# Patient Record
Sex: Female | Born: 1940
Health system: Southern US, Community
[De-identification: ages and names within clinical notes are randomized; demographics above are authoritative.]

## PROBLEM LIST (undated history)

## (undated) ENCOUNTER — Emergency Department (HOSPITAL_COMMUNITY)

## (undated) DIAGNOSIS — Z973 Presence of spectacles and contact lenses: Secondary | ICD-10-CM

## (undated) DIAGNOSIS — R112 Nausea with vomiting, unspecified: Secondary | ICD-10-CM

## (undated) DIAGNOSIS — T8859XA Other complications of anesthesia, initial encounter: Secondary | ICD-10-CM

## (undated) DIAGNOSIS — Z972 Presence of dental prosthetic device (complete) (partial): Secondary | ICD-10-CM

## (undated) DIAGNOSIS — M1990 Unspecified osteoarthritis, unspecified site: Secondary | ICD-10-CM

## (undated) DIAGNOSIS — T4145XA Adverse effect of unspecified anesthetic, initial encounter: Secondary | ICD-10-CM

## (undated) DIAGNOSIS — Z9889 Other specified postprocedural states: Secondary | ICD-10-CM

## (undated) DIAGNOSIS — I10 Essential (primary) hypertension: Secondary | ICD-10-CM

## (undated) HISTORY — PX: ABDOMINAL HYSTERECTOMY: SHX81

## (undated) HISTORY — PX: MULTIPLE TOOTH EXTRACTIONS: SHX2053

## (undated) HISTORY — PX: CHOLECYSTECTOMY: SHX55

## (undated) HISTORY — PX: DILATION AND CURETTAGE OF UTERUS: SHX78

## (undated) HISTORY — PX: APPENDECTOMY: SHX54

## (undated) HISTORY — PX: CATARACT EXTRACTION W/ INTRAOCULAR LENS  IMPLANT, BILATERAL: SHX1307

---

## 1998-10-29 ENCOUNTER — Ambulatory Visit: Admission: RE | Admit: 1998-10-29 | Discharge: 1998-10-29 | Payer: Self-pay | Admitting: *Deleted

## 1998-10-29 ENCOUNTER — Encounter: Payer: Self-pay | Admitting: *Deleted

## 1998-11-09 ENCOUNTER — Encounter: Admission: RE | Admit: 1998-11-09 | Discharge: 1998-11-19 | Payer: Self-pay | Admitting: *Deleted

## 1999-08-23 ENCOUNTER — Encounter: Payer: Self-pay | Admitting: Obstetrics and Gynecology

## 1999-08-23 ENCOUNTER — Encounter: Admission: RE | Admit: 1999-08-23 | Discharge: 1999-08-23 | Payer: Self-pay | Admitting: Obstetrics and Gynecology

## 1999-08-26 ENCOUNTER — Other Ambulatory Visit: Admission: RE | Admit: 1999-08-26 | Discharge: 1999-08-26 | Payer: Self-pay | Admitting: Obstetrics and Gynecology

## 2000-08-23 ENCOUNTER — Encounter: Admission: RE | Admit: 2000-08-23 | Discharge: 2000-08-23 | Payer: Self-pay | Admitting: Obstetrics and Gynecology

## 2000-08-23 ENCOUNTER — Encounter: Payer: Self-pay | Admitting: Obstetrics and Gynecology

## 2000-08-24 ENCOUNTER — Other Ambulatory Visit: Admission: RE | Admit: 2000-08-24 | Discharge: 2000-08-24 | Payer: Self-pay | Admitting: Obstetrics and Gynecology

## 2001-10-10 ENCOUNTER — Other Ambulatory Visit: Admission: RE | Admit: 2001-10-10 | Discharge: 2001-10-10 | Payer: Self-pay | Admitting: *Deleted

## 2002-07-19 ENCOUNTER — Ambulatory Visit (HOSPITAL_COMMUNITY): Admission: RE | Admit: 2002-07-19 | Discharge: 2002-07-19 | Payer: Self-pay | Admitting: Family Medicine

## 2008-06-30 ENCOUNTER — Emergency Department (HOSPITAL_COMMUNITY): Admission: EM | Admit: 2008-06-30 | Discharge: 2008-06-30 | Payer: Self-pay | Admitting: Emergency Medicine

## 2009-07-22 DIAGNOSIS — M129 Arthropathy, unspecified: Secondary | ICD-10-CM | POA: Insufficient documentation

## 2009-07-22 DIAGNOSIS — K829 Disease of gallbladder, unspecified: Secondary | ICD-10-CM | POA: Insufficient documentation

## 2009-07-22 DIAGNOSIS — I10 Essential (primary) hypertension: Secondary | ICD-10-CM | POA: Insufficient documentation

## 2010-01-08 ENCOUNTER — Encounter: Admission: RE | Admit: 2010-01-08 | Discharge: 2010-01-08 | Payer: Self-pay | Admitting: Internal Medicine

## 2013-03-21 ENCOUNTER — Emergency Department (HOSPITAL_COMMUNITY)
Admission: EM | Admit: 2013-03-21 | Discharge: 2013-03-21 | Disposition: A | Discharge status: Home / Self Care | Payer: Medicare Other | Attending: Emergency Medicine | Admitting: Emergency Medicine

## 2013-03-21 ENCOUNTER — Emergency Department (HOSPITAL_COMMUNITY): Payer: Medicare Other

## 2013-03-21 ENCOUNTER — Encounter (HOSPITAL_COMMUNITY): Payer: Self-pay

## 2013-03-21 DIAGNOSIS — Y9289 Other specified places as the place of occurrence of the external cause: Secondary | ICD-10-CM | POA: Insufficient documentation

## 2013-03-21 DIAGNOSIS — IMO0002 Reserved for concepts with insufficient information to code with codable children: Secondary | ICD-10-CM | POA: Insufficient documentation

## 2013-03-21 DIAGNOSIS — Z87891 Personal history of nicotine dependence: Secondary | ICD-10-CM | POA: Insufficient documentation

## 2013-03-21 DIAGNOSIS — I10 Essential (primary) hypertension: Secondary | ICD-10-CM | POA: Insufficient documentation

## 2013-03-21 DIAGNOSIS — S76312A Strain of muscle, fascia and tendon of the posterior muscle group at thigh level, left thigh, initial encounter: Secondary | ICD-10-CM

## 2013-03-21 DIAGNOSIS — X500XXA Overexertion from strenuous movement or load, initial encounter: Secondary | ICD-10-CM | POA: Insufficient documentation

## 2013-03-21 DIAGNOSIS — Z79899 Other long term (current) drug therapy: Secondary | ICD-10-CM | POA: Insufficient documentation

## 2013-03-21 DIAGNOSIS — Y9389 Activity, other specified: Secondary | ICD-10-CM | POA: Insufficient documentation

## 2013-03-21 HISTORY — DX: Essential (primary) hypertension: I10

## 2013-03-21 MED ORDER — HYDROCODONE-ACETAMINOPHEN 5-325 MG PO TABS: 1.0000 | ORAL_TABLET | Freq: Four times a day (QID) | ORAL | Status: DC | PRN

## 2013-03-21 NOTE — ED Notes (Signed)
Patient transported to X-ray 

## 2013-03-21 NOTE — ED Notes (Addendum)
Pt c/o L hip pain.  Denies injury.  Sts she was moving something on her porch and felt a "pop in the lower part of hip."  Pain score 8/10.  Vitals are stable.  NAD noted.  Pt ambulated to room w/o difficulty.

## 2013-03-21 NOTE — ED Notes (Signed)
It is noted that the area which the Pt is c/o pain is hamstring/back of thigh area.

## 2013-03-21 NOTE — ED Notes (Signed)
Pt escorted to discharge window. Verbalized understanding discharge instructions. In no acute distress.   

## 2013-03-21 NOTE — Progress Notes (Signed)
Pt confirms pcp is Barbara Soto EPIC updated

## 2013-03-21 NOTE — ED Provider Notes (Signed)
CSN: [id number]     Arrival date & time 03/21/13  1109 History     First MD Initiated Contact with Patient 03/21/13 1119     Chief Complaint  Patient presents with  . Hip Pain   (Consider location/radiation/quality/duration/timing/severity/associated sxs/prior Treatment) HPI This is a 72 year old female with a history of hypertension who presents with left leg pain. The patient cleans houses for a living. She states yesterday she was in her backyard when she bent over and lifted a heavy bucket.  She reports feeling a "pop" in her left leg. Patient has been ambulatory. She took Aleve last night with some improvement of her pain. Patient denies any other injury at this time. She denies any fevers, chest pain, shortness of breath, abdominal pain, urinary symptoms, focal weakness or numbness.  She denies other injury. Past Medical History  Diagnosis Date  . Hypertension    Past Surgical History  Procedure Laterality Date  . Abdominal hysterectomy    . Cholecystectomy     History reviewed. No pertinent family history. History  Substance Use Topics  . Smoking status: Former Games developer  . Smokeless tobacco: Not on file  . Alcohol Use: No   OB History   Grav Para Term Preterm Abortions TAB SAB Ect Mult Living                 Review of Systems  Constitutional: Negative for fever.  Respiratory: Negative for shortness of breath.   Cardiovascular: Negative for chest pain.  Gastrointestinal: Negative for abdominal pain.  Genitourinary: Negative for dysuria.  Musculoskeletal: Negative for back pain.       Pain to the posterior upper leg  Skin: Negative for wound.  Neurological: Negative for headaches.  All other systems reviewed and are negative.    Allergies  Review of patient's allergies indicates no known allergies.  Home Medications   Current Outpatient Rx  Name  Route  Sig  Dispense  Refill  . ibuprofen (ADVIL,MOTRIN) 200 MG tablet   Oral   Take 400 mg by mouth every 8  (eight) hours as needed for pain.         Marland Kitchen lisinopril (PRINIVIL,ZESTRIL) 10 MG tablet   Oral   Take 10 mg by mouth as directed. Written daily, but patient says she takes it "a couple times a week".         Marland Kitchen HYDROcodone-acetaminophen (NORCO/VICODIN) 5-325 MG per tablet   Oral   Take 1 tablet by mouth every 6 (six) hours as needed for pain.   10 tablet   0    BP 159/64  Pulse 77  Temp(Src) 98.5 F (36.9 C)  Resp 16  SpO2 98% Physical Exam  Nursing note and vitals reviewed. Constitutional: She is oriented to person, place, and time. She appears well-developed and well-nourished. No distress.  HENT:  Head: Normocephalic and atraumatic.  Neck: Neck supple.  Cardiovascular: Normal rate, regular rhythm and normal heart sounds.   Pulmonary/Chest: Effort normal and breath sounds normal. No respiratory distress.  Abdominal: Soft. Bowel sounds are normal. There is no tenderness.  Musculoskeletal:  Focused examination of the left lower ext reveals no obvious deformity.  There is no leg shortening. Patient has tenderness to palpation over the hamstring without mass noted. Patient is able to fire her quad. Has full range of motion at the hip and the knee.  Neurovascularly intact distally.  Neurological: She is alert and oriented to person, place, and time.  Skin: Skin is warm and dry.  Psychiatric: She has a normal mood and affect.    ED Course   Procedures (including critical care time)  Labs Reviewed - No data to display Dg Hip Complete Left  03/21/2013   *RADIOLOGY REPORT*  Clinical Data: Pain  LEFT HIP - COMPLETE 2+ VIEW  Comparison: None.  Findings: Frontal pelvis as well as frontal and lateral hip images were obtained.  There is moderate symmetric narrowing both hip joints.  No fracture or dislocation.  No erosive change.  IMPRESSION:   Moderate osteoarthritic change in both hip joints. No fracture or dislocation.   Original Report Authenticated By: Bretta Bang, M.D.    Dg Femur Left  03/21/2013   *RADIOLOGY REPORT*  Clinical Data: Felt pop yesterday.  Left hip pain extending down the left thigh.  LEFT FEMUR - 2 VIEW  Comparison: None.  Findings: No fracture or dislocation.  Hip joint and the left knee degenerative changes.  Evaluation slightly limited by patient's habitus.  IMPRESSION: No fracture.  Please see above.   Original Report Authenticated By: Lacy Duverney, M.D.   1. Hamstring muscle strain, left, initial encounter     MDM  This is a 72 year old female who presents with left leg pain. She is nontoxic-appearing on exam and vital signs are notable for a blood pressure 159/64. Patient has full range of motion of the left lower extremity at the hip and the knee. She has tenderness to palpation over the hamstring without mass. I suspect this is a muscle strain given the mechanism of injury. Plain films were obtained to rule out fracture. Clear negative. The patient will be given a short course of Norco for pain management at home. She takes Aleve when necessary and was encouraged to continue this for anti-inflammatory effect. She was given precaution given her age that NSAID use can cause GI upset , GI bleed, or renal impairment. She stated understanding. The patient does not have a primary care physician and does not wish to followup with one. She was given the contact information for the ER she is to have any further issues. The patient was also given exercises and stretches for rehabilitation purposes.  After history, exam, and medical workup I feel the patient has been appropriately medically screened and is safe for discharge home. Pertinent diagnoses were discussed with the patient. Patient was given return precautions.   Shon Baton, MD 03/21/13 [phone]

## 2013-03-21 NOTE — ED Notes (Signed)
MD at bedside. 

## 2013-06-06 ENCOUNTER — Other Ambulatory Visit: Payer: Self-pay | Admitting: Nurse Practitioner

## 2013-06-06 ENCOUNTER — Ambulatory Visit
Admission: RE | Admit: 2013-06-06 | Discharge: 2013-06-06 | Disposition: A | Discharge status: Home / Self Care | Payer: Medicare Other | Source: Ambulatory Visit | Attending: Nurse Practitioner | Admitting: Nurse Practitioner

## 2013-06-06 DIAGNOSIS — R05 Cough: Secondary | ICD-10-CM

## 2013-07-12 ENCOUNTER — Other Ambulatory Visit: Payer: Self-pay | Admitting: Orthopedic Surgery

## 2013-07-12 DIAGNOSIS — M545 Low back pain: Secondary | ICD-10-CM

## 2013-07-12 DIAGNOSIS — M25551 Pain in right hip: Secondary | ICD-10-CM

## 2013-07-20 ENCOUNTER — Ambulatory Visit
Admission: RE | Admit: 2013-07-20 | Discharge: 2013-07-20 | Disposition: A | Discharge status: Home / Self Care | Payer: Medicare Other | Source: Ambulatory Visit | Attending: Orthopedic Surgery | Admitting: Orthopedic Surgery

## 2013-07-20 DIAGNOSIS — M25551 Pain in right hip: Secondary | ICD-10-CM

## 2013-07-20 DIAGNOSIS — M545 Low back pain: Secondary | ICD-10-CM

## 2013-10-28 ENCOUNTER — Ambulatory Visit
Admission: RE | Admit: 2013-10-28 | Discharge: 2013-10-28 | Disposition: A | Discharge status: Home / Self Care | Payer: Medicare Other | Source: Ambulatory Visit | Attending: Nurse Practitioner | Admitting: Nurse Practitioner

## 2013-10-28 ENCOUNTER — Other Ambulatory Visit: Payer: Self-pay | Admitting: Nurse Practitioner

## 2013-10-28 DIAGNOSIS — M2560 Stiffness of unspecified joint, not elsewhere classified: Secondary | ICD-10-CM

## 2014-03-11 ENCOUNTER — Ambulatory Visit: Payer: Self-pay | Admitting: Gynecology

## 2015-08-18 DIAGNOSIS — D485 Neoplasm of uncertain behavior of skin: Secondary | ICD-10-CM | POA: Diagnosis not present

## 2015-08-18 DIAGNOSIS — L820 Inflamed seborrheic keratosis: Secondary | ICD-10-CM | POA: Diagnosis not present

## 2015-08-18 DIAGNOSIS — B079 Viral wart, unspecified: Secondary | ICD-10-CM | POA: Diagnosis not present

## 2015-09-23 DIAGNOSIS — H1013 Acute atopic conjunctivitis, bilateral: Secondary | ICD-10-CM | POA: Diagnosis not present

## 2015-09-23 DIAGNOSIS — H35373 Puckering of macula, bilateral: Secondary | ICD-10-CM | POA: Diagnosis not present

## 2015-09-23 DIAGNOSIS — H524 Presbyopia: Secondary | ICD-10-CM | POA: Diagnosis not present

## 2015-09-23 DIAGNOSIS — Z961 Presence of intraocular lens: Secondary | ICD-10-CM | POA: Diagnosis not present

## 2015-09-23 DIAGNOSIS — H35362 Drusen (degenerative) of macula, left eye: Secondary | ICD-10-CM | POA: Diagnosis not present

## 2015-10-19 DIAGNOSIS — Z1231 Encounter for screening mammogram for malignant neoplasm of breast: Secondary | ICD-10-CM | POA: Diagnosis not present

## 2015-12-01 DIAGNOSIS — Z0000 Encounter for general adult medical examination without abnormal findings: Secondary | ICD-10-CM | POA: Diagnosis not present

## 2015-12-02 DIAGNOSIS — R7301 Impaired fasting glucose: Secondary | ICD-10-CM | POA: Diagnosis not present

## 2015-12-02 DIAGNOSIS — Z136 Encounter for screening for cardiovascular disorders: Secondary | ICD-10-CM | POA: Diagnosis not present

## 2015-12-02 DIAGNOSIS — R8290 Unspecified abnormal findings in urine: Secondary | ICD-10-CM | POA: Diagnosis not present

## 2016-05-25 DIAGNOSIS — E113291 Type 2 diabetes mellitus with mild nonproliferative diabetic retinopathy without macular edema, right eye: Secondary | ICD-10-CM | POA: Diagnosis not present

## 2016-05-25 DIAGNOSIS — E113212 Type 2 diabetes mellitus with mild nonproliferative diabetic retinopathy with macular edema, left eye: Secondary | ICD-10-CM | POA: Diagnosis not present

## 2016-05-25 DIAGNOSIS — H401131 Primary open-angle glaucoma, bilateral, mild stage: Secondary | ICD-10-CM | POA: Diagnosis not present

## 2016-06-02 DIAGNOSIS — M545 Low back pain: Secondary | ICD-10-CM | POA: Diagnosis not present

## 2016-06-02 DIAGNOSIS — Z1211 Encounter for screening for malignant neoplasm of colon: Secondary | ICD-10-CM | POA: Diagnosis not present

## 2016-06-02 DIAGNOSIS — G4700 Insomnia, unspecified: Secondary | ICD-10-CM | POA: Diagnosis not present

## 2016-06-02 DIAGNOSIS — M159 Polyosteoarthritis, unspecified: Secondary | ICD-10-CM | POA: Diagnosis not present

## 2016-06-02 DIAGNOSIS — I10 Essential (primary) hypertension: Secondary | ICD-10-CM | POA: Diagnosis not present

## 2016-06-02 DIAGNOSIS — Z1212 Encounter for screening for malignant neoplasm of rectum: Secondary | ICD-10-CM | POA: Diagnosis not present

## 2016-07-04 ENCOUNTER — Other Ambulatory Visit: Payer: Self-pay | Admitting: Pharmacist Clinician (PhC)/ Clinical Pharmacy Specialist

## 2016-08-22 DIAGNOSIS — L309 Dermatitis, unspecified: Secondary | ICD-10-CM | POA: Diagnosis not present

## 2016-08-22 DIAGNOSIS — L720 Epidermal cyst: Secondary | ICD-10-CM | POA: Diagnosis not present

## 2016-10-19 DIAGNOSIS — Z1231 Encounter for screening mammogram for malignant neoplasm of breast: Secondary | ICD-10-CM | POA: Diagnosis not present

## 2016-10-25 DIAGNOSIS — G894 Chronic pain syndrome: Secondary | ICD-10-CM | POA: Diagnosis not present

## 2016-10-25 DIAGNOSIS — G4700 Insomnia, unspecified: Secondary | ICD-10-CM | POA: Diagnosis not present

## 2016-10-25 DIAGNOSIS — I10 Essential (primary) hypertension: Secondary | ICD-10-CM | POA: Diagnosis not present

## 2016-10-25 DIAGNOSIS — M159 Polyosteoarthritis, unspecified: Secondary | ICD-10-CM | POA: Diagnosis not present

## 2016-10-25 DIAGNOSIS — R7301 Impaired fasting glucose: Secondary | ICD-10-CM | POA: Diagnosis not present

## 2016-11-03 DIAGNOSIS — H26493 Other secondary cataract, bilateral: Secondary | ICD-10-CM | POA: Diagnosis not present

## 2016-11-03 DIAGNOSIS — H35373 Puckering of macula, bilateral: Secondary | ICD-10-CM | POA: Diagnosis not present

## 2016-11-03 DIAGNOSIS — H35362 Drusen (degenerative) of macula, left eye: Secondary | ICD-10-CM | POA: Diagnosis not present

## 2016-11-03 DIAGNOSIS — Z961 Presence of intraocular lens: Secondary | ICD-10-CM | POA: Diagnosis not present

## 2016-12-14 ENCOUNTER — Encounter: Payer: Self-pay | Admitting: Gynecology

## 2017-02-13 DIAGNOSIS — M25511 Pain in right shoulder: Secondary | ICD-10-CM | POA: Diagnosis not present

## 2017-02-20 DIAGNOSIS — R0781 Pleurodynia: Secondary | ICD-10-CM | POA: Diagnosis not present

## 2017-03-06 ENCOUNTER — Other Ambulatory Visit: Payer: Self-pay | Admitting: Family Medicine

## 2017-03-06 DIAGNOSIS — R0781 Pleurodynia: Secondary | ICD-10-CM

## 2017-03-20 DIAGNOSIS — M25551 Pain in right hip: Secondary | ICD-10-CM | POA: Diagnosis not present

## 2017-03-27 DIAGNOSIS — D229 Melanocytic nevi, unspecified: Secondary | ICD-10-CM | POA: Diagnosis not present

## 2017-03-27 DIAGNOSIS — L821 Other seborrheic keratosis: Secondary | ICD-10-CM | POA: Diagnosis not present

## 2017-03-27 DIAGNOSIS — L720 Epidermal cyst: Secondary | ICD-10-CM | POA: Diagnosis not present

## 2017-04-11 DIAGNOSIS — M25551 Pain in right hip: Secondary | ICD-10-CM | POA: Diagnosis not present

## 2017-04-27 DIAGNOSIS — L720 Epidermal cyst: Secondary | ICD-10-CM | POA: Diagnosis not present

## 2017-06-06 DIAGNOSIS — M25512 Pain in left shoulder: Secondary | ICD-10-CM | POA: Diagnosis not present

## 2017-07-19 DIAGNOSIS — M1611 Unilateral primary osteoarthritis, right hip: Secondary | ICD-10-CM | POA: Diagnosis not present

## 2017-08-09 ENCOUNTER — Other Ambulatory Visit: Payer: Self-pay | Admitting: Orthopaedic Surgery

## 2017-08-14 ENCOUNTER — Other Ambulatory Visit: Payer: Self-pay | Admitting: Orthopaedic Surgery

## 2017-08-23 NOTE — H&P (Signed)
TOTAL HIP ADMISSION H&P  Patient is admitted for right total hip arthroplasty.  Subjective:  Chief Complaint: right hip pain  HPI: Barbara Soto, 77 y.o. female, has a history of pain and functional disability in the right hip(s) due to arthritis and patient has failed non-surgical conservative treatments for greater than 12 weeks to include NSAID's and/or analgesics, corticosteriod injections, flexibility and strengthening excercises, use of assistive devices, weight reduction as appropriate and activity modification.  Onset of symptoms was gradual starting 5 years ago with gradually worsening course since that time.The patient noted no past surgery on the right hip(s).  Patient currently rates pain in the right hip at 10 out of 10 with activity. Patient has night pain, worsening of pain with activity and weight bearing, trendelenberg gait, pain that interfers with activities of daily living and crepitus. Patient has evidence of subchondral cysts, subchondral sclerosis, periarticular osteophytes and joint space narrowing by imaging studies. This condition presents safety issues increasing the risk of falls. There is no current active infection.  Patient Active Problem List   Diagnosis Date Noted  . HYPERTENSION 07/22/2009  . GALLBLADDER DISEASE 07/22/2009  . ARTHRITIS 07/22/2009   Past Medical History:  Diagnosis Date  . Hypertension     Past Surgical History:  Procedure Laterality Date  . ABDOMINAL HYSTERECTOMY    . CHOLECYSTECTOMY      No current facility-administered medications for this encounter.    Current Outpatient Medications  Medication Sig Dispense Refill Last Dose  . aspirin EC 81 MG tablet Take 81 mg by mouth daily.     Marland Kitchen lisinopril (PRINIVIL,ZESTRIL) 10 MG tablet Take 10 mg by mouth as directed. Written daily, but patient says she takes it "a couple times a week".   Past Week at Unknown  . naproxen sodium (ALEVE) 220 MG tablet Take 440 mg by mouth 3 (three) times daily  as needed (pain).     Marland Kitchen HYDROcodone-acetaminophen (NORCO/VICODIN) 5-325 MG per tablet Take 1 tablet by mouth every 6 (six) hours as needed for pain. (Patient not taking: Reported on 08/21/2017) 10 tablet 0 Not Taking at Unknown time  . ibuprofen (ADVIL,MOTRIN) 200 MG tablet Take 400 mg by mouth every 8 (eight) hours as needed for pain.   03/20/2013 at Unknown   Allergies  Allergen Reactions  . Adhesive [Tape] Other (See Comments)    Takes skin off   . Penicillins Rash    Has patient had a PCN reaction causing immediate rash, facial/tongue/throat swelling, SOB or lightheadedness with hypotension: Yes Has patient had a PCN reaction causing severe rash involving mucus membranes or skin necrosis: No Has patient had a PCN reaction that required hospitalization: No Has patient had a PCN reaction occurring within the last 10 years: No If all of the above answers are "NO", then may proceed with Cephalosporin use.     Social History   Tobacco Use  . Smoking status: Former Smoker  Substance Use Topics  . Alcohol use: No    No family history on file.   Review of Systems  Musculoskeletal: Positive for joint pain.       Right hip  All other systems reviewed and are negative.   Objective:  Physical Exam  Constitutional: She is oriented to person, place, and time. She appears well-developed and well-nourished.  HENT:  Head: Normocephalic and atraumatic.  Eyes: Pupils are equal, round, and reactive to light.  Neck: Normal range of motion.  Cardiovascular: Normal rate and regular rhythm.  Respiratory: Effort  normal.  GI: Soft.  Musculoskeletal:  Right hip motion is minimal and extremely painful.  Her leg looks a little short compared to the opposite side.  She walks with a markedly altered gait.  Her right knee moves fully with no effusion and no joint line pain.  Sensation and motor function are intact in her feet with palpable pulses on both sides.   Neurological: She is alert and oriented  to person, place, and time.  Skin: Skin is warm and dry.  Psychiatric: She has a normal mood and affect. Her behavior is normal. Judgment and thought content normal.    Vital signs in last 24 hours: BP: ()/()  Arterial Line BP: ()/()   Labs:   There is no height or weight on file to calculate BMI.   Imaging Review Plain radiographs demonstrate severe degenerative joint disease of the right hip(s). The bone quality appears to be good for age and reported activity level.  Assessment/Plan:  End stage primary arthritis, right hip(s)  The patient history, physical examination, clinical judgement of the provider and imaging studies are consistent with end stage degenerative joint disease of the right hip(s) and total hip arthroplasty is deemed medically necessary. The treatment options including medical management, injection therapy, arthroscopy and arthroplasty were discussed at length. The risks and benefits of total hip arthroplasty were presented and reviewed. The risks due to aseptic loosening, infection, stiffness, dislocation/subluxation,  thromboembolic complications and other imponderables were discussed.  The patient acknowledged the explanation, agreed to proceed with the plan and consent was signed. Patient is being admitted for inpatient treatment for surgery, pain control, PT, OT, prophylactic antibiotics, VTE prophylaxis, progressive ambulation and ADL's and discharge planning.The patient is planning to be discharged home with home health services

## 2017-08-28 ENCOUNTER — Ambulatory Visit (HOSPITAL_COMMUNITY)
Admission: RE | Admit: 2017-08-28 | Discharge: 2017-08-28 | Disposition: A | Discharge status: Home / Self Care | Payer: PPO | Source: Ambulatory Visit | Attending: Orthopaedic Surgery | Admitting: Orthopaedic Surgery

## 2017-08-28 ENCOUNTER — Encounter (HOSPITAL_COMMUNITY): Payer: Self-pay

## 2017-08-28 ENCOUNTER — Encounter (HOSPITAL_COMMUNITY)
Admission: RE | Admit: 2017-08-28 | Discharge: 2017-08-28 | Disposition: A | Discharge status: Home / Self Care | Payer: PPO | Source: Ambulatory Visit | Attending: Orthopaedic Surgery | Admitting: Orthopaedic Surgery

## 2017-08-28 ENCOUNTER — Other Ambulatory Visit: Payer: Self-pay

## 2017-08-28 DIAGNOSIS — Z7982 Long term (current) use of aspirin: Secondary | ICD-10-CM | POA: Diagnosis not present

## 2017-08-28 DIAGNOSIS — K829 Disease of gallbladder, unspecified: Secondary | ICD-10-CM | POA: Diagnosis not present

## 2017-08-28 DIAGNOSIS — Z01810 Encounter for preprocedural cardiovascular examination: Secondary | ICD-10-CM | POA: Insufficient documentation

## 2017-08-28 DIAGNOSIS — Z96641 Presence of right artificial hip joint: Secondary | ICD-10-CM | POA: Diagnosis not present

## 2017-08-28 DIAGNOSIS — M1611 Unilateral primary osteoarthritis, right hip: Secondary | ICD-10-CM

## 2017-08-28 DIAGNOSIS — Z01818 Encounter for other preprocedural examination: Secondary | ICD-10-CM

## 2017-08-28 DIAGNOSIS — Z880 Allergy status to penicillin: Secondary | ICD-10-CM | POA: Diagnosis not present

## 2017-08-28 DIAGNOSIS — Z01812 Encounter for preprocedural laboratory examination: Secondary | ICD-10-CM | POA: Insufficient documentation

## 2017-08-28 DIAGNOSIS — I444 Left anterior fascicular block: Secondary | ICD-10-CM

## 2017-08-28 DIAGNOSIS — I10 Essential (primary) hypertension: Secondary | ICD-10-CM

## 2017-08-28 DIAGNOSIS — Z96649 Presence of unspecified artificial hip joint: Secondary | ICD-10-CM | POA: Diagnosis not present

## 2017-08-28 DIAGNOSIS — D62 Acute posthemorrhagic anemia: Secondary | ICD-10-CM | POA: Diagnosis not present

## 2017-08-28 DIAGNOSIS — Z87891 Personal history of nicotine dependence: Secondary | ICD-10-CM | POA: Diagnosis not present

## 2017-08-28 DIAGNOSIS — Z471 Aftercare following joint replacement surgery: Secondary | ICD-10-CM | POA: Diagnosis not present

## 2017-08-28 DIAGNOSIS — Z91048 Other nonmedicinal substance allergy status: Secondary | ICD-10-CM | POA: Diagnosis not present

## 2017-08-28 DIAGNOSIS — Z79899 Other long term (current) drug therapy: Secondary | ICD-10-CM | POA: Diagnosis not present

## 2017-08-28 HISTORY — DX: Presence of dental prosthetic device (complete) (partial): Z97.2

## 2017-08-28 HISTORY — DX: Unspecified osteoarthritis, unspecified site: M19.90

## 2017-08-28 HISTORY — DX: Presence of spectacles and contact lenses: Z97.3

## 2017-08-28 LAB — CBC WITH DIFFERENTIAL/PLATELET
BASOS ABS: 0.1 10*3/uL (ref 0.0–0.1)
BASOS PCT: 1 %
Eosinophils Absolute: 0.4 10*3/uL (ref 0.0–0.7)
Eosinophils Relative: 5 %
HEMATOCRIT: 41.1 % (ref 36.0–46.0)
HEMOGLOBIN: 13.6 g/dL (ref 12.0–15.0)
LYMPHS PCT: 28 %
Lymphs Abs: 2.3 10*3/uL (ref 0.7–4.0)
MCH: 30 pg (ref 26.0–34.0)
MCHC: 33.1 g/dL (ref 30.0–36.0)
MCV: 90.7 fL (ref 78.0–100.0)
MONOS PCT: 5 %
Monocytes Absolute: 0.4 10*3/uL (ref 0.1–1.0)
NEUTROS ABS: 5.2 10*3/uL (ref 1.7–7.7)
NEUTROS PCT: 61 %
Platelets: 276 10*3/uL (ref 150–400)
RBC: 4.53 MIL/uL (ref 3.87–5.11)
RDW: 13.6 % (ref 11.5–15.5)
WBC: 8.4 10*3/uL (ref 4.0–10.5)

## 2017-08-28 LAB — PROTIME-INR
INR: 1.07
Prothrombin Time: 13.8 seconds (ref 11.4–15.2)

## 2017-08-28 LAB — URINALYSIS, ROUTINE W REFLEX MICROSCOPIC
Bacteria, UA: NONE SEEN
Bilirubin Urine: NEGATIVE
Glucose, UA: NEGATIVE mg/dL
Hgb urine dipstick: NEGATIVE
Ketones, ur: NEGATIVE mg/dL
Nitrite: NEGATIVE
PH: 7 (ref 5.0–8.0)
Protein, ur: NEGATIVE mg/dL
SPECIFIC GRAVITY, URINE: 1.013 (ref 1.005–1.030)

## 2017-08-28 LAB — BASIC METABOLIC PANEL
ANION GAP: 13 (ref 5–15)
BUN: 12 mg/dL (ref 6–20)
CO2: 22 mmol/L (ref 22–32)
Calcium: 9.3 mg/dL (ref 8.9–10.3)
Chloride: 106 mmol/L (ref 101–111)
Creatinine, Ser: 1.04 mg/dL — ABNORMAL HIGH (ref 0.44–1.00)
GFR calc non Af Amer: 51 mL/min — ABNORMAL LOW (ref >60)
GFR, EST AFRICAN AMERICAN: 59 mL/min — AB (ref >60)
GLUCOSE: 108 mg/dL — AB (ref 65–99)
POTASSIUM: 4.5 mmol/L (ref 3.5–5.1)
Sodium: 141 mmol/L (ref 135–145)

## 2017-08-28 LAB — SURGICAL PCR SCREEN
MRSA, PCR: NEGATIVE
Staphylococcus aureus: POSITIVE — AB

## 2017-08-28 LAB — TYPE AND SCREEN
ABO/RH(D): O POS
Antibody Screen: NEGATIVE

## 2017-08-28 LAB — APTT: aPTT: 32 seconds (ref 24–36)

## 2017-08-28 LAB — ABO/RH: ABO/RH(D): O POS

## 2017-08-28 MED ORDER — TRANEXAMIC ACID 1000 MG/10ML IV SOLN
1000.0000 mg | INTRAVENOUS | Status: AC
  Administered 2017-08-29: 1000 mg via INTRAVENOUS
  Filled 2017-08-28: qty 1100

## 2017-08-28 MED ORDER — LACTATED RINGERS IV SOLN
INTRAVENOUS | Status: DC
  Administered 2017-08-29 (×3): via INTRAVENOUS

## 2017-08-28 MED ORDER — TRANEXAMIC ACID 1000 MG/10ML IV SOLN
2000.0000 mg | INTRAVENOUS | Status: AC
  Administered 2017-08-29: 2000 mg via TOPICAL
  Filled 2017-08-28: qty 20

## 2017-08-28 NOTE — Progress Notes (Signed)
Pt denies SOB, chest pain, and being under the care of a cardiologist. Pt denies having an echo and cardiac cath but stated that a stress test was performed in the "1980's. Pt stated that her last EKG was performed 25 years ago and pt denies having a chest x ray within the last year. Pt denies recent labs. Anesthesia reviewed pt EKG; no new orders given. Left voice message with Sandi Raveling, Surgical Coordinator, to make MD aware that pt UA was abnormal     ( moderate Leukocytes).

## 2017-08-28 NOTE — Pre-Procedure Instructions (Signed)
    FAE BLOSSOM  08/28/2017      [id number] [place], [place] - [address] [place] [address] Phone: [phone] Fax: [phone]    Your procedure is scheduled on Tuesday, August 29, 2017  Report to [facility] Admitting at 10:50 A.M.  Call this number if you have problems the morning of surgery:  [phone]   Remember:  Do not eat food or drink liquids after midnight.  Take these medicines the morning of surgery with A SIP OF WATER: None Stop taking Aspirin, vitamins, fish oil and herbal medications. Do not take any NSAIDs ie: Ibuprofen, Advil, Naproxen (Aleve), Motrin, B and Goody Powder; stop now.  Do not wear jewelry, make-up or nail polish.  Do not wear lotions, powders, or perfumes, or deodorant.  Do not shave 48 hours prior to surgery.    Do not bring valuables to the hospital.  [facility] is not responsible for any belongings or valuables.  Contacts, dentures or bridgework may not be worn into surgery.  Leave your suitcase in the car.  After surgery it may be brought to your room. For patients admitted to the hospital, discharge time will be determined by your treatment team. Special instructions: Shower the night before surgery and the morning of surgery with CHG. Please read over the following fact sheets that you were given. Pain Booklet, Coughing and Deep Breathing, Blood Transfusion Information, Total Joint Packet and Surgical Site Infection Prevention

## 2017-08-28 NOTE — Progress Notes (Signed)
Anesthesia Chart Review:  Pt is a 77 year old female scheduled for R total hip arthroplasty anterior approach on 08/29/2017 with Melrose Nakayama, MD  - No PCP  PMH includes:  HTN. Never smoker. BMI 37.5  Medications include: ASA 81mg , lisinopril (takes prn if having "a stressful day")  BP (!) 170/83   Pulse 93   Temp 36.4 C   Resp 20   Ht 5\' 2"  (1.575 m)   Wt 205 lb (93 kg)   SpO2 98%   BMI 37.49 kg/m   Preoperative labs reviewed.    CXR 08/28/17: No active cardiopulmonary disease.  EKG 08/28/17: Sinus rhythm with PACs. LAFB.  - Pt reports last EKG was 25 years ago.   Pt denied acute CV symptoms at pre-admission testing.   Reviewed case with Dr. Gifford Shave.   If no changes, I anticipate pt can proceed with surgery as scheduled.   Willeen Cass, FNP-BC [facility]/Anesthesiology Phone: [phone] 08/28/2017 3:51 PM

## 2017-08-29 ENCOUNTER — Encounter (HOSPITAL_COMMUNITY): Payer: Self-pay | Admitting: *Deleted

## 2017-08-29 ENCOUNTER — Encounter (HOSPITAL_COMMUNITY)
Admission: RE | Disposition: A | Discharge status: Home Health | Payer: Self-pay | Source: Ambulatory Visit | Attending: Orthopaedic Surgery

## 2017-08-29 ENCOUNTER — Inpatient Hospital Stay (HOSPITAL_COMMUNITY): Payer: PPO | Admitting: Vascular Surgery

## 2017-08-29 ENCOUNTER — Inpatient Hospital Stay (HOSPITAL_COMMUNITY)
Admission: RE | Admit: 2017-08-29 | Discharge: 2017-09-01 | DRG: 470 | Disposition: A | Discharge status: Home Health | Payer: PPO | Source: Ambulatory Visit | Attending: Orthopaedic Surgery | Admitting: Orthopaedic Surgery

## 2017-08-29 ENCOUNTER — Inpatient Hospital Stay (HOSPITAL_COMMUNITY): Payer: PPO

## 2017-08-29 DIAGNOSIS — Z880 Allergy status to penicillin: Secondary | ICD-10-CM

## 2017-08-29 DIAGNOSIS — Z91048 Other nonmedicinal substance allergy status: Secondary | ICD-10-CM | POA: Diagnosis not present

## 2017-08-29 DIAGNOSIS — M1611 Unilateral primary osteoarthritis, right hip: Principal | ICD-10-CM | POA: Diagnosis present

## 2017-08-29 DIAGNOSIS — Z7982 Long term (current) use of aspirin: Secondary | ICD-10-CM | POA: Diagnosis not present

## 2017-08-29 DIAGNOSIS — Z96641 Presence of right artificial hip joint: Secondary | ICD-10-CM

## 2017-08-29 DIAGNOSIS — D62 Acute posthemorrhagic anemia: Secondary | ICD-10-CM

## 2017-08-29 DIAGNOSIS — Z9889 Other specified postprocedural states: Secondary | ICD-10-CM

## 2017-08-29 DIAGNOSIS — Z419 Encounter for procedure for purposes other than remedying health state, unspecified: Secondary | ICD-10-CM

## 2017-08-29 DIAGNOSIS — Z87891 Personal history of nicotine dependence: Secondary | ICD-10-CM

## 2017-08-29 DIAGNOSIS — I10 Essential (primary) hypertension: Secondary | ICD-10-CM | POA: Diagnosis present

## 2017-08-29 DIAGNOSIS — Z79899 Other long term (current) drug therapy: Secondary | ICD-10-CM

## 2017-08-29 HISTORY — PX: TOTAL HIP ARTHROPLASTY: SHX124

## 2017-08-29 HISTORY — DX: Other specified postprocedural states: R11.2

## 2017-08-29 HISTORY — DX: Other complications of anesthesia, initial encounter: T88.59XA

## 2017-08-29 HISTORY — DX: Other specified postprocedural states: Z98.890

## 2017-08-29 HISTORY — DX: Adverse effect of unspecified anesthetic, initial encounter: T41.45XA

## 2017-08-29 SURGERY — ARTHROPLASTY, HIP, TOTAL, ANTERIOR APPROACH
Anesthesia: Spinal | Laterality: Right

## 2017-08-29 MED ORDER — ALBUMIN HUMAN 5 % IV SOLN
INTRAVENOUS | Status: AC
  Filled 2017-08-29: qty 250

## 2017-08-29 MED ORDER — BUPIVACAINE HCL (PF) 0.25 % IJ SOLN
INTRAMUSCULAR | Status: AC
  Filled 2017-08-29: qty 30

## 2017-08-29 MED ORDER — MEPERIDINE HCL 25 MG/ML IJ SOLN: 6.2500 mg | INTRAMUSCULAR | Status: DC | PRN

## 2017-08-29 MED ORDER — OXYCODONE HCL 5 MG PO TABS
5.0000 mg | ORAL_TABLET | ORAL | Status: DC | PRN
  Administered 2017-08-29 – 2017-08-30 (×2): 5 mg via ORAL
  Filled 2017-08-29 (×2): qty 1

## 2017-08-29 MED ORDER — OXYCODONE HCL 5 MG PO TABS
10.0000 mg | ORAL_TABLET | ORAL | Status: DC | PRN
  Administered 2017-08-29 – 2017-09-01 (×4): 10 mg via ORAL
  Filled 2017-08-29 (×4): qty 2

## 2017-08-29 MED ORDER — METOCLOPRAMIDE HCL 5 MG PO TABS: 5.0000 mg | ORAL_TABLET | Freq: Three times a day (TID) | ORAL | Status: DC | PRN

## 2017-08-29 MED ORDER — 0.9 % SODIUM CHLORIDE (POUR BTL) OPTIME
TOPICAL | Status: DC | PRN
  Administered 2017-08-29: 1000 mL

## 2017-08-29 MED ORDER — EPINEPHRINE PF 1 MG/ML IJ SOLN
INTRAMUSCULAR | Status: AC
  Filled 2017-08-29: qty 1

## 2017-08-29 MED ORDER — METHOCARBAMOL 1000 MG/10ML IJ SOLN
500.0000 mg | Freq: Four times a day (QID) | INTRAVENOUS | Status: DC | PRN
  Filled 2017-08-29: qty 5

## 2017-08-29 MED ORDER — PHENYLEPHRINE 40 MCG/ML (10ML) SYRINGE FOR IV PUSH (FOR BLOOD PRESSURE SUPPORT)
PREFILLED_SYRINGE | INTRAVENOUS | Status: DC | PRN
  Administered 2017-08-29: 80 ug via INTRAVENOUS
  Administered 2017-08-29: 40 ug via INTRAVENOUS
  Administered 2017-08-29: 80 ug via INTRAVENOUS
  Administered 2017-08-29 (×5): 40 ug via INTRAVENOUS

## 2017-08-29 MED ORDER — BISACODYL 5 MG PO TBEC: 5.0000 mg | DELAYED_RELEASE_TABLET | Freq: Every day | ORAL | Status: DC | PRN

## 2017-08-29 MED ORDER — METHOCARBAMOL 500 MG PO TABS
500.0000 mg | ORAL_TABLET | Freq: Four times a day (QID) | ORAL | Status: DC | PRN
  Administered 2017-08-29 – 2017-09-01 (×3): 500 mg via ORAL
  Filled 2017-08-29 (×2): qty 1

## 2017-08-29 MED ORDER — FENTANYL CITRATE (PF) 250 MCG/5ML IJ SOLN
INTRAMUSCULAR | Status: AC
  Filled 2017-08-29: qty 5

## 2017-08-29 MED ORDER — OXYCODONE HCL 5 MG PO TABS
ORAL_TABLET | ORAL | Status: AC
  Filled 2017-08-29: qty 1

## 2017-08-29 MED ORDER — HYDROMORPHONE HCL 1 MG/ML IJ SOLN
0.5000 mg | INTRAMUSCULAR | Status: DC | PRN
  Administered 2017-08-29 – 2017-08-30 (×4): 1 mg via INTRAVENOUS
  Filled 2017-08-29 (×4): qty 1

## 2017-08-29 MED ORDER — EPINEPHRINE PF 1 MG/ML IJ SOLN
INTRAMUSCULAR | Status: DC | PRN
  Administered 2017-08-29: 1 mg

## 2017-08-29 MED ORDER — DEXAMETHASONE SODIUM PHOSPHATE 10 MG/ML IJ SOLN
INTRAMUSCULAR | Status: DC | PRN
  Administered 2017-08-29: 10 mg via INTRAVENOUS

## 2017-08-29 MED ORDER — LACTATED RINGERS IV SOLN
INTRAVENOUS | Status: DC
  Administered 2017-08-29 – 2017-08-30 (×3): via INTRAVENOUS

## 2017-08-29 MED ORDER — ASPIRIN EC 325 MG PO TBEC
325.0000 mg | DELAYED_RELEASE_TABLET | Freq: Two times a day (BID) | ORAL | Status: DC
  Administered 2017-08-30 – 2017-09-01 (×5): 325 mg via ORAL
  Filled 2017-08-29 (×5): qty 1

## 2017-08-29 MED ORDER — BUPIVACAINE LIPOSOME 1.3 % IJ SUSP
20.0000 mL | INTRAMUSCULAR | Status: AC
  Administered 2017-08-29: 20 mL
  Filled 2017-08-29: qty 20

## 2017-08-29 MED ORDER — FENTANYL CITRATE (PF) 100 MCG/2ML IJ SOLN
INTRAMUSCULAR | Status: DC | PRN
  Administered 2017-08-29 (×2): 50 ug via INTRAVENOUS

## 2017-08-29 MED ORDER — PHENYLEPHRINE HCL 10 MG/ML IJ SOLN
INTRAVENOUS | Status: DC | PRN
  Administered 2017-08-29: 25 ug/min via INTRAVENOUS

## 2017-08-29 MED ORDER — CEFAZOLIN SODIUM-DEXTROSE 2-4 GM/100ML-% IV SOLN
2.0000 g | Freq: Four times a day (QID) | INTRAVENOUS | Status: AC
  Administered 2017-08-29 – 2017-08-30 (×2): 2 g via INTRAVENOUS
  Filled 2017-08-29 (×2): qty 100

## 2017-08-29 MED ORDER — BUPIVACAINE IN DEXTROSE 0.75-8.25 % IT SOLN
INTRATHECAL | Status: DC | PRN
  Administered 2017-08-29: 2 mL via INTRATHECAL

## 2017-08-29 MED ORDER — ALUM & MAG HYDROXIDE-SIMETH 200-200-20 MG/5ML PO SUSP: 30.0000 mL | ORAL | Status: DC | PRN

## 2017-08-29 MED ORDER — PHENYLEPHRINE 40 MCG/ML (10ML) SYRINGE FOR IV PUSH (FOR BLOOD PRESSURE SUPPORT)
PREFILLED_SYRINGE | INTRAVENOUS | Status: AC
  Filled 2017-08-29: qty 10

## 2017-08-29 MED ORDER — ALBUMIN HUMAN 5 % IV SOLN
12.5000 g | Freq: Once | INTRAVENOUS | Status: AC
  Administered 2017-08-29: 12.5 g via INTRAVENOUS

## 2017-08-29 MED ORDER — ACETAMINOPHEN 325 MG PO TABS
650.0000 mg | ORAL_TABLET | ORAL | Status: DC | PRN
  Filled 2017-08-29: qty 2

## 2017-08-29 MED ORDER — METHOCARBAMOL 500 MG PO TABS
ORAL_TABLET | ORAL | Status: AC
  Filled 2017-08-29: qty 1

## 2017-08-29 MED ORDER — DEXAMETHASONE SODIUM PHOSPHATE 10 MG/ML IJ SOLN
INTRAMUSCULAR | Status: AC
  Filled 2017-08-29: qty 1

## 2017-08-29 MED ORDER — FENTANYL CITRATE (PF) 100 MCG/2ML IJ SOLN
25.0000 ug | INTRAMUSCULAR | Status: DC | PRN
  Administered 2017-08-29 (×2): 25 ug via INTRAVENOUS

## 2017-08-29 MED ORDER — METOCLOPRAMIDE HCL 5 MG/ML IJ SOLN
5.0000 mg | Freq: Three times a day (TID) | INTRAMUSCULAR | Status: DC | PRN
  Administered 2017-08-29: 10 mg via INTRAVENOUS
  Filled 2017-08-29: qty 2

## 2017-08-29 MED ORDER — CEFAZOLIN SODIUM-DEXTROSE 2-4 GM/100ML-% IV SOLN
INTRAVENOUS | Status: AC
  Filled 2017-08-29: qty 100

## 2017-08-29 MED ORDER — FENTANYL CITRATE (PF) 100 MCG/2ML IJ SOLN
INTRAMUSCULAR | Status: AC
  Filled 2017-08-29: qty 2

## 2017-08-29 MED ORDER — ONDANSETRON HCL 4 MG/2ML IJ SOLN
INTRAMUSCULAR | Status: DC | PRN
  Administered 2017-08-29: 4 mg via INTRAVENOUS

## 2017-08-29 MED ORDER — DIPHENHYDRAMINE HCL 12.5 MG/5ML PO ELIX: 12.5000 mg | ORAL_SOLUTION | ORAL | Status: DC | PRN

## 2017-08-29 MED ORDER — TRANEXAMIC ACID 1000 MG/10ML IV SOLN
1000.0000 mg | Freq: Once | INTRAVENOUS | Status: AC
  Administered 2017-08-29: 1000 mg via INTRAVENOUS
  Filled 2017-08-29: qty 10

## 2017-08-29 MED ORDER — CEFAZOLIN SODIUM-DEXTROSE 2-3 GM-%(50ML) IV SOLR
INTRAVENOUS | Status: DC | PRN
  Administered 2017-08-29: 2 g via INTRAVENOUS

## 2017-08-29 MED ORDER — BUPIVACAINE-EPINEPHRINE (PF) 0.25% -1:200000 IJ SOLN: INTRAMUSCULAR | Status: DC | PRN

## 2017-08-29 MED ORDER — ONDANSETRON HCL 4 MG/2ML IJ SOLN
INTRAMUSCULAR | Status: AC
  Filled 2017-08-29: qty 2

## 2017-08-29 MED ORDER — PHENOL 1.4 % MT LIQD: 1.0000 | OROMUCOSAL | Status: DC | PRN

## 2017-08-29 MED ORDER — CHLORHEXIDINE GLUCONATE 4 % EX LIQD: 60.0000 mL | Freq: Once | CUTANEOUS | Status: DC

## 2017-08-29 MED ORDER — DOCUSATE SODIUM 100 MG PO CAPS
100.0000 mg | ORAL_CAPSULE | Freq: Two times a day (BID) | ORAL | Status: DC
  Administered 2017-08-30 – 2017-09-01 (×5): 100 mg via ORAL
  Filled 2017-08-29 (×5): qty 1

## 2017-08-29 MED ORDER — EPHEDRINE SULFATE-NACL 50-0.9 MG/10ML-% IV SOSY
PREFILLED_SYRINGE | INTRAVENOUS | Status: DC | PRN
  Administered 2017-08-29: 10 mg via INTRAVENOUS
  Administered 2017-08-29: 5 mg via INTRAVENOUS

## 2017-08-29 MED ORDER — ACETAMINOPHEN 650 MG RE SUPP: 650.0000 mg | RECTAL | Status: DC | PRN

## 2017-08-29 MED ORDER — BUPIVACAINE HCL (PF) 0.25 % IJ SOLN
INTRAMUSCULAR | Status: DC | PRN
  Administered 2017-08-29: 30 mL

## 2017-08-29 MED ORDER — MENTHOL 3 MG MT LOZG
1.0000 | LOZENGE | OROMUCOSAL | Status: DC | PRN
  Filled 2017-08-29: qty 9

## 2017-08-29 MED ORDER — ONDANSETRON HCL 4 MG PO TABS: 4.0000 mg | ORAL_TABLET | Freq: Four times a day (QID) | ORAL | Status: DC | PRN

## 2017-08-29 MED ORDER — PHENYLEPHRINE HCL 10 MG/ML IJ SOLN
INTRAMUSCULAR | Status: AC
  Filled 2017-08-29: qty 2

## 2017-08-29 MED ORDER — PROPOFOL 500 MG/50ML IV EMUL
INTRAVENOUS | Status: DC | PRN
  Administered 2017-08-29: 50 ug/kg/min via INTRAVENOUS

## 2017-08-29 MED ORDER — EPHEDRINE 5 MG/ML INJ
INTRAVENOUS | Status: AC
  Filled 2017-08-29: qty 10

## 2017-08-29 MED ORDER — ONDANSETRON HCL 4 MG/2ML IJ SOLN
4.0000 mg | Freq: Four times a day (QID) | INTRAMUSCULAR | Status: DC | PRN
  Administered 2017-08-30: 4 mg via INTRAVENOUS
  Filled 2017-08-29 (×2): qty 2

## 2017-08-29 SURGICAL SUPPLY — 44 items
BLADE SAW SGTL 18X1.27X75 (BLADE) ×2 IMPLANT
BLADE SAW SGTL 18X1.27X75MM (BLADE) ×1
CABLE CERLAGE W/CRIMP 1.8 (Cable) ×2 IMPLANT
CABLE CERLAGE W/CRIMP 1.8MM (Cable) ×1 IMPLANT
CAPT HIP TOTAL 2 ×3 IMPLANT
CELLS DAT CNTRL 66122 CELL SVR (MISCELLANEOUS) ×1 IMPLANT
COVER PERINEAL POST (MISCELLANEOUS) ×3 IMPLANT
COVER SURGICAL LIGHT HANDLE (MISCELLANEOUS) ×3 IMPLANT
DRAPE C-ARM 42X72 X-RAY (DRAPES) ×3 IMPLANT
DRAPE STERI IOBAN 125X83 (DRAPES) ×3 IMPLANT
DRAPE U-SHAPE 47X51 STRL (DRAPES) ×6 IMPLANT
DRSG AQUACEL AG ADV 3.5X10 (GAUZE/BANDAGES/DRESSINGS) ×3 IMPLANT
DRSG OPSITE POSTOP 4X6 (GAUZE/BANDAGES/DRESSINGS) IMPLANT
DURAPREP 26ML APPLICATOR (WOUND CARE) ×3 IMPLANT
ELECT BLADE 4.0 EZ CLEAN MEGAD (MISCELLANEOUS) ×3
ELECT REM PT RETURN 9FT ADLT (ELECTROSURGICAL) ×3
ELECTRODE BLDE 4.0 EZ CLN MEGD (MISCELLANEOUS) ×1 IMPLANT
ELECTRODE REM PT RTRN 9FT ADLT (ELECTROSURGICAL) ×1 IMPLANT
FACESHIELD WRAPAROUND (MASK) ×9 IMPLANT
GLOVE BIO SURGEON STRL SZ8 (GLOVE) IMPLANT
GLOVE BIOGEL PI IND STRL 8 (GLOVE) ×2 IMPLANT
GLOVE BIOGEL PI INDICATOR 8 (GLOVE) ×4
GLOVE SURG SS PI 7.0 STRL IVOR (GLOVE) ×12 IMPLANT
GOWN STRL REUS W/ TWL LRG LVL3 (GOWN DISPOSABLE) ×1 IMPLANT
GOWN STRL REUS W/ TWL XL LVL3 (GOWN DISPOSABLE) ×3 IMPLANT
GOWN STRL REUS W/TWL LRG LVL3 (GOWN DISPOSABLE) ×2
GOWN STRL REUS W/TWL XL LVL3 (GOWN DISPOSABLE) ×6
KIT BASIN OR (CUSTOM PROCEDURE TRAY) ×3 IMPLANT
KIT ROOM TURNOVER OR (KITS) ×3 IMPLANT
MANIFOLD NEPTUNE II (INSTRUMENTS) ×3 IMPLANT
NEEDLE HYPO 22GX1.5 SAFETY (NEEDLE) IMPLANT
NS IRRIG 1000ML POUR BTL (IV SOLUTION) ×3 IMPLANT
PACK TOTAL JOINT (CUSTOM PROCEDURE TRAY) ×3 IMPLANT
PAD ARMBOARD 7.5X6 YLW CONV (MISCELLANEOUS) ×3 IMPLANT
RTRCTR WOUND ALEXIS 18CM MED (MISCELLANEOUS) ×3
SUT ETHIBOND NAB CT1 #1 30IN (SUTURE) ×6 IMPLANT
SUT VIC AB 1 CT1 27 (SUTURE) ×2
SUT VIC AB 1 CT1 27XBRD ANBCTR (SUTURE) ×1 IMPLANT
SUT VIC AB 2-0 CT1 27 (SUTURE) ×2
SUT VIC AB 2-0 CT1 TAPERPNT 27 (SUTURE) ×1 IMPLANT
SUT VIC AB 3-0 PS2 18 (SUTURE) ×2
SUT VIC AB 3-0 PS2 18XBRD (SUTURE) ×1 IMPLANT
SUT VLOC 180 0 24IN GS25 (SUTURE) ×3 IMPLANT
SYR 50ML LL SCALE MARK (SYRINGE) ×3 IMPLANT

## 2017-08-29 NOTE — Progress Notes (Signed)
SBP 90's-102, NSR 90's, pt awake,alert,no c/o.  Dr Jefm Petty here & updated-new order for IV Albumin. Will continue to monitor.

## 2017-08-29 NOTE — Anesthesia Postprocedure Evaluation (Signed)
Anesthesia Post Note  Patient: Barbara Soto  Procedure(s) Performed: TOTAL HIP ARTHROPLASTY ANTERIOR APPROACH (Right )     Patient location during evaluation: PACU Anesthesia Type: Spinal Level of consciousness: oriented and awake and alert Pain management: pain level controlled Vital Signs Assessment: post-procedure vital signs reviewed and stable Respiratory status: spontaneous breathing, respiratory function stable and patient connected to nasal cannula oxygen Cardiovascular status: blood pressure returned to baseline and stable Postop Assessment: no headache, no backache and no apparent nausea or vomiting Anesthetic complications: no    Last Vitals:  Vitals:   08/29/17 1731 08/29/17 1745  BP: (!) 99/58   Pulse:  86  Resp:  14  Temp:    SpO2:  99%    Last Pain:  Vitals:   08/29/17 1718  TempSrc:   PainSc: 0-No pain                 Nevelyn Mellott,W. EDMOND

## 2017-08-29 NOTE — Anesthesia Procedure Notes (Signed)
Spinal  Patient location during procedure: OR Start time: 08/29/2017 12:46 PM End time: 08/29/2017 12:50 PM Staffing Anesthesiologist: Janeece Riggers, MD Preanesthetic Checklist Completed: patient identified, site marked, surgical consent, pre-op evaluation, timeout performed, IV checked, risks and benefits discussed and monitors and equipment checked Spinal Block Patient position: sitting Prep: DuraPrep Patient monitoring: heart rate, cardiac monitor, continuous pulse ox and blood pressure Approach: midline Location: L3-4 Injection technique: single-shot Needle Needle type: Sprotte  Needle gauge: 24 G Needle length: 9 cm Assessment Sensory level: T4

## 2017-08-29 NOTE — Transfer of Care (Signed)
Immediate Anesthesia Transfer of Care Note  Patient: Barbara Soto  Procedure(s) Performed: TOTAL HIP ARTHROPLASTY ANTERIOR APPROACH (Right )  Patient Location: PACU  Anesthesia Type:MAC and Spinal  Level of Consciousness: awake, alert  and oriented  Airway & Oxygen Therapy: Patient Spontanous Breathing and Patient connected to face mask oxygen  Post-op Assessment: Report given to RN and Post -op Vital signs reviewed and stable  Post vital signs: Reviewed and stable  Last Vitals:  Vitals:   08/29/17 1136 08/29/17 1157  BP: (!) 196/72 (!) 187/86  Pulse:    Resp:    Temp:    SpO2:      Last Pain:  Vitals:   08/29/17 1128  TempSrc:   PainSc: 5       Patients Stated Pain Goal: 3 (33/00/76 2263)  Complications: No apparent anesthesia complications

## 2017-08-29 NOTE — Op Note (Addendum)
PRE-OP DIAGNOSIS:  RIGHT HIP DEGENERATIVE JOINT DISEASE POST-OP DIAGNOSIS:  same PROCEDURE: RIGHT TOTAL HIP ARTHROPLASTY ANTERIOR APPROACH ANESTHESIA:  Spinal and MAC SURGEON:  Melrose Nakayama MD ASSISTANT:  Loni Dolly PA-C   INDICATIONS FOR PROCEDURE:  The patient is a 77 y.o. female with a long history of a painful hip.  This has persisted despite multiple conservative measures.  The patient has persisted with pain and dysfunction making rest and activity difficult.  A total hip replacement is offered as surgical treatment.  Informed operative consent was obtained after discussion of possible complications including reaction to anesthesia, infection, neurovascular injury, dislocation, DVT, PE, and death.  The importance of the postoperative rehab program to optimize result was stressed with the patient.  SUMMARY OF FINDINGS AND PROCEDURE:  Under general anesthesia through a anterior approach an the Hana table a right THR was performed.  The patient had severe degenerative change and poor bone quality.  We used DePuy components to replace the hip and these were size KLA 16 Corail femur capped with a +1 26m ceramic hip ball.  On the acetabular side we used a size 48 Gription shell with a  plus 0 neutral polyethylene liner.  We did use a hole eliminator.  ALoni DollyPA-C assisted throughout and was invaluable to the completion of the case in that he helped position and retract while I performed the procedure.  He also closed simultaneously to help minimize OR time.  I used fluoroscopy throughout the case to check position of implants and leg lengths and read all of these views myself.  DESCRIPTION OF PROCEDURE:  The patient was taken to the OR suite where general anesthetic was applied.  The patient was then positioned on the Hana table supine.  All bony prominences were appropriately padded.  Prep and drape was then performed in normal sterile fashion.  The patient was given kefzol preoperative  antibiotic and an appropriate time out was performed.  We then took an anterior approach to the right hip.  Dissection was taken through adipose to the tensor fascia lata fascia.  This structure was incised longitudinally and we dissected in the intermuscular interval just medial to this muscle.  Cobra retractors were placed superior and inferior to the femoral neck superficial to the capsule.  A capsular incision was then made and the retractors were placed along the femoral neck.  Xray was brought in to get a good level for the femoral neck cut which was made with an oscillating saw and osteotome.  The femoral head was removed with a corkscrew.  The acetabulum was exposed and some labral tissues were excised. Reaming was taken to the inside wall of the pelvis and sequentially up to 1 mm smaller than the actual component.  A trial of components was done and then the aforementioned acetabular shell was placed in appropriate tilt and anteversion confirmed by fluoroscopy. The liner was placed along with the hole eliminator and attention was turned to the femur.  The leg was brought down and over into adduction and the elevator bar was used to raise the femur up gently in the wound.  The piriformis was released with care taken to preserve the obturator internus attachment and all of the posterior capsule. The femur was reamed and then broached to the appropriate size.  A trial reduction was done and the aforementioned head and neck assembly gave uKoreathe best stability in extension with external rotation.  Leg lengths were felt to be about equal by  fluoroscopic exam.  We noticed a crack on the medial aspect of the femur which did not extent below the lesser troch and did not involve the calcar.  We elected to place one cable to hopefully prevent propagation of this crack. The trial components were removed and the wound irrigated.  We then placed the femoral component in appropriate anteversion.  The head was applied to a  dry stem neck and the hip again reduced.  It was again stable in the aforementioned position.  The would was irrigated again followed by re-approximation of anterior capsule with ethibond suture. Tensor fascia was repaired with V-loc suture  followed by deep closure with #O and #2 undyed vicryl.  Skin was closed with subQ stitch and steristrips followed by a sterile dressing.  EBL and IOF can be obtained from anesthesia records.  DISPOSITION:  The patient was extubated in the OR and taken to PACU in stable condition to be admitted to the Orthopedic Surgery for appropriate post-op care to include perioperative antibiotics and DVT prophylaxis.

## 2017-08-29 NOTE — Interval H&P Note (Signed)
History and Physical Interval Note:  08/29/2017 11:45 AM  Barbara Soto  has presented today for surgery, with the diagnosis of RIGHT HIP DEGENERATIVE JOINT DISEASE  The various methods of treatment have been discussed with the patient and family. After consideration of risks, benefits and other options for treatment, the patient has consented to  Procedure(s): TOTAL HIP ARTHROPLASTY ANTERIOR APPROACH (Right) as a surgical intervention .  The patient's history has been reviewed, patient examined, no change in status, stable for surgery.  I have reviewed the patient's chart and labs.  Questions were answered to the patient's satisfaction.     Hobson Lax G

## 2017-08-29 NOTE — Anesthesia Preprocedure Evaluation (Addendum)
Anesthesia Evaluation  Patient identified by MRN, date of birth, ID band Patient awake    Reviewed: Allergy & Precautions, NPO status , Patient's Chart, lab work & pertinent test results  Airway Mallampati: II  TM Distance: >3 FB Neck ROM: Full    Dental no notable dental hx. (+) Edentulous Upper, Upper Dentures   Pulmonary neg pulmonary ROS,    Pulmonary exam normal breath sounds clear to auscultation       Cardiovascular hypertension, negative cardio ROS Normal cardiovascular exam Rhythm:Regular Rate:Normal  CXR 08/28/17: No active cardiopulmonary disease.  EKG 08/28/17: Sinus rhythm with PACs. LAFB.  - Pt reports last EKG was 25 years ago.   Pt denied acute CV symptoms at pre-admission testing.       Neuro/Psych negative neurological ROS  negative psych ROS   GI/Hepatic negative GI ROS, Neg liver ROS,   Endo/Other  negative endocrine ROS  Renal/GU negative Renal ROS  negative genitourinary   Musculoskeletal negative musculoskeletal ROS (+) Arthritis , Osteoarthritis,    Abdominal   Peds negative pediatric ROS (+)  Hematology negative hematology ROS (+)   Anesthesia Other Findings   Reproductive/Obstetrics negative OB ROS                            Anesthesia Physical Anesthesia Plan  ASA: II  Anesthesia Plan: Spinal   Post-op Pain Management:    Induction:   PONV Risk Score and Plan: 2 and Ondansetron and Treatment may vary due to age or medical condition  Airway Management Planned: Nasal Cannula and Natural Airway  Additional Equipment:   Intra-op Plan:   Post-operative Plan:   Informed Consent: I have reviewed the patients History and Physical, chart, labs and discussed the procedure including the risks, benefits and alternatives for the proposed anesthesia with the patient or authorized representative who has indicated his/her understanding and acceptance.      Plan Discussed with: CRNA, Anesthesiologist and Surgeon  Anesthesia Plan Comments: (  )        Anesthesia Quick Evaluation

## 2017-08-29 NOTE — Progress Notes (Signed)
Care of pt assumed by MA Aleck Locklin RN 

## 2017-08-30 ENCOUNTER — Encounter (HOSPITAL_COMMUNITY): Payer: Self-pay | Admitting: Orthopaedic Surgery

## 2017-08-30 ENCOUNTER — Other Ambulatory Visit: Payer: Self-pay

## 2017-08-30 LAB — CBC
HCT: 26.4 % — ABNORMAL LOW (ref 36.0–46.0)
HEMOGLOBIN: 9.1 g/dL — AB (ref 12.0–15.0)
MCH: 31.5 pg (ref 26.0–34.0)
MCHC: 34.5 g/dL (ref 30.0–36.0)
MCV: 91.3 fL (ref 78.0–100.0)
PLATELETS: 236 10*3/uL (ref 150–400)
RBC: 2.89 MIL/uL — AB (ref 3.87–5.11)
RDW: 13.7 % (ref 11.5–15.5)
WBC: 13.5 10*3/uL — ABNORMAL HIGH (ref 4.0–10.5)

## 2017-08-30 LAB — BASIC METABOLIC PANEL
Anion gap: 9 (ref 5–15)
BUN: 17 mg/dL (ref 6–20)
CHLORIDE: 106 mmol/L (ref 101–111)
CO2: 24 mmol/L (ref 22–32)
CREATININE: 0.96 mg/dL (ref 0.44–1.00)
Calcium: 8.4 mg/dL — ABNORMAL LOW (ref 8.9–10.3)
GFR, EST NON AFRICAN AMERICAN: 56 mL/min — AB (ref >60)
Glucose, Bld: 171 mg/dL — ABNORMAL HIGH (ref 65–99)
Potassium: 4 mmol/L (ref 3.5–5.1)
SODIUM: 139 mmol/L (ref 135–145)

## 2017-08-30 MED ORDER — PROMETHAZINE HCL 25 MG PO TABS
12.5000 mg | ORAL_TABLET | Freq: Four times a day (QID) | ORAL | Status: DC | PRN
  Administered 2017-08-30 (×2): 25 mg via ORAL
  Filled 2017-08-30 (×2): qty 1

## 2017-08-30 MED ORDER — HYDROCODONE-ACETAMINOPHEN 5-325 MG PO TABS
1.0000 | ORAL_TABLET | ORAL | Status: DC | PRN
  Administered 2017-08-31 (×3): 2 via ORAL
  Administered 2017-08-31: 1 via ORAL
  Administered 2017-09-01 (×2): 2 via ORAL
  Filled 2017-08-30 (×2): qty 1
  Filled 2017-08-30: qty 2
  Filled 2017-08-30: qty 1
  Filled 2017-08-30: qty 2
  Filled 2017-08-30 (×4): qty 1

## 2017-08-30 MED ORDER — PROMETHAZINE HCL 25 MG/ML IJ SOLN: 6.2500 mg | Freq: Four times a day (QID) | INTRAMUSCULAR | Status: DC | PRN

## 2017-08-30 NOTE — Plan of Care (Signed)
  Nutrition: Adequate nutrition will be maintained 08/30/2017 1044 - Progressing by Williams Che, RN   Elimination: Will not experience complications related to bowel motility 08/30/2017 1044 - Progressing by Williams Che, RN   Pain Managment: General experience of comfort will improve 08/30/2017 1044 - Progressing by Williams Che, RN   Safety: Ability to remain free from injury will improve 08/30/2017 1044 - Progressing by Williams Che, RN

## 2017-08-30 NOTE — Progress Notes (Signed)
Physical Therapy Treatment Patient Details Name: Barbara Soto MRN: [id number] DOB: 1940-10-12 Today's Date: 08/30/2017    History of Present Illness 77 y.o. female s/p R THA (anterior). PMH includes:  HTN, abdominal hysterectomy.     PT Comments    Pt continues to be limited by nausea and emesis at this time. Pt eager to work with therapy, demostratning progression in bed mobility. Patient quickly became dizzy and sick to stomach upon OOB mobility and returned to EOB. BP: 130/60, SpO2=99%. RN notified. Discussed with patient and daughter plan for lengthy PT session tomorrow covering stairs and gait mechanics. Hopeful for progression as today was limited.     Follow Up Recommendations  Home health PT;Supervision/Assistance - 24 hour     Equipment Recommendations  None recommended by PT    Recommendations for Other Services OT consult     Precautions / Restrictions Precautions Precautions: Fall Restrictions Weight Bearing Restrictions: Yes RLE Weight Bearing: Weight bearing as tolerated    Mobility  Bed Mobility Overal bed mobility: Needs Assistance Bed Mobility: Supine to Sit;Sit to Supine     Supine to sit: Supervision     General bed mobility comments: progressed to supervison   Transfers Overall transfer level: Needs assistance Equipment used: Rolling walker (2 wheeled) Transfers: Sit to/from Stand Sit to Stand: Supervision            Ambulation/Gait             General Gait Details: unable due to nausea/vommiting   Stairs            Wheelchair Mobility    Modified Rankin (Stroke Patients Only)       Balance Overall balance assessment: Needs assistance Sitting-balance support: No upper extremity supported;Feet unsupported Sitting balance-Leahy Scale: Fair                                      Cognition Arousal/Alertness: Awake/alert Behavior During Therapy: WFL for tasks assessed/performed Overall Cognitive  Status: Within Functional Limits for tasks assessed                                        Exercises      General Comments        Pertinent Vitals/Pain Pain Assessment: 0-10 Pain Score: 5  Pain Location: R knee  Pain Descriptors / Indicators: Aching;Grimacing;Operative site guarding Pain Intervention(s): Limited activity within patient's tolerance;Monitored during session;Premedicated before session    Home Living Family/patient expects to be discharged to:: Private residence Living Arrangements: Children                  Prior Function            PT Goals (current goals can now be found in the care plan section) Acute Rehab PT Goals Patient Stated Goal: return to daugthers home with 24 hour care PT Goal Formulation: With patient Time For Goal Achievement: 09/06/17 Potential to Achieve Goals: Good    Frequency    7X/week      PT Plan Current plan remains appropriate    Co-evaluation              AM-PAC PT "6 Clicks" Daily Activity  Outcome Measure  Difficulty turning over in bed (including adjusting bedclothes, sheets and blankets)?: Unable Difficulty moving from lying on  back to sitting on the side of the bed? : Unable Difficulty sitting down on and standing up from a chair with arms (e.g., wheelchair, bedside commode, etc,.)?: Unable Help needed moving to and from a bed to chair (including a wheelchair)?: A Little Help needed walking in hospital room?: A Little Help needed climbing 3-5 steps with a railing? : A Lot 6 Click Score: 11    End of Session Equipment Utilized During Treatment: Gait belt Activity Tolerance: Patient limited by fatigue(nausea) Patient left: in chair;with call bell/phone within reach;with family/visitor present Nurse Communication: Mobility status PT Visit Diagnosis: Unsteadiness on feet (R26.81);Other abnormalities of gait and mobility (R26.89);Muscle weakness (generalized) (M62.81);Pain Pain -  Right/Left: Right Pain - part of body: Hip     Time: 1730-1750 PT Time Calculation (min) (ACUTE ONLY): 20 min  Charges:  $Therapeutic Activity: 8-22 mins                    G Codes:       Reinaldo Berber, PT, DPT Acute Rehab Services Pager: [phone]     Reinaldo Berber 08/30/2017, 5:52 PM

## 2017-08-30 NOTE — Progress Notes (Signed)
Orthopedic Tech Progress Note Patient Details:  Barbara Soto November 07, 1940 [id number] Patient exceeds age requirements for overhead frame. Patient ID: Barbara Soto, female   DOB: 1941/07/17, 77 y.o.   MRN: [id number]   Barbara Soto 08/30/2017, 4:37 PM

## 2017-08-30 NOTE — Progress Notes (Signed)
Subjective: 1 Day Post-Op Procedure(s) (LRB): TOTAL HIP ARTHROPLASTY ANTERIOR APPROACH (Right)  Patient is nauseated and vomiting this morning. She states her pain is ok and she wants to go home if possible.   Activity level:  wbat Diet tolerance:  ok Voiding:  ok Patient reports pain as mild.    Objective: Vital signs in last 24 hours: Temp:  [97.6 F (36.4 C)-98 F (36.7 C)] 97.8 F (36.6 C) (01/30 0348) Pulse Rate:  [79-99] 79 (01/30 0348) Resp:  [14-20] 16 (01/30 0348) BP: (90-196)/(53-86) 93/60 (01/30 0348) SpO2:  [95 %-100 %] 96 % (01/30 0348) Weight:  [93 kg (205 lb)] 93 kg (205 lb) (01/29 1128)  Labs: Recent Labs    08/28/17 1029  HGB 13.6   Recent Labs    08/28/17 1029  WBC 8.4  RBC 4.53  HCT 41.1  PLT 276   Recent Labs    08/28/17 1029  NA 141  K 4.5  CL 106  CO2 22  BUN 12  CREATININE 1.04*  GLUCOSE 108*  CALCIUM 9.3   Recent Labs    08/28/17 1029  INR 1.07    Physical Exam:  Neurologically intact ABD soft Neurovascular intact Sensation intact distally Intact pulses distally Dorsiflexion/Plantar flexion intact Incision: dressing C/D/I and no drainage No cellulitis present Compartment soft  Assessment/Plan:  1 Day Post-Op Procedure(s) (LRB): TOTAL HIP ARTHROPLASTY ANTERIOR APPROACH (Right) Advance diet Up with therapy Plan for discharge tomorrow Discharge home with home health if doing well and cleared by PT. I added phenergan to help with nausea. Follow up in office in 2 weeks post op. Continue on ASA 235mg  BID x 4 weeks post op. I will check on her labwork later today once it is back.  Barbara Soto, Larwance Sachs 08/30/2017, 7:53 AM

## 2017-08-30 NOTE — Evaluation (Signed)
Physical Therapy Evaluation Patient Details Name: Barbara Soto MRN: [id number] DOB: 1941/05/11 Today's Date: 08/30/2017   History of Present Illness  77 y.o. female s/p R THA (anterior). PMH includes:  HTN, abdominal hysterectomy.   Clinical Impression  Patient is s/p above surgery resulting in functional limitations due to the deficits listed below (see PT Problem List). PTA, patient was living alone ambulating without assistive device and independent with ADLs. Upon evaluation, pt presents with post op pain and weakness, and nausea/dizziness that are limiting her mobility at this time. Patient currently min A level with OOB mobility and ambulating short distances in hallway with LOB x2 requiring mod A to stabilize. Progression limited by nausea, RN notified. Will visit patient again later today and progress as tolerated. Feel she is a fall risk at this point in time, although states preference to return to daughters home with her 24/7 care. Daughter present in session and helpful I feel this is attainable goal with progression over next PT sessions.  Patient will benefit from skilled PT to increase their independence and safety with mobility to allow discharge to the venue listed below.       Follow Up Recommendations Home health PT;Supervision/Assistance - 24 hour    Equipment Recommendations  None recommended by PT    Recommendations for Other Services OT consult     Precautions / Restrictions Precautions Precautions: Fall Restrictions Weight Bearing Restrictions: Yes RLE Weight Bearing: Weight bearing as tolerated      Mobility  Bed Mobility Overal bed mobility: Needs Assistance Bed Mobility: Supine to Sit     Supine to sit: Min guard     General bed mobility comments: min gaurd for safety.   Transfers Overall transfer level: Needs assistance   Transfers: Sit to/from Stand Sit to Stand: Min guard         General transfer comment: min guard, cues for hand  placement with RW  Ambulation/Gait Ambulation/Gait assistance: Min assist Ambulation Distance (Feet): 40 Feet Assistive device: Rolling walker (2 wheeled) Gait Pattern/deviations: Step-to pattern;Staggering right;Narrow base of support;Antalgic Gait velocity: decreased   General Gait Details: Patient ambulating with poor mechanics with LOB x  Stairs            Wheelchair Mobility    Modified Rankin (Stroke Patients Only)       Balance Overall balance assessment: Needs assistance Sitting-balance support: No upper extremity supported;Feet unsupported Sitting balance-Leahy Scale: Fair     Standing balance support: During functional activity;Bilateral upper extremity supported Standing balance-Leahy Scale: Poor Standing balance comment: LOB during ambulation with BUE support.                             Pertinent Vitals/Pain Pain Assessment: 0-10 Pain Score: 5  Pain Location: R knee  Pain Descriptors / Indicators: Aching;Grimacing;Operative site guarding    Home Living Family/patient expects to be discharged to:: Private residence Living Arrangements: Children Available Help at Discharge: Family;Available 24 hours/day(daughter) Type of Home: House Home Access: Stairs to enter   [employer] of Steps: 2 Home Layout: One level Home Equipment: Walker - 4 wheels;Bedside commode      Prior Function Level of Independence: Independent         Comments: mobility and ADLs     Hand Dominance        Extremity/Trunk Assessment        Lower Extremity Assessment Lower Extremity Assessment: (Strength: LLE 4/5 gross, RLE 3+/5 gross  post op pain )    Cervical / Trunk Assessment Cervical / Trunk Assessment: Normal  Communication   Communication: No difficulties  Cognition Arousal/Alertness: Awake/alert Behavior During Therapy: WFL for tasks assessed/performed Overall Cognitive Status: Within Functional Limits for tasks assessed                                         General Comments General comments (skin integrity, edema, etc.): Discussed importance of eating with pt as she has been sick to stomach all night and day. Pt reports dizziness with short distance ambulation, BP 129/60, SpO2= 99 HR:92. returned to bedside chair and reports feeling better after dry heaving.     Exercises Total Joint Exercises Ankle Circles/Pumps: 20 reps Quad Sets: 10 reps Hip ABduction/ADduction: 10 reps Knee Flexion: 10 reps Marching in Standing: 10 reps   Assessment/Plan    PT Assessment Patient needs continued PT services  PT Problem List Decreased strength;Decreased range of motion;Decreased activity tolerance;Decreased balance;Decreased mobility;Decreased coordination;Decreased knowledge of use of DME;Decreased safety awareness;Pain       PT Treatment Interventions DME instruction;Gait training;Functional mobility training;Stair training;Therapeutic activities;Therapeutic exercise;Balance training    PT Goals (Current goals can be found in the Care Plan section)  Acute Rehab PT Goals Patient Stated Goal: return to daugthers home with 24 hour care PT Goal Formulation: With patient Time For Goal Achievement: 09/06/17 Potential to Achieve Goals: Good    Frequency 7X/week   Barriers to discharge        Co-evaluation               AM-PAC PT "6 Clicks" Daily Activity  Outcome Measure Difficulty turning over in bed (including adjusting bedclothes, sheets and blankets)?: Unable Difficulty moving from lying on back to sitting on the side of the bed? : Unable Difficulty sitting down on and standing up from a chair with arms (e.g., wheelchair, bedside commode, etc,.)?: Unable Help needed moving to and from a bed to chair (including a wheelchair)?: A Little Help needed walking in hospital room?: A Little Help needed climbing 3-5 steps with a railing? : A Lot 6 Click Score: 11    End of Session Equipment  Utilized During Treatment: Gait belt Activity Tolerance: Patient limited by fatigue(Nausea ) Patient left: in chair;with call bell/phone within reach;with family/visitor present Nurse Communication: Mobility status(Nausea) PT Visit Diagnosis: Unsteadiness on feet (R26.81);Other abnormalities of gait and mobility (R26.89);Muscle weakness (generalized) (M62.81);Pain Pain - Right/Left: Right Pain - part of body: Hip    Time: 0102-7253 PT Time Calculation (min) (ACUTE ONLY): 40 min   Charges:   PT Evaluation $PT Eval Low Complexity: 1 Low PT Treatments $Gait Training: 8-22 mins $Therapeutic Exercise: 8-22 mins   PT G Codes:       Reinaldo Berber, PT, DPT Acute Rehab Services Pager: [phone]    Reinaldo Berber 08/30/2017, 12:19 PM

## 2017-08-31 LAB — CBC
HEMATOCRIT: 21.8 % — AB (ref 36.0–46.0)
HEMOGLOBIN: 7.2 g/dL — AB (ref 12.0–15.0)
MCH: 30 pg (ref 26.0–34.0)
MCHC: 33 g/dL (ref 30.0–36.0)
MCV: 90.8 fL (ref 78.0–100.0)
PLATELETS: 172 10*3/uL (ref 150–400)
RBC: 2.4 MIL/uL — AB (ref 3.87–5.11)
RDW: 13.7 % (ref 11.5–15.5)
WBC: 10.5 10*3/uL (ref 4.0–10.5)

## 2017-08-31 LAB — POCT I-STAT 4, (NA,K, GLUC, HGB,HCT)
GLUCOSE: 111 mg/dL — AB (ref 65–99)
HEMATOCRIT: 30 % — AB (ref 36.0–46.0)
HEMOGLOBIN: 10.2 g/dL — AB (ref 12.0–15.0)
Potassium: 4.4 mmol/L (ref 3.5–5.1)
Sodium: 142 mmol/L (ref 135–145)

## 2017-08-31 NOTE — Progress Notes (Signed)
Subjective: 2 Days Post-Op Procedure(s) (LRB): TOTAL HIP ARTHROPLASTY ANTERIOR APPROACH (Right)   patient nausea is better on hydrocodone. She is in a little more pain this morning but is not throwing up.  Activity level:  wnat Diet tolerance:  ok Voiding:  ok Patient reports pain as mild and moderate.    Objective: Vital signs in last 24 hours: Temp:  [98.6 F (37 C)-99 F (37.2 C)] 98.6 F (37 C) (01/31 0540) Pulse Rate:  [91-93] 91 (01/31 0540) Resp:  [16] 16 (01/31 0540) BP: (119-148)/(60-70) 119/70 (01/31 0540) SpO2:  [96 %-99 %] 99 % (01/31 0540)  Labs: Recent Labs    08/28/17 1029 08/30/17 0648  HGB 13.6 9.1*   Recent Labs    08/28/17 1029 08/30/17 0648  WBC 8.4 13.5*  RBC 4.53 2.89*  HCT 41.1 26.4*  PLT 276 236   Recent Labs    08/28/17 1029 08/30/17 0648  NA 141 139  K 4.5 4.0  CL 106 106  CO2 22 24  BUN 12 17  CREATININE 1.04* 0.96  GLUCOSE 108* 171*  CALCIUM 9.3 8.4*   Recent Labs    08/28/17 1029  INR 1.07    Physical Exam:  Neurologically intact ABD soft Neurovascular intact Sensation intact distally Intact pulses distally Dorsiflexion/Plantar flexion intact Incision: dressing C/D/I and no drainage No cellulitis present Compartment soft  Assessment/Plan:  2 Days Post-Op Procedure(s) (LRB): TOTAL HIP ARTHROPLASTY ANTERIOR APPROACH (Right) Advance diet Up with therapy Discharge home with home health probably tomorrow unless she clears PT today. Follow up in office 2 weeks post op. Continue on ASA 325mg  BID x 4 weeks post op.  Osa Campoli, Larwance Sachs 08/31/2017, 7:04 AM

## 2017-08-31 NOTE — Progress Notes (Signed)
Physical Therapy Treatment Patient Details Name: Barbara Soto MRN: [id number] DOB: [birth year] Today's Date: 08/31/2017    History of Present Illness 77 y.o. female s/p R THA (anterior). PMH includes:  HTN, abdominal hysterectomy.     PT Comments    Pt progressing well with therapy this session. Able to ambulate further distances without fatigue and introduce stair training. Pt had 1 LOB on stairs requiring mod A to correct, otherwise min guard. Feel patient and family will benefit from another session on stairs to maximize safety before return to home.      Follow Up Recommendations  Home health PT;Supervision/Assistance - 24 hour     Equipment Recommendations  None recommended by PT    Recommendations for Other Services OT consult     Precautions / Restrictions Precautions Precautions: Fall Restrictions Weight Bearing Restrictions: Yes RLE Weight Bearing: Weight bearing as tolerated    Mobility  Bed Mobility Overal bed mobility: Needs Assistance Bed Mobility: Supine to Sit     Supine to sit: Supervision     General bed mobility comments: Patient using sheet for RLE advacment in and out of bed due to pain.  Transfers Overall transfer level: Needs assistance Equipment used: Rolling walker (2 wheeled) Transfers: Sit to/from Stand Sit to Stand: Min guard;Min assist         General transfer comment: min guard for safety  Ambulation/Gait Ambulation/Gait assistance: Min guard Ambulation Distance (Feet): 300 Feet Assistive device: Rolling walker (2 wheeled) Gait Pattern/deviations: Step-to pattern;Antalgic Gait velocity: decreased   General Gait Details: ambulating further distances with trunk flexed posture, antlagic and short step length BL. patient unsteady, requires min guard to close supervision at best for safety.    Stairs Stairs: Yes   Stair Management: Backwards;With walker Number of Stairs: 12 General stair comments: cues for sequencing and  technique with RW and family education for stabilziation and guarding. Pt able to perform stairs with min guard, 1 LOB requring Mod A to correct on stairs  Wheelchair Mobility    Modified Rankin (Stroke Patients Only)       Balance Overall balance assessment: Needs assistance Sitting-balance support: No upper extremity supported;Feet unsupported Sitting balance-Leahy Scale: Fair     Standing balance support: During functional activity;Bilateral upper extremity supported Standing balance-Leahy Scale: Fair                              Cognition Arousal/Alertness: Awake/alert Behavior During Therapy: WFL for tasks assessed/performed Overall Cognitive Status: Within Functional Limits for tasks assessed                                        Exercises      General Comments General comments (skin integrity, edema, etc.): SpO2=98% with activity on RA      Pertinent Vitals/Pain Pain Assessment: Faces Faces Pain Scale: Hurts little more Pain Location: R knee and hip Pain Descriptors / Indicators: Aching;Grimacing;Operative site guarding Pain Intervention(s): Limited activity within patient's tolerance;Monitored during session    Home Living                      Prior Function            PT Goals (current goals can now be found in the care plan section) Acute Rehab PT Goals Patient Stated Goal: return to daugthers home  with 24 hour care PT Goal Formulation: With patient Time For Goal Achievement: 09/06/17 Potential to Achieve Goals: Good Progress towards PT goals: Progressing toward goals    Frequency    7X/week      PT Plan Current plan remains appropriate    Co-evaluation              AM-PAC PT "6 Clicks" Daily Activity  Outcome Measure  Difficulty turning over in bed (including adjusting bedclothes, sheets and blankets)?: Unable Difficulty moving from lying on back to sitting on the side of the bed? :  Unable Difficulty sitting down on and standing up from a chair with arms (e.g., wheelchair, bedside commode, etc,.)?: Unable Help needed moving to and from a bed to chair (including a wheelchair)?: A Little Help needed walking in hospital room?: A Little Help needed climbing 3-5 steps with a railing? : A Little 6 Click Score: 12    End of Session Equipment Utilized During Treatment: Gait belt Activity Tolerance: Patient tolerated treatment well Patient left: in chair;with call bell/phone within reach;with family/visitor present   PT Visit Diagnosis: Unsteadiness on feet (R26.81);Other abnormalities of gait and mobility (R26.89);Muscle weakness (generalized) (M62.81);Pain Pain - Right/Left: Right Pain - part of body: Hip     Time: 0981-1914 PT Time Calculation (min) (ACUTE ONLY): 33 min  Charges:  $Gait Training: 23-37 mins                    G Codes:       Reinaldo Berber, PT, DPT Acute Rehab Services Pager: [phone]     Reinaldo Berber 08/31/2017, 2:26 PM

## 2017-08-31 NOTE — Care Management Note (Signed)
Case Management Note  Patient Details  Name: Barbara Soto MRN: [id number] Date of Birth: 30-Aug-1940  Subjective/Objective:  77 yr old female s/p right total knee arthroplasty.                  Action/Plan: Case manager spoke with patient's daughter concerning discharge plan. (Patient was on telephone). Choice for [place] was offered, referral was given to Josie Dixon, [facility]. Patient will have family support at discharge.  Her physical address is: [place] [place], [place].     Expected Discharge Date:  09/01/17  Expected Discharge Plan:  [place]  In-House Referral:  NA  Discharge planning Services  CM Consult  Post Acute Care Choice:  Home Health Choice offered to:  Patient  DME Arranged:  (Has rolling walker, 3in1) DME Agency:  NA  HH Arranged:  PT HH Agency:  [facility] [employer]  Status of Service:  Completed, signed off  If discussed at [employer], dates discussed:    Additional Comments:  Ninfa Meeker, RN 08/31/2017, 10:23 AM

## 2017-08-31 NOTE — Evaluation (Signed)
Occupational Therapy Evaluation Patient Details Name: Barbara Soto MRN: [id number] DOB: [birth year] Today's Date: 08/31/2017    History of Present Illness 77 y.o. female s/p R THA (anterior). PMH includes:  HTN, abdominal hysterectomy.    Clinical Impression   Pt admitted with the above diagnoses and presents with below problem list. Pt will benefit from continued acute OT to address the below listed deficits and maximize independence with basic ADLs prior to d/c home with daughter assisting. PTA pt was independent with ADLs. Pt is currently min to mod A with LB ADLs, min guard to min A with functional transfers, min guard for functional mobility. Daughter present and included in session. Pt tolerated session well.      Follow Up Recommendations  Home health OT;Supervision/Assistance - 24 hour    Equipment Recommendations       Recommendations for Other Services       Precautions / Restrictions Precautions Precautions: Fall Restrictions Weight Bearing Restrictions: Yes RLE Weight Bearing: Weight bearing as tolerated      Mobility Bed Mobility Overal bed mobility: Needs Assistance Bed Mobility: Supine to Sit     Supine to sit: Supervision     General bed mobility comments: Patient using sheet for RLE advacment in and out of bed due to pain.  Transfers Overall transfer level: Needs assistance Equipment used: Rolling walker (2 wheeled) Transfers: Sit to/from Stand Sit to Stand: Min guard;Min assist         General transfer comment: close min guard to min A to powerup to standing and control descent onto toilet.     Balance Overall balance assessment: Needs assistance Sitting-balance support: No upper extremity supported;Feet unsupported Sitting balance-Leahy Scale: Fair     Standing balance support: During functional activity;Bilateral upper extremity supported Standing balance-Leahy Scale: Poor                             ADL either performed  or assessed with clinical judgement   ADL Overall ADL's : Needs assistance/impaired Eating/Feeding: Sitting;Set up   Grooming: Min guard;Standing Grooming Details (indicate cue type and reason): close min guard for safety in standing Upper Body Bathing: Set up;Sitting   Lower Body Bathing: Moderate assistance;Sit to/from stand   Upper Body Dressing : Set up;Sitting   Lower Body Dressing: Moderate assistance;Sit to/from stand   Toilet Transfer: Minimal assistance;Ambulation;Min guard   Toileting- Clothing Manipulation and Hygiene: Moderate assistance;Sit to/from stand   Tub/ Banker: Moderate assistance;Ambulation   Functional mobility during ADLs: Min guard;Rolling walker General ADL Comments: Pt completed toilet transfer and pericare as detailed above. Encouraged to sit up in recliner for awhile.      Vision         Perception     Praxis      Pertinent Vitals/Pain Pain Assessment: Faces Faces Pain Scale: Hurts little more Pain Location: R knee and hip Pain Descriptors / Indicators: Aching;Grimacing;Operative site guarding Pain Intervention(s): Limited activity within patient's tolerance;Premedicated before session;Repositioned;Monitored during session     Hand Dominance     Extremity/Trunk Assessment Upper Extremity Assessment Upper Extremity Assessment: Generalized weakness;Overall [facility] for tasks assessed   Lower Extremity Assessment Lower Extremity Assessment: Defer to PT evaluation   Cervical / Trunk Assessment Cervical / Trunk Assessment: Normal   Communication Communication Communication: No difficulties   Cognition Arousal/Alertness: Awake/alert Behavior During Therapy: WFL for tasks assessed/performed Overall Cognitive Status: Within Functional Limits for tasks assessed  General Comments          Shoulder Instructions      Home Living Family/patient expects to be discharged to::  Private residence Living Arrangements: Children Available Help at Discharge: Family;Available 24 hours/day Type of Home: House Home Access: Stairs to enter [employer] of Steps: 2   Home Layout: One level     Bathroom Shower/Tub: Teacher, early years/pre: Standard Bathroom Accessibility: Yes   Home Equipment: Environmental consultant - 4 wheels;Bedside commode;Tub bench          Prior Functioning/Environment Level of Independence: Independent        Comments: mobility and ADLs        OT Problem List: Impaired balance (sitting and/or standing);Decreased knowledge of use of DME or AE;Decreased knowledge of precautions;Pain      OT Treatment/Interventions: Self-care/ADL training;DME and/or AE instruction;Therapeutic activities;Patient/family education;Balance training    OT Goals(Current goals can be found in the care plan section) Acute Rehab OT Goals Patient Stated Goal: return to daugthers home with 24 hour care OT Goal Formulation: With patient Time For Goal Achievement: 09/07/17 Potential to Achieve Goals: Good ADL Goals Pt Will Perform Lower Body Bathing: with supervision;with adaptive equipment;sit to/from stand Pt Will Perform Lower Body Dressing: with supervision;with adaptive equipment;sit to/from stand Pt Will Transfer to Toilet: with supervision;ambulating Pt Will Perform Toileting - Clothing Manipulation and hygiene: with supervision;with adaptive equipment;sit to/from stand Pt Will Perform Tub/Shower Transfer: Tub transfer;with min guard assist;ambulating;tub bench;3 in 1;rolling walker  OT Frequency: Min 2X/week   Barriers to D/C:            Co-evaluation              AM-PAC PT "6 Clicks" Daily Activity     Outcome Measure Help from another person eating meals?: None Help from another person taking care of personal grooming?: A Little Help from another person toileting, which includes using toliet, bedpan, or urinal?: A Little Help from  another person bathing (including washing, rinsing, drying)?: A Lot Help from another person to put on and taking off regular upper body clothing?: A Little Help from another person to put on and taking off regular lower body clothing?: A Lot 6 Click Score: 17   End of Session Equipment Utilized During Treatment: Gait belt;Rolling walker  Activity Tolerance: Patient tolerated treatment well Patient left: in chair;with call bell/phone within reach;with family/visitor present  OT Visit Diagnosis: Unsteadiness on feet (R26.81);History of falling (Z91.81);Pain                Time: 0973-5329 OT Time Calculation (min): 30 min Charges:  OT General Charges $OT Visit: 1 Visit OT Evaluation $OT Eval Low Complexity: 1 Low OT Treatments $Self Care/Home Management : 8-22 mins G-Codes:       Hortencia Pilar 08/31/2017, 11:41 AM

## 2017-08-31 NOTE — Progress Notes (Addendum)
Physical Therapy Treatment Patient Details Name: Barbara Soto MRN: [id number] DOB: 26-Sep-1940 Today's Date: 08/31/2017    History of Present Illness 77 y.o. female s/p R THA (anterior). PMH includes:  HTN, abdominal hysterectomy.     PT Comments    Patient progressing slowly with therapy at this time. No Nausea this session, and able to increase ambulation distance to 100 feet, with poor mechanics and unsteady balance requiring min guarding. Patients daughter not present this visit, but will be later and will educate on proper guarding and physical assistance in preperation for return to home. Unlikely patient will be ready for stair training this PM session and will need to address tomorrow, but will try if able.    Follow Up Recommendations  Home health PT;Supervision/Assistance - 24 hour     Equipment Recommendations  None recommended by PT    Recommendations for Other Services OT consult     Precautions / Restrictions Precautions Precautions: Fall Restrictions Weight Bearing Restrictions: (P) Yes RLE Weight Bearing: (P) Weight bearing as tolerated    Mobility  Bed Mobility Overal bed mobility: Needs Assistance Bed Mobility: Supine to Sit;Sit to Supine     Supine to sit: Supervision     General bed mobility comments: Patient using sheet for RLE advacment in and out of bed due to pain.  Transfers Overall transfer level: Needs assistance Equipment used: Rolling walker (2 wheeled) Transfers: Sit to/from Stand Sit to Stand: Min guard         General transfer comment: min guard, cues for hand placement with RW  Ambulation/Gait Ambulation/Gait assistance: Min guard Ambulation Distance (Feet): 100 Feet Assistive device: Rolling walker (2 wheeled) Gait Pattern/deviations: Step-to pattern;Antalgic Gait velocity: decreased   General Gait Details: Pt ambulating with antlagic gait, very short steps with unsteady balance at this time. Cues for safety with RW and  step length. pt unable to demonstrate   Stairs            Wheelchair Mobility    Modified Rankin (Stroke Patients Only)       Balance Overall balance assessment: Needs assistance Sitting-balance support: No upper extremity supported;Feet unsupported Sitting balance-Leahy Scale: Fair     Standing balance support: During functional activity;Bilateral upper extremity supported Standing balance-Leahy Scale: Poor                              Cognition Arousal/Alertness: Awake/alert Behavior During Therapy: WFL for tasks assessed/performed Overall Cognitive Status: Within Functional Limits for tasks assessed                                        Exercises Total Joint Exercises Ankle Circles/Pumps: 20 reps Quad Sets: 10 reps Heel Slides: 10 reps Hip ABduction/ADduction: 10 reps Knee Flexion: 10 reps Marching in Standing: 10 reps    General Comments        Pertinent Vitals/Pain      Home Living                      Prior Function            PT Goals (current goals can now be found in the care plan section) Acute Rehab PT Goals PT Goal Formulation: With patient Time For Goal Achievement: 09/06/17 Potential to Achieve Goals: Good    Frequency    7X/week  PT Plan Current plan remains appropriate    Co-evaluation              AM-PAC PT "6 Clicks" Daily Activity  Outcome Measure  Difficulty turning over in bed (including adjusting bedclothes, sheets and blankets)?: Unable Difficulty moving from lying on back to sitting on the side of the bed? : Unable Difficulty sitting down on and standing up from a chair with arms (e.g., wheelchair, bedside commode, etc,.)?: Unable Help needed moving to and from a bed to chair (including a wheelchair)?: A Little Help needed walking in hospital room?: A Little Help needed climbing 3-5 steps with a railing? : A Lot 6 Click Score: 11    End of Session Equipment  Utilized During Treatment: Gait belt Activity Tolerance: Patient limited by fatigue Patient left: in chair;with call bell/phone within reach;with family/visitor present Nurse Communication: Mobility status PT Visit Diagnosis: Unsteadiness on feet (R26.81);Other abnormalities of gait and mobility (R26.89);Muscle weakness (generalized) (M62.81);Pain Pain - Right/Left: Right Pain - part of body: Hip     Time: 0825-0850 PT Time Calculation (min) (ACUTE ONLY): 25 min  Charges:  $Gait Training: 8-22 mins $Therapeutic Exercise: 8-22 mins                    G Codes:       Reinaldo Berber, PT, DPT Acute Rehab Services Pager: [phone]     Reinaldo Berber 08/31/2017, 9:00 AM

## 2017-09-01 DIAGNOSIS — D62 Acute posthemorrhagic anemia: Secondary | ICD-10-CM

## 2017-09-01 LAB — CBC
HCT: 19.3 % — ABNORMAL LOW (ref 36.0–46.0)
HEMOGLOBIN: 6.4 g/dL — AB (ref 12.0–15.0)
MCH: 30.2 pg (ref 26.0–34.0)
MCHC: 33.2 g/dL (ref 30.0–36.0)
MCV: 91 fL (ref 78.0–100.0)
Platelets: 130 10*3/uL — ABNORMAL LOW (ref 150–400)
RBC: 2.12 MIL/uL — AB (ref 3.87–5.11)
RDW: 13.8 % (ref 11.5–15.5)
WBC: 7.5 10*3/uL (ref 4.0–10.5)

## 2017-09-01 MED ORDER — DOCUSATE SODIUM 100 MG PO CAPS: 100.0000 mg | ORAL_CAPSULE | Freq: Two times a day (BID) | ORAL | 0 refills | Status: DC

## 2017-09-01 MED ORDER — TIZANIDINE HCL 4 MG PO TABS: 4.0000 mg | ORAL_TABLET | Freq: Four times a day (QID) | ORAL | 1 refills | Status: DC | PRN

## 2017-09-01 MED ORDER — ASPIRIN 325 MG PO TBEC: 325.0000 mg | DELAYED_RELEASE_TABLET | Freq: Two times a day (BID) | ORAL | 0 refills | Status: DC

## 2017-09-01 MED ORDER — HYDROCODONE-ACETAMINOPHEN 5-325 MG PO TABS: 1.0000 | ORAL_TABLET | ORAL | 0 refills | Status: DC | PRN

## 2017-09-01 MED ORDER — BISACODYL 5 MG PO TBEC: 5.0000 mg | DELAYED_RELEASE_TABLET | Freq: Every day | ORAL | 0 refills | Status: DC | PRN

## 2017-09-01 NOTE — Progress Notes (Signed)
Subjective: 3 Days Post-Op Procedure(s) (LRB): TOTAL HIP ARTHROPLASTY ANTERIOR APPROACH (Right)   Patient feels much better and wishes to go home. She did very well with PT yesterday and no longer feels sick or lightheaded and dizzy  Activity level:  wbat Diet tolerance:  ok Voiding:  ok Patient reports pain as mild.    Objective: Vital signs in last 24 hours: Temp:  [97.6 F (36.4 C)-99.8 F (37.7 C)] 97.6 F (36.4 C) (02/01 0553) Pulse Rate:  [77-97] 77 (02/01 0553) Resp:  [16] 16 (02/01 0553) BP: (97-105)/(40-47) 97/40 (02/01 0553) SpO2:  [95 %-97 %] 95 % (02/01 0553)  Labs: Recent Labs    08/29/17 1440 08/30/17 0648 08/31/17 0800  HGB 10.2* 9.1* 7.2*   Recent Labs    08/30/17 0648 08/31/17 0800  WBC 13.5* 10.5  RBC 2.89* 2.40*  HCT 26.4* 21.8*  PLT 236 172   Recent Labs    08/29/17 1440 08/30/17 0648  NA 142 139  K 4.4 4.0  CL  --  106  CO2  --  24  BUN  --  17  CREATININE  --  0.96  GLUCOSE 111* 171*  CALCIUM  --  8.4*   No results for input(s): LABPT, INR in the last 72 hours.  Physical Exam:  Neurologically intact ABD soft Neurovascular intact Sensation intact distally Intact pulses distally Dorsiflexion/Plantar flexion intact Incision: dressing C/D/I and no drainage No cellulitis present Compartment soft  Assessment/Plan:  3 Days Post-Op Procedure(s) (LRB): TOTAL HIP ARTHROPLASTY ANTERIOR APPROACH (Right) Advance diet Up with therapy Discharge home with home health after PT this morning. Her Hgb is 7.2 but she is asymptomatic and feels great. She states taht her blood normally runs low and that she would prefer not to have any blood. She will take and Iron supplement at home. Continue on ASA 325mg  BID x 4 weeks post op. Follow up in office 2 weeks post op.   Allayah Raineri, Larwance Sachs 09/01/2017, 8:14 AM

## 2017-09-01 NOTE — Discharge Summary (Signed)
Patient ID: Barbara Soto MRN: [id number] DOB/AGE: 10-20-1940 77 y.o.  Admit date: 08/29/2017 Discharge date: 09/01/2017  Admission Diagnoses:  Principal Problem:   Primary osteoarthritis of right hip Active Problems:   Acute blood loss as cause of postoperative anemia   Discharge Diagnoses:  Same  Past Medical History:  Diagnosis Date  . Complication of anesthesia   . DJD (degenerative joint disease)    right hip  . Hypertension   . PONV (postoperative nausea and vomiting)   . Wears dentures   . Wears glasses     Surgeries: Procedure(s): TOTAL HIP ARTHROPLASTY ANTERIOR APPROACH on 08/29/2017   Consultants:   Discharged Condition: Improved  Hospital Course: Barbara Soto is an 77 y.o. female who was admitted 08/29/2017 for operative treatment ofPrimary osteoarthritis of right hip. Patient has severe unremitting pain that affects sleep, daily activities, and work/hobbies. After pre-op clearance the patient was taken to the operating room on 08/29/2017 and underwent  Procedure(s): TOTAL HIP ARTHROPLASTY ANTERIOR APPROACH.    Patient was given perioperative antibiotics:  Anti-infectives (From admission, onward)   Start     Dose/Rate Route Frequency Ordered Stop   08/29/17 2100  ceFAZolin (ANCEF) IVPB 2g/100 mL premix     2 g 200 mL/hr over 30 Minutes Intravenous Every 6 hours 08/29/17 2027 08/30/17 0722   08/29/17 1113  ceFAZolin (ANCEF) 2-4 GM/100ML-% IVPB    Comments:  Block, Sarah   : cabinet override      08/29/17 1113 08/29/17 2329       Patient was given sequential compression devices, early ambulation, and chemoprophylaxis to prevent DVT.  Patient benefited maximally from hospital stay and there were no complications.    Recent vital signs:  Patient Vitals for the past 24 hrs:  BP Temp Temp src Pulse Resp SpO2  09/01/17 0553 (!) 97/40 97.6 F (36.4 C) Oral 77 16 95 %  08/31/17 2137 (!) 97/46 98.9 F (37.2 C) Oral 89 16 97 %  08/31/17 1437 (!) 105/47 99.8  F (37.7 C) Oral 97 - 97 %     Recent laboratory studies:  Recent Labs    08/29/17 1440  08/30/17 0648 08/31/17 0800 09/01/17 0610  WBC  --    < > 13.5* 10.5 7.5  HGB 10.2*  --  9.1* 7.2* 6.4*  HCT 30.0*  --  26.4* 21.8* 19.3*  PLT  --    < > 236 172 130*  NA 142  --  139  --   --   K 4.4  --  4.0  --   --   CL  --   --  106  --   --   CO2  --   --  24  --   --   BUN  --   --  17  --   --   CREATININE  --   --  0.96  --   --   GLUCOSE 111*  --  171*  --   --   CALCIUM  --   --  8.4*  --   --    < > = values in this interval not displayed.     Discharge Medications:   Allergies as of 09/01/2017      Reactions   Adhesive [tape] Other (See Comments)   Takes skin off    Penicillins Rash, Other (See Comments)   PATIENT HAS HAD A PCN REACTION WITH IMMEDIATE RASH, FACIAL/TONGUE/THROAT SWELLING, SOB, OR LIGHTHEADEDNESS WITH HYPOTENSION:  #  #  #  YES  #  #  #   Has patient had a PCN reaction causing severe rash involving mucus membranes or skin necrosis: No Has patient had a PCN reaction that required hospitalization: No Has patient had a PCN reaction occurring within the last 10 years: No      Medication List    STOP taking these medications   ibuprofen 200 MG tablet Commonly known as:  ADVIL,MOTRIN   naproxen sodium 220 MG tablet Commonly known as:  ALEVE     TAKE these medications   aspirin 325 MG EC tablet Take 1 tablet (325 mg total) by mouth 2 (two) times daily after a meal. What changed:    medication strength  how much to take  when to take this   bisacodyl 5 MG EC tablet Commonly known as:  DULCOLAX Take 1 tablet (5 mg total) by mouth daily as needed for moderate constipation.   docusate sodium 100 MG capsule Commonly known as:  COLACE Take 1 capsule (100 mg total) by mouth 2 (two) times daily.   HYDROcodone-acetaminophen 5-325 MG tablet Commonly known as:  NORCO/VICODIN Take 1-2 tablets by mouth every 4 (four) hours as needed. What changed:     how much to take  when to take this  reasons to take this   lisinopril 10 MG tablet Commonly known as:  PRINIVIL,ZESTRIL Take 10 mg by mouth as directed. Written daily, but patient says she takes it "a couple times a week".   tiZANidine 4 MG tablet Commonly known as:  ZANAFLEX Take 1 tablet (4 mg total) by mouth every 6 (six) hours as needed for muscle spasms.            Durable Medical Equipment  (From admission, onward)        Start     Ordered   08/29/17 2028  DME Walker rolling  Once    Question:  Patient needs a walker to treat with the following condition  Answer:  Primary osteoarthritis of right hip   08/29/17 2027   08/29/17 2028  DME 3 n 1  Once     08/29/17 2027   08/29/17 2028  DME Bedside commode  Once    Question:  Patient needs a bedside commode to treat with the following condition  Answer:  Primary osteoarthritis of right hip   08/29/17 2027      Diagnostic Studies: Dg Chest 2 View  Result Date: 08/28/2017 CLINICAL DATA:  Preop for total hip arthroplasty. EXAM: CHEST  2 VIEW COMPARISON:  Radiographs of June 06, 2013. FINDINGS: The heart size and mediastinal contours are within normal limits. Both lungs are clear. The visualized skeletal structures are unremarkable. IMPRESSION: No active cardiopulmonary disease. Electronically Signed   By: Marijo Conception, M.D.   On: 08/28/2017 15:33   Dg Pelvis Portable  Result Date: 08/29/2017 CLINICAL DATA:  Postop day 0 anterior approach right total hip arthroplasty. EXAM: PORTABLE PELVIS 1-2 VIEWS 5:44 p.m.: COMPARISON:  Intraoperative x-rays earlier today at 3:14 p.m. AP pelvis x-ray 06/30/2008. FINDINGS: Anatomic alignment in the AP projection post right total hip arthroplasty. No acute complicating features. Moderate joint space narrowing involving the contralateral left hip joint. IMPRESSION: Anatomic alignment in the AP projection post right total hip arthroplasty without acute complicating features.  Electronically Signed   By: Evangeline Dakin M.D.   On: 08/29/2017 20:06   Dg C-arm 1-60 Min  Result Date: 08/29/2017 CLINICAL DATA:  Anterior hip arthroplasty. EXAM: DG C-ARM 61-120 MIN;  OPERATIVE RIGHT HIP WITH PELVIS COMPARISON:  No recent prior. FINDINGS: Total right hip replacement with anatomic alignment. Hardware intact. Four images obtained. 0 minutes 56 seconds fluoroscopy time. IMPRESSION: Replacement with anatomic alignment. Electronically Signed   By: Marcello Moores  Register   On: 08/29/2017 17:00   Dg C-arm 1-60 Min  Result Date: 08/29/2017 CLINICAL DATA:  Anterior hip arthroplasty. EXAM: DG C-ARM 61-120 MIN; OPERATIVE RIGHT HIP WITH PELVIS COMPARISON:  No recent prior. FINDINGS: Total right hip replacement with anatomic alignment. Hardware intact. Four images obtained. 0 minutes 56 seconds fluoroscopy time. IMPRESSION: Replacement with anatomic alignment. Electronically Signed   By: Marcello Moores  Register   On: 08/29/2017 17:00   Dg Hip Operative Unilat With Pelvis Right  Result Date: 08/29/2017 CLINICAL DATA:  Anterior hip arthroplasty. EXAM: DG C-ARM 61-120 MIN; OPERATIVE RIGHT HIP WITH PELVIS COMPARISON:  No recent prior. FINDINGS: Total right hip replacement with anatomic alignment. Hardware intact. Four images obtained. 0 minutes 56 seconds fluoroscopy time. IMPRESSION: Replacement with anatomic alignment. Electronically Signed   By: Marcello Moores  Register   On: 08/29/2017 17:00    Disposition: 01-Home or Self Care  Discharge Instructions    Call MD / Call 911   Complete by:  As directed    If you experience chest pain or shortness of breath, CALL 911 and be transported to the hospital emergency room.  If you develope a fever above 101 F, pus (white drainage) or increased drainage or redness at the wound, or calf pain, call your surgeon's office.   Constipation Prevention   Complete by:  As directed    Drink plenty of fluids.  Prune juice may be helpful.  You may use a stool softener, such as  Colace (over the counter) 100 mg twice a day.  Use MiraLax (over the counter) for constipation as needed.   Diet - low sodium heart healthy   Complete by:  As directed    Discharge instructions   Complete by:  As directed    INSTRUCTIONS AFTER JOINT REPLACEMENT   Remove items at home which could result in a fall. This includes throw rugs or furniture in walking pathways ICE to the affected joint every three hours while awake for 30 minutes at a time, for at least the first 3-5 days, and then as needed for pain and swelling.  Continue to use ice for pain and swelling. You may notice swelling that will progress down to the foot and ankle.  This is normal after surgery.  Elevate your leg when you are not up walking on it.   Continue to use the breathing machine you got in the hospital (incentive spirometer) which will help keep your temperature down.  It is common for your temperature to cycle up and down following surgery, especially at night when you are not up moving around and exerting yourself.  The breathing machine keeps your lungs expanded and your temperature down.   DIET:  As you were doing prior to hospitalization, we recommend a well-balanced diet.  DRESSING / WOUND CARE / SHOWERING  You may shower 3 days after surgery, but keep the wounds dry during showering.  You may use an occlusive plastic wrap (Press'n Seal for example), NO SOAKING/SUBMERGING IN THE BATHTUB.  If the bandage gets wet, change with a clean dry gauze.  If the incision gets wet, pat the wound dry with a clean towel.  ACTIVITY  Increase activity slowly as tolerated, but follow the weight bearing instructions below.   No  driving for 6 weeks or until further direction given by your physician.  You cannot drive while taking narcotics.  No lifting or carrying greater than 10 lbs. until further directed by your surgeon. Avoid periods of inactivity such as sitting longer than an hour when not asleep. This helps prevent  blood clots.  You may return to work once you are authorized by your doctor.     WEIGHT BEARING   Weight bearing as tolerated with assist device (walker, cane, etc) as directed, use it as long as suggested by your surgeon or therapist, typically at least 4-6 weeks.   EXERCISES  Results after joint replacement surgery are often greatly improved when you follow the exercise, range of motion and muscle strengthening exercises prescribed by your doctor. Safety measures are also important to protect the joint from further injury. Any time any of these exercises cause you to have increased pain or swelling, decrease what you are doing until you are comfortable again and then slowly increase them. If you have problems or questions, call your caregiver or physical therapist for advice.   Rehabilitation is important following a joint replacement. After just a few days of immobilization, the muscles of the leg can become weakened and shrink (atrophy).  These exercises are designed to build up the tone and strength of the thigh and leg muscles and to improve motion. Often times heat used for twenty to thirty minutes before working out will loosen up your tissues and help with improving the range of motion but do not use heat for the first two weeks following surgery (sometimes heat can increase post-operative swelling).   These exercises can be done on a training (exercise) mat, on the floor, on a table or on a bed. Use whatever works the best and is most comfortable for you.    Use music or television while you are exercising so that the exercises are a pleasant break in your day. This will make your life better with the exercises acting as a break in your routine that you can look forward to.   Perform all exercises about fifteen times, three times per day or as directed.  You should exercise both the operative leg and the other leg as well.   Exercises include:   Quad Sets - Tighten up the muscle on the  front of the thigh (Quad) and hold for 5-10 seconds.   Straight Leg Raises - With your knee straight (if you were given a brace, keep it on), lift the leg to 60 degrees, hold for 3 seconds, and slowly lower the leg.  Perform this exercise against resistance later as your leg gets stronger.  Leg Slides: Lying on your back, slowly slide your foot toward your buttocks, bending your knee up off the floor (only go as far as is comfortable). Then slowly slide your foot back down until your leg is flat on the floor again.  Angel Wings: Lying on your back spread your legs to the side as far apart as you can without causing discomfort.  Hamstring Strength:  Lying on your back, push your heel against the floor with your leg straight by tightening up the muscles of your buttocks.  Repeat, but this time bend your knee to a comfortable angle, and push your heel against the floor.  You may put a pillow under the heel to make it more comfortable if necessary.   A rehabilitation program following joint replacement surgery can speed recovery and prevent re-injury in  the future due to weakened muscles. Contact your doctor or a physical therapist for more information on knee rehabilitation.    CONSTIPATION  Constipation is defined medically as fewer than three stools per week and severe constipation as less than one stool per week.  Even if you have a regular bowel pattern at home, your normal regimen is likely to be disrupted due to multiple reasons following surgery.  Combination of anesthesia, postoperative narcotics, change in appetite and fluid intake all can affect your bowels.   YOU MUST use at least one of the following options; they are listed in order of increasing strength to get the job done.  They are all available over the counter, and you may need to use some, POSSIBLY even all of these options:    Drink plenty of fluids (prune juice may be helpful) and high fiber foods Colace 100 mg by mouth twice a day   Senokot for constipation as directed and as needed Dulcolax (bisacodyl), take with full glass of water  Miralax (polyethylene glycol) once or twice a day as needed.  If you have tried all these things and are unable to have a bowel movement in the first 3-4 days after surgery call either your surgeon or your primary doctor.    If you experience loose stools or diarrhea, hold the medications until you stool forms back up.  If your symptoms do not get better within 1 week or if they get worse, check with your doctor.  If you experience "the worst abdominal pain ever" or develop nausea or vomiting, please contact the office immediately for further recommendations for treatment.   ITCHING:  If you experience itching with your medications, try taking only a single pain pill, or even half a pain pill at a time.  You can also use Benadryl over the counter for itching or also to help with sleep.   TED HOSE STOCKINGS:  Use stockings on both legs until for at least 2 weeks or as directed by physician office. They may be removed at night for sleeping.  MEDICATIONS:  See your medication summary on the "After Visit Summary" that nursing will review with you.  You may have some home medications which will be placed on hold until you complete the course of blood thinner medication.  It is important for you to complete the blood thinner medication as prescribed.  PRECAUTIONS:  If you experience chest pain or shortness of breath - call 911 immediately for transfer to the hospital emergency department.   If you develop a fever greater that 101 F, purulent drainage from wound, increased redness or drainage from wound, foul odor from the wound/dressing, or calf pain - CONTACT YOUR SURGEON.                                                   FOLLOW-UP APPOINTMENTS:  If you do not already have a post-op appointment, please call the office for an appointment to be seen by your surgeon.  Guidelines for how soon to be seen  are listed in your "After Visit Summary", but are typically between 1-4 weeks after surgery.  OTHER INSTRUCTIONS:   Knee Replacement:  Do not place pillow under knee, focus on keeping the knee straight while resting. CPM instructions: 0-90 degrees, 2 hours in the morning, 2 hours in the afternoon,  and 2 hours in the evening. Place foam block, curve side up under heel at all times except when in CPM or when walking.  DO NOT modify, tear, cut, or change the foam block in any way.  MAKE SURE YOU:  Understand these instructions.  Get help right away if you are not doing well or get worse.    Thank you for letting us be a part of your medical care team.  It is a privilege we respect greatly.  We hope these instructions will help you stay on track for a fast and full recovery!   Increase activity slowly as tolerated   Complete by:  As directed       Follow-up Information    Melrose Nakayama, MD. Schedule an appointment as soon as possible for a visit in 2 week(s).   Specialty:  Orthopedic Surgery Contact information: [place] [address] [phone]        [facility] Follow up.   Specialty:  [place] Why:  A representative from [facility] will contact you to arrange start date and time for your therapy. Contact information: [address] [place] [address] [phone]            Signed: Rich Fuchs 09/01/2017, 8:19 AM

## 2017-09-01 NOTE — Progress Notes (Signed)
Physical Therapy Treatment Patient Details Name: Barbara Soto MRN: [id number] DOB: 12/22/1940 Today's Date: 09/01/2017    History of Present Illness 77 y.o. female s/p R THA (anterior). PMH includes:  HTN, abdominal hysterectomy.     PT Comments    Pt has progressed well with therapy over last 2 visits. Now close supervision level for mobility and min guard for stairs. Daughter reports she is comfortable with stand by assistance for patient and guarding on stairs. Pt and family educated on safety considerations for home and have no further questions or concerns at this time. Pt has met all functional goals and will benefit from skilled home health PT when medically cleared for d/c.     Follow Up Recommendations  Home health PT;Supervision/Assistance - 24 hour     Equipment Recommendations  None recommended by PT    Recommendations for Other Services       Precautions / Restrictions Precautions Precautions: Fall Restrictions Weight Bearing Restrictions: Yes RLE Weight Bearing: Weight bearing as tolerated    Mobility  Bed Mobility Overal bed mobility: Needs Assistance Bed Mobility: Supine to Sit     Supine to sit: Supervision     General bed mobility comments: able to supine to sit without physical assistance or sheet for RLE today.  Transfers Overall transfer level: Needs assistance Equipment used: Rolling walker (2 wheeled) Transfers: Sit to/from Stand Sit to Stand: Supervision         General transfer comment: supervision now for safety  Ambulation/Gait Ambulation/Gait assistance: Min guard;Supervision Ambulation Distance (Feet): 100 Feet Assistive device: Rolling walker (2 wheeled) Gait Pattern/deviations: Step-to pattern;Antalgic Gait velocity: decreased   General Gait Details: ambulating further distances with trunk flexed posture, antlagic and short step length BL. patient unsteady, requires min guard to close supervision at best for safety.     Stairs Stairs: Yes   Stair Management: Backwards;With walker Number of Stairs: 12 General stair comments: session focused on family education for guarding and sequencing. pt able to perform stairs with RW with proper sequencing with RW.    Wheelchair Mobility    Modified Rankin (Stroke Patients Only)       Balance Overall balance assessment: Needs assistance Sitting-balance support: No upper extremity supported;Feet unsupported Sitting balance-Leahy Scale: Fair     Standing balance support: During functional activity;Bilateral upper extremity supported Standing balance-Leahy Scale: Fair                              Cognition Arousal/Alertness: Awake/alert Behavior During Therapy: WFL for tasks assessed/performed Overall Cognitive Status: Within Functional Limits for tasks assessed                                        Exercises      General Comments General comments (skin integrity, edema, etc.): Reviwed therex with family. discussed goals for rehab moving forward.       Pertinent Vitals/Pain Pain Assessment: Faces Faces Pain Scale: Hurts little more Pain Location: R knee and hip Pain Descriptors / Indicators: Aching;Grimacing;Operative site guarding Pain Intervention(s): Limited activity within patient's tolerance;Monitored during session;Premedicated before session    Home Living                      Prior Function            PT Goals (current goals  can now be found in the care plan section) Acute Rehab PT Goals Patient Stated Goal: return to daugthers home with 24 hour care PT Goal Formulation: With patient Time For Goal Achievement: 09/06/17 Potential to Achieve Goals: Good Progress towards PT goals: Goals met/education completed, patient discharged from PT    Frequency           PT Plan Current plan remains appropriate    Co-evaluation              AM-PAC PT "6 Clicks" Daily Activity   Outcome Measure  Difficulty turning over in bed (including adjusting bedclothes, sheets and blankets)?: A Lot Difficulty moving from lying on back to sitting on the side of the bed? : A Lot Difficulty sitting down on and standing up from a chair with arms (e.g., wheelchair, bedside commode, etc,.)?: A Lot Help needed moving to and from a bed to chair (including a wheelchair)?: A Little Help needed walking in hospital room?: A Little Help needed climbing 3-5 steps with a railing? : A Little 6 Click Score: 15    End of Session Equipment Utilized During Treatment: Gait belt Activity Tolerance: Patient tolerated treatment well Patient left: in chair;with call bell/phone within reach;with family/visitor present Nurse Communication: Mobility status PT Visit Diagnosis: Unsteadiness on feet (R26.81);Other abnormalities of gait and mobility (R26.89);Muscle weakness (generalized) (M62.81);Pain Pain - Right/Left: Right Pain - part of body: Hip     Time: 0840-0900 PT Time Calculation (min) (ACUTE ONLY): 20 min  Charges:  $Gait Training: 8-22 mins                    G Codes:       Reinaldo Berber, PT, DPT Acute Rehab Services Pager: [phone]     Reinaldo Berber 09/01/2017, 9:08 AM

## 2017-09-01 NOTE — Care Management Important Message (Signed)
Important Message  Patient Details  Name: Barbara Soto MRN: [id number] Date of Birth: Feb 27, 1941   Medicare Important Message Given:  Yes    Carles Collet, RN 09/01/2017, 11:44 AM

## 2017-09-01 NOTE — Plan of Care (Signed)
Pt is a/o x4. VSS. Ra. Bilateral breath sounds clear. Bowel sounds present. No Bm. Voiding. Falls precautions maintained. Aspirin for VTE. Able to turn self. Plan to d/c home today. Worked with PT today. Pain tx x1 PRN. Will continue to monitor.

## 2017-09-01 NOTE — Progress Notes (Signed)
Occupational Therapy Treatment Patient Details Name: Barbara Soto MRN: [id number] DOB: Jul 21, 1941 Today's Date: 09/01/2017    History of present illness 77 y.o. female s/p R THA (anterior). PMH includes:  HTN, abdominal hysterectomy.    OT comments  Pt progressing towards acute OT goals. Focus of session was toilet transfer and pericare. Reviewed LB ADL strategies. Educated pt and daughter on tub transfer, provided handout. Pt reporting nausea 2x during session. Nursing notified. D/c plan remains appropriate.   Follow Up Recommendations  Home health OT;Supervision/Assistance - 24 hour    Equipment Recommendations       Recommendations for Other Services      Precautions / Restrictions Precautions Precautions: Fall Restrictions Weight Bearing Restrictions: Yes RLE Weight Bearing: Weight bearing as tolerated       Mobility Bed Mobility Overal bed mobility: Needs Assistance Bed Mobility: Supine to Sit     Supine to sit: Supervision     General bed mobility comments: up in chair  Transfers Overall transfer level: Needs assistance Equipment used: Rolling walker (2 wheeled) Transfers: Sit to/from Stand Sit to Stand: Supervision         General transfer comment: supervision now for safety    Balance Overall balance assessment: Needs assistance Sitting-balance support: No upper extremity supported;Feet unsupported Sitting balance-Leahy Scale: Fair     Standing balance support: During functional activity;Bilateral upper extremity supported Standing balance-Leahy Scale: Fair Standing balance comment: 1 LOB walking across threshold in to bathroom                           ADL either performed or assessed with clinical judgement   ADL Overall ADL's : Needs assistance/impaired                         Toilet Transfer: Minimal assistance;Ambulation;Min guard Toilet Transfer Details (indicate cue type and reason): mostly min guard. 1 LOB  walking over threshold into bathoom with min A to correct balance. cues for technique to control descent onto toilet.  Toileting- Clothing Manipulation and Hygiene: Minimal assistance;Sit to/from stand Toileting - Clothing Manipulation Details (indicate cue type and reason): steadying assist while pt completed pericare in standing   Tub/Shower Transfer Details (indicate cue type and reason): provided handout and verbally reviewed with daughter at end of session, pt nauseous. Functional mobility during ADLs: Min guard;Rolling walker General ADL Comments: toilet transfer, reviewed LB ADL strategies, educated on tub transfer     Vision       Perception     Praxis      Cognition Arousal/Alertness: Awake/alert Behavior During Therapy: WFL for tasks assessed/performed Overall Cognitive Status: Within Functional Limits for tasks assessed                                          Exercises     Shoulder Instructions       General Comments Reviwed therex with family. discussed goals for rehab moving forward.     Pertinent Vitals/ Pain       Pain Assessment: Faces Faces Pain Scale: Hurts little more Pain Location: R knee and hip Pain Descriptors / Indicators: Aching;Sore Pain Intervention(s): Premedicated before session;Monitored during session;Limited activity within patient's tolerance;Repositioned  Home Living  Prior Functioning/Environment              Frequency  Min 2X/week        Progress Toward Goals  OT Goals(current goals can now be found in the care plan section)  Progress towards OT goals: Progressing toward goals  Acute Rehab OT Goals Patient Stated Goal: return to daugthers home with 24 hour care OT Goal Formulation: With patient Time For Goal Achievement: 09/07/17 Potential to Achieve Goals: Good ADL Goals Pt Will Perform Lower Body Bathing: with supervision;with adaptive  equipment;sit to/from stand Pt Will Perform Lower Body Dressing: with supervision;with adaptive equipment;sit to/from stand Pt Will Transfer to Toilet: with supervision;ambulating Pt Will Perform Toileting - Clothing Manipulation and hygiene: with supervision;with adaptive equipment;sit to/from stand Pt Will Perform Tub/Shower Transfer: Tub transfer;with min guard assist;ambulating;tub bench;3 in 1;rolling walker  Plan Discharge plan remains appropriate    Co-evaluation                 AM-PAC PT "6 Clicks" Daily Activity     Outcome Measure   Help from another person eating meals?: None Help from another person taking care of personal grooming?: A Little Help from another person toileting, which includes using toliet, bedpan, or urinal?: A Little Help from another person bathing (including washing, rinsing, drying)?: A Lot Help from another person to put on and taking off regular upper body clothing?: A Little Help from another person to put on and taking off regular lower body clothing?: A Lot 6 Click Score: 17    End of Session Equipment Utilized During Treatment: Rolling walker  OT Visit Diagnosis: Unsteadiness on feet (R26.81);History of falling (Z91.81);Pain   Activity Tolerance Other (comment)(nauseous, recently taken oral pain meds)   Patient Left in chair;with call bell/phone within reach;with family/visitor present   Nurse Communication Other (comment)(nausea)        Time: 1610-9604 OT Time Calculation (min): 20 min  Charges: OT General Charges $OT Visit: 1 Visit OT Treatments $Self Care/Home Management : 8-22 mins    Hortencia Pilar 09/01/2017, 11:28 AM

## 2017-09-02 DIAGNOSIS — D649 Anemia, unspecified: Secondary | ICD-10-CM | POA: Diagnosis not present

## 2017-09-02 DIAGNOSIS — Z471 Aftercare following joint replacement surgery: Secondary | ICD-10-CM | POA: Diagnosis not present

## 2017-09-02 DIAGNOSIS — Z96641 Presence of right artificial hip joint: Secondary | ICD-10-CM | POA: Diagnosis not present

## 2017-09-02 DIAGNOSIS — Z9181 History of falling: Secondary | ICD-10-CM | POA: Diagnosis not present

## 2017-09-02 DIAGNOSIS — I10 Essential (primary) hypertension: Secondary | ICD-10-CM | POA: Diagnosis not present

## 2017-09-02 DIAGNOSIS — Z7982 Long term (current) use of aspirin: Secondary | ICD-10-CM | POA: Diagnosis not present

## 2017-09-04 ENCOUNTER — Other Ambulatory Visit: Payer: Self-pay

## 2017-09-04 ENCOUNTER — Inpatient Hospital Stay (HOSPITAL_COMMUNITY)
Admission: EM | Admit: 2017-09-04 | Discharge: 2017-09-06 | DRG: 812 | Disposition: A | Discharge status: Home Health | Payer: PPO | Attending: Family Medicine | Admitting: Family Medicine

## 2017-09-04 ENCOUNTER — Encounter (HOSPITAL_COMMUNITY): Payer: Self-pay | Admitting: Emergency Medicine

## 2017-09-04 DIAGNOSIS — I951 Orthostatic hypotension: Secondary | ICD-10-CM | POA: Diagnosis present

## 2017-09-04 DIAGNOSIS — Z96641 Presence of right artificial hip joint: Secondary | ICD-10-CM | POA: Diagnosis present

## 2017-09-04 DIAGNOSIS — I10 Essential (primary) hypertension: Secondary | ICD-10-CM | POA: Diagnosis present

## 2017-09-04 DIAGNOSIS — Z7982 Long term (current) use of aspirin: Secondary | ICD-10-CM | POA: Diagnosis not present

## 2017-09-04 DIAGNOSIS — Z91048 Other nonmedicinal substance allergy status: Secondary | ICD-10-CM | POA: Diagnosis not present

## 2017-09-04 DIAGNOSIS — D649 Anemia, unspecified: Secondary | ICD-10-CM | POA: Diagnosis present

## 2017-09-04 DIAGNOSIS — R404 Transient alteration of awareness: Secondary | ICD-10-CM | POA: Diagnosis not present

## 2017-09-04 DIAGNOSIS — R112 Nausea with vomiting, unspecified: Secondary | ICD-10-CM | POA: Diagnosis present

## 2017-09-04 DIAGNOSIS — Z880 Allergy status to penicillin: Secondary | ICD-10-CM | POA: Diagnosis not present

## 2017-09-04 DIAGNOSIS — E876 Hypokalemia: Secondary | ICD-10-CM | POA: Diagnosis present

## 2017-09-04 DIAGNOSIS — R531 Weakness: Secondary | ICD-10-CM | POA: Diagnosis not present

## 2017-09-04 DIAGNOSIS — Z79899 Other long term (current) drug therapy: Secondary | ICD-10-CM

## 2017-09-04 DIAGNOSIS — E860 Dehydration: Secondary | ICD-10-CM | POA: Diagnosis present

## 2017-09-04 DIAGNOSIS — D62 Acute posthemorrhagic anemia: Secondary | ICD-10-CM | POA: Diagnosis not present

## 2017-09-04 DIAGNOSIS — R1110 Vomiting, unspecified: Secondary | ICD-10-CM

## 2017-09-04 LAB — COMPREHENSIVE METABOLIC PANEL
ALBUMIN: 2.8 g/dL — AB (ref 3.5–5.0)
ALT: 22 U/L (ref 14–54)
AST: 37 U/L (ref 15–41)
Alkaline Phosphatase: 64 U/L (ref 38–126)
Anion gap: 12 (ref 5–15)
BUN: 15 mg/dL (ref 6–20)
CHLORIDE: 101 mmol/L (ref 101–111)
CO2: 26 mmol/L (ref 22–32)
CREATININE: 0.71 mg/dL (ref 0.44–1.00)
Calcium: 8 mg/dL — ABNORMAL LOW (ref 8.9–10.3)
GFR calc Af Amer: >60 mL/min (ref >60)
GLUCOSE: 117 mg/dL — AB (ref 65–99)
Potassium: 3.1 mmol/L — ABNORMAL LOW (ref 3.5–5.1)
SODIUM: 139 mmol/L (ref 135–145)
Total Bilirubin: 1 mg/dL (ref 0.3–1.2)
Total Protein: 5.9 g/dL — ABNORMAL LOW (ref 6.5–8.1)

## 2017-09-04 LAB — CBC WITH DIFFERENTIAL/PLATELET
Basophils Absolute: 0 10*3/uL (ref 0.0–0.1)
Basophils Relative: 0 %
EOS PCT: 1 %
Eosinophils Absolute: 0.1 10*3/uL (ref 0.0–0.7)
HCT: 22.9 % — ABNORMAL LOW (ref 36.0–46.0)
HEMOGLOBIN: 7.9 g/dL — AB (ref 12.0–15.0)
LYMPHS ABS: 1.2 10*3/uL (ref 0.7–4.0)
LYMPHS PCT: 14 %
MCH: 30.7 pg (ref 26.0–34.0)
MCHC: 34.5 g/dL (ref 30.0–36.0)
MCV: 89.1 fL (ref 78.0–100.0)
MONOS PCT: 7 %
Monocytes Absolute: 0.5 10*3/uL (ref 0.1–1.0)
NEUTROS PCT: 78 %
Neutro Abs: 6.4 10*3/uL (ref 1.7–7.7)
Platelets: 282 10*3/uL (ref 150–400)
RBC: 2.57 MIL/uL — AB (ref 3.87–5.11)
RDW: 13.8 % (ref 11.5–15.5)
WBC: 8.2 10*3/uL (ref 4.0–10.5)

## 2017-09-04 LAB — URINALYSIS, ROUTINE W REFLEX MICROSCOPIC
Bilirubin Urine: NEGATIVE
GLUCOSE, UA: NEGATIVE mg/dL
HGB URINE DIPSTICK: NEGATIVE
Ketones, ur: 5 mg/dL — AB
LEUKOCYTES UA: NEGATIVE
Nitrite: NEGATIVE
PH: 6 (ref 5.0–8.0)
Protein, ur: NEGATIVE mg/dL
SPECIFIC GRAVITY, URINE: 1.008 (ref 1.005–1.030)

## 2017-09-04 LAB — ABO/RH: ABO/RH(D): O POS

## 2017-09-04 LAB — LIPASE, BLOOD: Lipase: 24 U/L (ref 11–51)

## 2017-09-04 MED ORDER — POTASSIUM CHLORIDE CRYS ER 20 MEQ PO TBCR
40.0000 meq | EXTENDED_RELEASE_TABLET | Freq: Once | ORAL | Status: AC
  Administered 2017-09-05: 40 meq via ORAL
  Filled 2017-09-04: qty 2

## 2017-09-04 MED ORDER — HYDROCODONE-ACETAMINOPHEN 5-325 MG PO TABS
1.0000 | ORAL_TABLET | Freq: Once | ORAL | Status: AC
Start: 2017-09-04 — End: 2017-09-04
  Administered 2017-09-04: 1 via ORAL
  Filled 2017-09-04: qty 1

## 2017-09-04 MED ORDER — SODIUM CHLORIDE 0.9 % IV BOLUS (SEPSIS)
1000.0000 mL | Freq: Once | INTRAVENOUS | Status: AC
  Administered 2017-09-04: 1000 mL via INTRAVENOUS

## 2017-09-04 NOTE — ED Notes (Signed)
Pt took a few steps with walker and got very nauseated while doing so. In pain while getting out of bed and needing at least 1 assist to get up.

## 2017-09-04 NOTE — ED Triage Notes (Signed)
Per [facility] pt coming from home states had surgery on Tuesday for right hip. Was told after procedure that she lost "a lot of blood." patient states she did not want to stay so went home. Reports not feeling well since surgery. Nausea and vomiting onset of yesterday. Pt given 4mg  zofran en route and 500CC fluid en route.

## 2017-09-04 NOTE — ED Notes (Signed)
Bed: WA21 Expected date:  Expected time:  Means of arrival:  Comments: EMS-dehydration

## 2017-09-04 NOTE — ED Provider Notes (Signed)
[facility] DEPT Provider Note   CSN: [id number] Arrival date & time: 09/04/17  1837     History   Chief Complaint Chief Complaint  Patient presents with  . Nausea  . Emesis    HPI  Barbara Soto is a 77 y.o. Female with a history of hypertension and degenerative joint disease, status post right hip replacement last Tuesday, who presents to the ED for evaluation of nausea, vomiting, generalized fatigue and weakness.  Patient reports she was told after the surgery that she lost a lot of blood and they were hoping to do blood transfusions before she was discharged but she left the hospital on Friday.  Patient and her daughter who is been her main caregiver after surgery, report since the surgery she has been having persistent nausea vomiting has not been able to keep down food or liquids, patient reports any time she eats she immediately feels nauseous and vomits, patient denies hematemesis.  Reports she just got a prescription for antiemetics today, has not taken any at home.  She did receive 1 dose of Zofran in transit with EMS has not had any emesis since, but still reports some feelings of nausea.  Patient denies any associated abdominal pain or discomfort, no diarrhea or blood in the stool, warts to normal bowel movements today, did initially have some constipation after surgery.  Patient denies fevers or chills, no dysuria.  Patient denies redness or warmth around the surgical site, has not change the dressing, was instructed to leave it on until she seen by her orthopedist on 2/11.  Patient has been managing her pain at home with Norco approximately every 8 hours with decent pain management.  Patient reports she has been taking this medication as long before her surgery does not think that is what is causing her nausea.  Patient also endorses some intermittent dizziness, primarily with position changes, she describes dizziness as room spinning.  Patient denies  any chest pain or shortness of breath, no lower extremity swelling.      Past Medical History:  Diagnosis Date  . Complication of anesthesia   . DJD (degenerative joint disease)    right hip  . Hypertension   . PONV (postoperative nausea and vomiting)   . Wears dentures   . Wears glasses     Patient Active Problem List   Diagnosis Date Noted  . Acute blood loss as cause of postoperative anemia 09/01/2017  . Primary osteoarthritis of right hip 08/29/2017  . HYPERTENSION 07/22/2009  . GALLBLADDER DISEASE 07/22/2009  . ARTHRITIS 07/22/2009    Past Surgical History:  Procedure Laterality Date  . ABDOMINAL HYSTERECTOMY    . APPENDECTOMY    . CATARACT EXTRACTION W/ INTRAOCULAR LENS  IMPLANT, BILATERAL    . CHOLECYSTECTOMY    . DILATION AND CURETTAGE OF UTERUS    . MULTIPLE TOOTH EXTRACTIONS    . TOTAL HIP ARTHROPLASTY Right 08/29/2017   Procedure: TOTAL HIP ARTHROPLASTY ANTERIOR APPROACH;  Surgeon: Melrose Nakayama, MD;  Location: [facility];  Service: Orthopedics;  Laterality: Right;    OB History    No data available       Home Medications    Prior to Admission medications   Medication Sig Start Date End Date Taking? Authorizing Provider  aspirin EC 325 MG EC tablet Take 1 tablet (325 mg total) by mouth 2 (two) times daily after a meal. 09/01/17  Yes Nida, Andrew, PA-C  bisacodyl (DULCOLAX) 5 MG EC tablet Take 1  tablet (5 mg total) by mouth daily as needed for moderate constipation. 09/01/17  Yes Loni Dolly, PA-C  docusate sodium (COLACE) 100 MG capsule Take 1 capsule (100 mg total) by mouth 2 (two) times daily. 09/01/17  Yes Loni Dolly, PA-C  HYDROcodone-acetaminophen (NORCO/VICODIN) 5-325 MG tablet Take 1-2 tablets by mouth every 4 (four) hours as needed. 09/01/17  Yes Loni Dolly, PA-C  IRON PO Take 1 tablet by mouth daily.   Yes [provider]  lisinopril (PRINIVIL,ZESTRIL) 10 MG tablet Take 5 mg by mouth daily.   Yes [provider]  promethazine  (PHENERGAN) 12.5 MG tablet Take 12.5-25 mg by mouth every 6 (six) hours as needed for nausea or vomiting.   Yes [provider]  tiZANidine (ZANAFLEX) 4 MG tablet Take 1 tablet (4 mg total) by mouth every 6 (six) hours as needed for muscle spasms. 09/01/17 09/01/18 Yes Loni Dolly, PA-C    Family History Family History  Problem Relation Age of Onset  . Diabetes Mother     Social History Social History   Tobacco Use  . Smoking status: Never Smoker  . Smokeless tobacco: Never Used  Substance Use Topics  . Alcohol use: No  . Drug use: No     Allergies   Adhesive [tape] and Penicillins   Review of Systems Review of Systems  Constitutional: Positive for fatigue. Negative for chills and fever.  HENT: Negative for congestion, rhinorrhea and sore throat.   Eyes: Negative for photophobia and visual disturbance.  Respiratory: Negative for cough, chest tightness and shortness of breath.   Cardiovascular: Negative for chest pain, palpitations and leg swelling.  Gastrointestinal: Positive for nausea and vomiting. Negative for abdominal pain, blood in stool and diarrhea.  Genitourinary: Negative for dysuria and frequency.  Musculoskeletal: Positive for arthralgias (R hip pain, post-op).  Skin: Positive for wound (POst-op surgical wound on right hip). Negative for color change, pallor and rash.  Neurological: Positive for dizziness and weakness. Negative for light-headedness, numbness and headaches.     Physical Exam Updated Vital Signs BP 121/71   Pulse (!) 115   Temp 98.3 F (36.8 C) (Oral)   Resp 19   Ht 5\' 2"  (1.575 m)   Wt 88.5 kg (195 lb)   SpO2 96%   BMI 35.67 kg/m   Physical Exam  Constitutional: She is oriented to person, place, and time. She appears well-developed and well-nourished. No distress.  HENT:  Head: Normocephalic and atraumatic.  Mouth/Throat: Oropharynx is clear and moist.  Eyes: EOM are normal. Pupils are equal, round, and reactive to light.  Right eye exhibits no discharge. Left eye exhibits no discharge.  No nystagmus  Neck: Neck supple.  Cardiovascular: Normal rate, regular rhythm, normal heart sounds and intact distal pulses.  Pulses:      Radial pulses are 2+ on the right side, and 2+ on the left side.       Dorsalis pedis pulses are 2+ on the right side, and 2+ on the left side.       Posterior tibial pulses are 2+ on the right side, and 2+ on the left side.  Pulmonary/Chest: Effort normal and breath sounds normal. No stridor. No respiratory distress. She has no wheezes. She has no rales.  Respirations equal and unlabored, lungs clear to auscultation  Abdominal: Soft. Bowel sounds are normal. She exhibits no distension and no mass. There is no tenderness. There is no guarding.  Musculoskeletal: She exhibits no edema or deformity.  Neurological: She is  alert and oriented to person, place, and time. Coordination normal.  Speech is clear, able to follow commands CN III-XII intact Normal strength in upper and lower extremities bilaterally including dorsiflexion and plantar flexion, strong and equal grip strength Sensation normal to light and sharp touch Moves extremities without ataxia, coordination intact  Skin: Skin is warm and dry. Capillary refill takes less than 2 seconds. She is not diaphoretic.  Psychiatric: She has a normal mood and affect. Her behavior is normal.  Nursing note and vitals reviewed.    ED Treatments / Results  Labs (all labs ordered are listed, but only abnormal results are displayed) Labs Reviewed  CBC WITH DIFFERENTIAL/PLATELET - Abnormal; Notable for the following components:      Result Value   RBC 2.57 (*)    Hemoglobin 7.9 (*)    HCT 22.9 (*)    All other components within normal limits  COMPREHENSIVE METABOLIC PANEL - Abnormal; Notable for the following components:   Potassium 3.1 (*)    Glucose, Bld 117 (*)    Calcium 8.0 (*)    Total Protein 5.9 (*)    Albumin 2.8 (*)    All other  components within normal limits  URINALYSIS, ROUTINE W REFLEX MICROSCOPIC - Abnormal; Notable for the following components:   Ketones, ur 5 (*)    All other components within normal limits  LIPASE, BLOOD  TYPE AND SCREEN  ABO/RH    EKG  EKG Interpretation None       Radiology No results found.  Procedures Procedures (including critical care time)  Medications Ordered in ED Medications  potassium chloride SA (K-DUR,KLOR-CON) CR tablet 40 mEq (not administered)  sodium chloride 0.9 % bolus 1,000 mL (0 mLs Intravenous Stopped 09/04/17 2050)  HYDROcodone-acetaminophen (NORCO/VICODIN) 5-325 MG per tablet 1 tablet (1 tablet Oral Given 09/04/17 2051)     Initial Impression / Assessment and Plan / ED Course  I have reviewed the triage vital signs and the nursing notes.  Pertinent labs & imaging results that were available during my care of the patient were reviewed by me and considered in my medical decision making (see chart for details).  Patient presents to the ED for evaluation of nausea, vomiting, generalized weakness and fatigue, she is postop day 6 after a right hip replacement.  No fevers or chills, no erythema, warmth or drainage from her incision site.  Patient been unable to tolerate p.o., reports dizziness when trying to get up and move around.  No chest pain or shortness of breath.  No associated abdominal pain.  On initial exam patient is mildly hypertensive, but vitals otherwise normal.  On exam lungs are clear to auscultation, abdomen benign, surgical site without signs of infection.  Labs show a hemoglobin of 7.9, improved from discharge last Friday hemoglobin was 6.4, mild hypokalemia at 3.1, will replace with oral potassium, otherwise no electrolyte derangements, kidney and liver function at baseline, no evidence of UTI.  Lipase normal.   Hemoglobin is at borderline, hold off on transfusion at this time, labs not showing signs of severe hydration that suggests blood is  concentrated hemoglobin is actually lower.  Patient continued to plate of nausea, do not think this is related to pain meds as patient has been on this med much longer than just after postop, patient also complaining of dizziness, she has positive orthostatics and dizziness with ambulation, but seems somewhat vertiginous in nature.  Given all of this going on postop feel that patient needs to come  in for continued IV hydration, management of refractory nausea and vomiting and dizziness.  Consult placed to [facility] hospitalist for admission for orthostatic hypotension, postop anemia, dizziness and persistent nausea vomiting.  Spoke with Dr. Alcario Drought, who will see and admit the patient.  Patient discussed with Dr. Alvino Chapel who saw and evaluated the patient as well and is in agreement with plan  Final Clinical Impressions(s) / ED Diagnoses   Final diagnoses:  Orthostatic hypotension  Intractable vomiting with nausea, unspecified vomiting type  Postoperative anemia    ED Discharge Orders    None       Janet Berlin 09/04/17 2359    Davonna Belling, MD 09/08/17 [phone]

## 2017-09-05 ENCOUNTER — Observation Stay (HOSPITAL_COMMUNITY): Payer: PPO

## 2017-09-05 DIAGNOSIS — I10 Essential (primary) hypertension: Secondary | ICD-10-CM | POA: Diagnosis present

## 2017-09-05 DIAGNOSIS — Z79899 Other long term (current) drug therapy: Secondary | ICD-10-CM | POA: Diagnosis not present

## 2017-09-05 DIAGNOSIS — I951 Orthostatic hypotension: Secondary | ICD-10-CM | POA: Diagnosis present

## 2017-09-05 DIAGNOSIS — Z91048 Other nonmedicinal substance allergy status: Secondary | ICD-10-CM | POA: Diagnosis not present

## 2017-09-05 DIAGNOSIS — E876 Hypokalemia: Secondary | ICD-10-CM | POA: Diagnosis present

## 2017-09-05 DIAGNOSIS — Z7982 Long term (current) use of aspirin: Secondary | ICD-10-CM | POA: Diagnosis not present

## 2017-09-05 DIAGNOSIS — Z880 Allergy status to penicillin: Secondary | ICD-10-CM | POA: Diagnosis not present

## 2017-09-05 DIAGNOSIS — D62 Acute posthemorrhagic anemia: Secondary | ICD-10-CM | POA: Diagnosis present

## 2017-09-05 DIAGNOSIS — D649 Anemia, unspecified: Secondary | ICD-10-CM | POA: Diagnosis present

## 2017-09-05 DIAGNOSIS — R112 Nausea with vomiting, unspecified: Secondary | ICD-10-CM | POA: Diagnosis present

## 2017-09-05 DIAGNOSIS — E860 Dehydration: Secondary | ICD-10-CM | POA: Diagnosis present

## 2017-09-05 DIAGNOSIS — Z96641 Presence of right artificial hip joint: Secondary | ICD-10-CM | POA: Diagnosis present

## 2017-09-05 LAB — BASIC METABOLIC PANEL
Anion gap: 6 (ref 5–15)
BUN: 13 mg/dL (ref 6–20)
CALCIUM: 7.5 mg/dL — AB (ref 8.9–10.3)
CO2: 29 mmol/L (ref 22–32)
CREATININE: 0.67 mg/dL (ref 0.44–1.00)
Chloride: 105 mmol/L (ref 101–111)
GFR calc Af Amer: >60 mL/min (ref >60)
GFR calc non Af Amer: >60 mL/min (ref >60)
GLUCOSE: 104 mg/dL — AB (ref 65–99)
Potassium: 3.1 mmol/L — ABNORMAL LOW (ref 3.5–5.1)
Sodium: 140 mmol/L (ref 135–145)

## 2017-09-05 LAB — CBC
HEMATOCRIT: 20.6 % — AB (ref 36.0–46.0)
Hemoglobin: 7 g/dL — ABNORMAL LOW (ref 12.0–15.0)
MCH: 30.8 pg (ref 26.0–34.0)
MCHC: 34 g/dL (ref 30.0–36.0)
MCV: 90.7 fL (ref 78.0–100.0)
PLATELETS: 236 10*3/uL (ref 150–400)
RBC: 2.27 MIL/uL — ABNORMAL LOW (ref 3.87–5.11)
RDW: 13.9 % (ref 11.5–15.5)
WBC: 6.8 10*3/uL (ref 4.0–10.5)

## 2017-09-05 LAB — PREPARE RBC (CROSSMATCH)

## 2017-09-05 MED ORDER — POTASSIUM CHLORIDE CRYS ER 20 MEQ PO TBCR
40.0000 meq | EXTENDED_RELEASE_TABLET | Freq: Once | ORAL | Status: AC
  Administered 2017-09-05: 40 meq via ORAL
  Filled 2017-09-05: qty 2

## 2017-09-05 MED ORDER — FUROSEMIDE 10 MG/ML IJ SOLN
40.0000 mg | Freq: Once | INTRAMUSCULAR | Status: AC
  Administered 2017-09-05: 40 mg via INTRAVENOUS
  Filled 2017-09-05: qty 4

## 2017-09-05 MED ORDER — SODIUM CHLORIDE 0.9 % IV SOLN
INTRAVENOUS | Status: DC
  Administered 2017-09-05 – 2017-09-06 (×3): via INTRAVENOUS

## 2017-09-05 MED ORDER — FERROUS SULFATE 325 (65 FE) MG PO TABS
325.0000 mg | ORAL_TABLET | Freq: Every day | ORAL | Status: DC
  Administered 2017-09-05 – 2017-09-06 (×2): 325 mg via ORAL
  Filled 2017-09-05 (×3): qty 1

## 2017-09-05 MED ORDER — ACETAMINOPHEN 650 MG RE SUPP: 650.0000 mg | Freq: Four times a day (QID) | RECTAL | Status: DC | PRN

## 2017-09-05 MED ORDER — HYDROCODONE-ACETAMINOPHEN 5-325 MG PO TABS
1.0000 | ORAL_TABLET | ORAL | Status: DC | PRN
  Administered 2017-09-05 – 2017-09-06 (×8): 1 via ORAL
  Filled 2017-09-05: qty 1
  Filled 2017-09-05: qty 2
  Filled 2017-09-05 (×7): qty 1

## 2017-09-05 MED ORDER — ACETAMINOPHEN 325 MG PO TABS: 650.0000 mg | ORAL_TABLET | Freq: Four times a day (QID) | ORAL | Status: DC | PRN

## 2017-09-05 MED ORDER — ASPIRIN EC 325 MG PO TBEC: 325.0000 mg | DELAYED_RELEASE_TABLET | Freq: Two times a day (BID) | ORAL | Status: DC

## 2017-09-05 MED ORDER — DOCUSATE SODIUM 100 MG PO CAPS
100.0000 mg | ORAL_CAPSULE | Freq: Two times a day (BID) | ORAL | Status: DC
  Administered 2017-09-05 – 2017-09-06 (×3): 100 mg via ORAL
  Filled 2017-09-05 (×3): qty 1

## 2017-09-05 MED ORDER — SODIUM CHLORIDE 0.9 % IV SOLN
Freq: Once | INTRAVENOUS | Status: AC
  Administered 2017-09-05: 500 mL via INTRAVENOUS

## 2017-09-05 MED ORDER — TIZANIDINE HCL 4 MG PO TABS
4.0000 mg | ORAL_TABLET | Freq: Four times a day (QID) | ORAL | Status: DC | PRN
  Administered 2017-09-06: 4 mg via ORAL
  Filled 2017-09-05: qty 1

## 2017-09-05 MED ORDER — LISINOPRIL 5 MG PO TABS
5.0000 mg | ORAL_TABLET | Freq: Every day | ORAL | Status: DC
  Administered 2017-09-05: 5 mg via ORAL
  Filled 2017-09-05: qty 1

## 2017-09-05 MED ORDER — ONDANSETRON HCL 4 MG/2ML IJ SOLN: 4.0000 mg | Freq: Four times a day (QID) | INTRAMUSCULAR | Status: DC | PRN

## 2017-09-05 MED ORDER — BISACODYL 5 MG PO TBEC: 5.0000 mg | DELAYED_RELEASE_TABLET | Freq: Every day | ORAL | Status: DC | PRN

## 2017-09-05 MED ORDER — ASPIRIN EC 81 MG PO TBEC
81.0000 mg | DELAYED_RELEASE_TABLET | Freq: Every day | ORAL | Status: DC
  Administered 2017-09-05 – 2017-09-06 (×2): 81 mg via ORAL
  Filled 2017-09-05 (×2): qty 1

## 2017-09-05 MED ORDER — PROMETHAZINE HCL 25 MG PO TABS: 12.5000 mg | ORAL_TABLET | Freq: Four times a day (QID) | ORAL | Status: DC | PRN

## 2017-09-05 MED ORDER — ONDANSETRON HCL 4 MG PO TABS
4.0000 mg | ORAL_TABLET | Freq: Four times a day (QID) | ORAL | Status: DC | PRN
  Administered 2017-09-06: 4 mg via ORAL
  Filled 2017-09-05: qty 1

## 2017-09-05 MED ORDER — ENOXAPARIN SODIUM 40 MG/0.4ML ~~LOC~~ SOLN
40.0000 mg | SUBCUTANEOUS | Status: DC
  Administered 2017-09-05 – 2017-09-06 (×2): 40 mg via SUBCUTANEOUS
  Filled 2017-09-05 (×2): qty 0.4

## 2017-09-05 NOTE — ED Notes (Signed)
Attempted to call report, no answer. Will reattempt.

## 2017-09-05 NOTE — ED Notes (Signed)
Kass[place]. RACHAL C NT ASSISTED

## 2017-09-05 NOTE — H&P (Signed)
History and Physical    Barbara Soto:[id number] DOB: March 23, 1941 DOA: 09/04/2017  PCP: Patient, No Pcp Per  Patient coming from: Home  I have personally briefly reviewed patient's old medical records in [place]  Chief Complaint: N/V  HPI: Barbara Soto is a 77 y.o. female with medical history significant of R THA last Tues complicated by intra op blood loss, and low HGB of 6.4 on 2/1.  Blood transfusion was recommended but patient declined and was discharged on 2/1.  Since getting home she has had persistent N/V, inability to keep anything down.  No hematemesis.  Just got script for anti-emetics today but hasnt taken any.  Brought in to ED for persistent symptoms.   ED Course: 1 dose of zofran with EMS, no further vomiting, seems to be keeping some fluids down.  Got 1L NS bolus.  HGB 7.9 (6.4 on DC).   Review of Systems: As per HPI otherwise 10 point review of systems negative.   Past Medical History:  Diagnosis Date  . Complication of anesthesia   . DJD (degenerative joint disease)    right hip  . Hypertension   . PONV (postoperative nausea and vomiting)   . Wears dentures   . Wears glasses     Past Surgical History:  Procedure Laterality Date  . ABDOMINAL HYSTERECTOMY    . APPENDECTOMY    . CATARACT EXTRACTION W/ INTRAOCULAR LENS  IMPLANT, BILATERAL    . CHOLECYSTECTOMY    . DILATION AND CURETTAGE OF UTERUS    . MULTIPLE TOOTH EXTRACTIONS    . TOTAL HIP ARTHROPLASTY Right 08/29/2017   Procedure: TOTAL HIP ARTHROPLASTY ANTERIOR APPROACH;  Surgeon: Melrose Nakayama, MD;  Location: [facility];  Service: Orthopedics;  Laterality: Right;     reports that  has never smoked. she has never used smokeless tobacco. She reports that she does not drink alcohol or use drugs.  Allergies  Allergen Reactions  . Adhesive [Tape] Other (See Comments)    Takes skin off   . Penicillins Rash and Other (See Comments)    PATIENT HAS HAD A PCN REACTION WITH IMMEDIATE RASH,  FACIAL/TONGUE/THROAT SWELLING, SOB, OR LIGHTHEADEDNESS WITH HYPOTENSION:  #  #  #  YES  #  #  #   Has patient had a PCN reaction causing severe rash involving mucus membranes or skin necrosis: No Has patient had a PCN reaction that required hospitalization: No Has patient had a PCN reaction occurring within the last 10 years: No     Family History  Problem Relation Age of Onset  . Diabetes Mother      Prior to Admission medications   Medication Sig Start Date End Date Taking? Authorizing Provider  aspirin EC 325 MG EC tablet Take 1 tablet (325 mg total) by mouth 2 (two) times daily after a meal. 09/01/17  Yes Nida, Mitzi Hansen, PA-C  bisacodyl (DULCOLAX) 5 MG EC tablet Take 1 tablet (5 mg total) by mouth daily as needed for moderate constipation. 09/01/17  Yes Loni Dolly, PA-C  docusate sodium (COLACE) 100 MG capsule Take 1 capsule (100 mg total) by mouth 2 (two) times daily. 09/01/17  Yes Loni Dolly, PA-C  HYDROcodone-acetaminophen (NORCO/VICODIN) 5-325 MG tablet Take 1-2 tablets by mouth every 4 (four) hours as needed. 09/01/17  Yes Loni Dolly, PA-C  IRON PO Take 1 tablet by mouth daily.   Yes [provider]  lisinopril (PRINIVIL,ZESTRIL) 10 MG tablet Take 5 mg by mouth daily.   Yes [provider]  promethazine (PHENERGAN) 12.5 MG tablet Take 12.5-25 mg by mouth every 6 (six) hours as needed for nausea or vomiting.   Yes [provider]  tiZANidine (ZANAFLEX) 4 MG tablet Take 1 tablet (4 mg total) by mouth every 6 (six) hours as needed for muscle spasms. 09/01/17 09/01/18 Yes Loni Dolly, PA-C    Physical Exam: Vitals:   09/05/17 0000 09/05/17 0030 09/05/17 0100 09/05/17 0200  BP: (!) 159/73 (!) 165/70 (!) 145/74 (!) 146/63  Pulse: 81 79 89 80  Resp: 16 11 18 16   Temp:      TempSrc:      SpO2: 95% 98% 95% 97%  Weight:      Height:        Constitutional: NAD, calm, comfortable Eyes: PERRL, lids and conjunctivae normal ENMT: Mucous membranes are moist.  Posterior pharynx clear of any exudate or lesions.Normal dentition.  Neck: normal, supple, no masses, no thyromegaly Respiratory: clear to auscultation bilaterally, no wheezing, no crackles. Normal respiratory effort. No accessory muscle use.  Cardiovascular: Regular rate and rhythm, no murmurs / rubs / gallops. No extremity edema. 2+ pedal pulses. No carotid bruits.  Abdomen: no tenderness, no masses palpated. No hepatosplenomegaly. Bowel sounds positive.  Musculoskeletal: no clubbing / cyanosis. No joint deformity upper and lower extremities. Good ROM, no contractures. Normal muscle tone.  Skin: Post op wound w/o signs of infection Neurologic: CN 2-12 grossly intact. Sensation intact, DTR normal. Strength 5/5 in all 4.  Psychiatric: Normal judgment and insight. Alert and oriented x 3. Normal mood.    Labs on Admission: I have personally reviewed following labs and imaging studies  CBC: Recent Labs  Lab 08/29/17 1440 08/30/17 0648 08/31/17 0800 09/01/17 0610 09/04/17 1926  WBC  --  13.5* 10.5 7.5 8.2  NEUTROABS  --   --   --   --  6.4  HGB 10.2* 9.1* 7.2* 6.4* 7.9*  HCT 30.0* 26.4* 21.8* 19.3* 22.9*  MCV  --  91.3 90.8 91.0 89.1  PLT  --  236 172 130* 503   Basic Metabolic Panel: Recent Labs  Lab 08/29/17 1440 08/30/17 0648 09/04/17 1926  NA 142 139 139  K 4.4 4.0 3.1*  CL  --  106 101  CO2  --  24 26  GLUCOSE 111* 171* 117*  BUN  --  17 15  CREATININE  --  0.96 0.71  CALCIUM  --  8.4* 8.0*   GFR: Estimated Creatinine Clearance: 62.8 mL/min (by C-G formula based on SCr of 0.71 mg/dL). Liver Function Tests: Recent Labs  Lab 09/04/17 1926  AST 37  ALT 22  ALKPHOS 64  BILITOT 1.0  PROT 5.9*  ALBUMIN 2.8*   Recent Labs  Lab 09/04/17 1926  LIPASE 24   No results for input(s): AMMONIA in the last 168 hours. Coagulation Profile: No results for input(s): INR, PROTIME in the last 168 hours. Cardiac Enzymes: No results for input(s): CKTOTAL, CKMB, CKMBINDEX,  TROPONINI in the last 168 hours. BNP (last 3 results) No results for input(s): PROBNP in the last 8760 hours. HbA1C: No results for input(s): HGBA1C in the last 72 hours. CBG: No results for input(s): GLUCAP in the last 168 hours. Lipid Profile: No results for input(s): CHOL, HDL, LDLCALC, TRIG, CHOLHDL, LDLDIRECT in the last 72 hours. Thyroid Function Tests: No results for input(s): TSH, T4TOTAL, FREET4, T3FREE, THYROIDAB in the last 72 hours. Anemia Panel: No results for input(s): VITAMINB12, FOLATE, FERRITIN, TIBC, IRON, RETICCTPCT in the last  72 hours. Urine analysis:    Component Value Date/Time   COLORURINE YELLOW 09/04/2017 2143   APPEARANCEUR CLEAR 09/04/2017 2143   LABSPEC 1.008 09/04/2017 2143   PHURINE 6.0 09/04/2017 2143   GLUCOSEU NEGATIVE 09/04/2017 2143   HGBUR NEGATIVE 09/04/2017 2143   BILIRUBINUR NEGATIVE 09/04/2017 2143   KETONESUR 5 (A) 09/04/2017 2143   PROTEINUR NEGATIVE 09/04/2017 2143   NITRITE NEGATIVE 09/04/2017 2143   LEUKOCYTESUR NEGATIVE 09/04/2017 2143    Radiological Exams on Admission: No results found.  EKG: Independently reviewed.  Assessment/Plan Principal Problem:   Nausea and vomiting Active Problems:   Essential hypertension   Acute blood loss as cause of postoperative anemia    1. N/V- 1. Zofran PRN 2. IVF: 1L bolus and 100 cc/hr 3. Benign abdomen on exam, and N/V seems resolved post zofran. 4. If N/V resumes however, may wish to get imaging 2. Post op blood loss anemia - 1. Improved vs dehydrated from N/V 2. Repeat CBC in AM 3. Transfuse if HGB < 7 3. HTN - continue lisinopril 4. Hypokalemia - 1. Replace PO  DVT prophylaxis: Lovenox - now several days post-op so should be safe to start this. Code Status: Full Family Communication: Family at bedside Disposition Plan: Home after admit Consults called: None Admission status: Place in [place], [place] Hospitalists Pager [phone]  If 7AM-7PM,  please contact day team taking care of patient [email or web address] Password [email or web address]  09/05/2017, 2:25 AM

## 2017-09-05 NOTE — ED Notes (Signed)
Pt moved from ED stretcher to hospital bed.  

## 2017-09-05 NOTE — ED Notes (Signed)
ED TO INPATIENT HANDOFF REPORT  Name/Age/Gender Barbara Soto 77 y.o. female  Code Status    Code Status Orders  (From admission, onward)        Start     Ordered   09/05/17 0208  Full code  Continuous     09/05/17 0208    Code Status History    Date Active Date Inactive Code Status Order ID Comments User Context   08/29/2017 20:28 09/01/2017 15:35 Full Code [id number]  Loni Dolly, PA-C Inpatient      Home/SNF/Other Home  Chief Complaint nauesa, vomiting, generalized weakness  Level of Care/Admitting Diagnosis ED Disposition    ED Disposition Condition [place]: [facility] [[id number]]  Level of Care: Telemetry [5]  Admit to tele based on following criteria: Complex arrhythmia (Bradycardia/Tachycardia)  Diagnosis: Anemia [017793]  Admitting Physician: Morrison Old  Attending Physician: Morrison Old  Estimated length of stay: 3 - 4 days  Certification:: I certify this patient will need inpatient services for at least 2 midnights  Bed request comments: tele  PT Class (Do Not Modify): Inpatient [101]  PT Acc Code (Do Not Modify): Private [1]       Medical History Past Medical History:  Diagnosis Date  . Complication of anesthesia   . DJD (degenerative joint disease)    right hip  . Hypertension   . PONV (postoperative nausea and vomiting)   . Wears dentures   . Wears glasses     Allergies Allergies  Allergen Reactions  . Adhesive [Tape] Other (See Comments)    Takes skin off   . Penicillins Rash and Other (See Comments)    PATIENT HAS HAD A PCN REACTION WITH IMMEDIATE RASH, FACIAL/TONGUE/THROAT SWELLING, SOB, OR LIGHTHEADEDNESS WITH HYPOTENSION:  #  #  #  YES  #  #  #   Has patient had a PCN reaction causing severe rash involving mucus membranes or skin necrosis: No Has patient had a PCN reaction that required hospitalization: No Has patient had a PCN reaction occurring within the last 10  years: No     IV Location/Drains/Wounds Patient Lines/Drains/Airways Status   Active Line/Drains/Airways    Name:   Placement date:   Placement time:   Site:   Days:   Peripheral IV 09/04/17 Left Antecubital   09/04/17    1849    Antecubital   1   Peripheral IV 09/04/17 Right;Posterior Forearm   09/04/17    1950    Forearm   1   Incision (Closed) 08/29/17 Hip Right   08/29/17    1526     7          Labs/Imaging Results for orders placed or performed during the hospital encounter of 09/04/17 (from the past 48 hour(s))  CBC with Differential     Status: Abnormal   Collection Time: 09/04/17  7:26 PM  Result Value Ref Range   WBC 8.2 4.0 - 10.5 K/uL   RBC 2.57 (L) 3.87 - 5.11 MIL/uL   Hemoglobin 7.9 (L) 12.0 - 15.0 g/dL   HCT 22.9 (L) 36.0 - 46.0 %   MCV 89.1 78.0 - 100.0 fL   MCH 30.7 26.0 - 34.0 pg   MCHC 34.5 30.0 - 36.0 g/dL   RDW 13.8 11.5 - 15.5 %   Platelets 282 150 - 400 K/uL   Neutrophils Relative % 78 %   Neutro Abs 6.4 1.7 - 7.7 K/uL   Lymphocytes  Relative 14 %   Lymphs Abs 1.2 0.7 - 4.0 K/uL   Monocytes Relative 7 %   Monocytes Absolute 0.5 0.1 - 1.0 K/uL   Eosinophils Relative 1 %   Eosinophils Absolute 0.1 0.0 - 0.7 K/uL   Basophils Relative 0 %   Basophils Absolute 0.0 0.0 - 0.1 K/uL    Comment: Performed at [facility], [place] [address], [place], [place] [address]  Comprehensive metabolic panel     Status: Abnormal   Collection Time: 09/04/17  7:26 PM  Result Value Ref Range   Sodium 139 135 - 145 mmol/L   Potassium 3.1 (L) 3.5 - 5.1 mmol/L   Chloride 101 101 - 111 mmol/L   CO2 26 22 - 32 mmol/L   Glucose, Bld 117 (H) 65 - 99 mg/dL   BUN 15 6 - 20 mg/dL   Creatinine, Ser 0.71 0.44 - 1.00 mg/dL   Calcium 8.0 (L) 8.9 - 10.3 mg/dL   Total Protein 5.9 (L) 6.5 - 8.1 g/dL   Albumin 2.8 (L) 3.5 - 5.0 g/dL   AST 37 15 - 41 U/L   ALT 22 14 - 54 U/L   Alkaline Phosphatase 64 38 - 126 U/L   Total Bilirubin 1.0 0.3 - 1.2 mg/dL   GFR calc non  Af Amer >60 >60 mL/min   GFR calc Af Amer >60 >60 mL/min    Comment: (NOTE) The eGFR has been calculated using the CKD EPI equation. This calculation has not been validated in all clinical situations. eGFR's persistently <60 mL/min signify possible Chronic Kidney Disease.    Anion gap 12 5 - 15    Comment: Performed at [facility], [place] [address] [place], [place] [address]  Lipase, blood     Status: None   Collection Time: 09/04/17  7:26 PM  Result Value Ref Range   Lipase 24 11 - 51 U/L    Comment: Performed at [facility], [place] [address], [place], [place] [address]  Type and screen [place]     Status: None (Preliminary result)   Collection Time: 09/04/17  7:46 PM  Result Value Ref Range   ABO/RH(D) O POS    Antibody Screen NEG    Sample Expiration 09/07/2017    Unit Number [id number]    Blood Component Type RED CELLS,LR    Unit division 00    Status of Unit ISSUED    Transfusion Status OK TO TRANSFUSE    Crossmatch Result Compatible    Unit Number [id number]    Blood Component Type RED CELLS,LR    Unit division 00    Status of Unit ISSUED    Transfusion Status OK TO TRANSFUSE    Crossmatch Result      Compatible Performed at [facility], [place] [address] [place], [place] [address]   ABO/Rh     Status: None   Collection Time: 09/04/17  7:53 PM  Result Value Ref Range   ABO/RH(D)      O POS Performed at [facility], [place] [address] [place],  [address]   Urinalysis, Routine w reflex microscopic     Status: Abnormal   Collection Time: 09/04/17  9:43 PM  Result Value Ref Range   Color, Urine YELLOW YELLOW   APPearance CLEAR CLEAR   Specific Gravity, Urine 1.008 1.005 - 1.030   pH 6.0 5.0 - 8.0   Glucose, UA NEGATIVE NEGATIVE mg/dL   Hgb  urine dipstick NEGATIVE NEGATIVE   Bilirubin Urine NEGATIVE NEGATIVE   Ketones, ur 5 (A) NEGATIVE mg/dL   Protein, ur  NEGATIVE NEGATIVE mg/dL   Nitrite NEGATIVE NEGATIVE   Leukocytes, UA NEGATIVE NEGATIVE    Comment: Performed at [facility], [place] [address], [place], [place] [address]  CBC     Status: Abnormal   Collection Time: 09/05/17  4:59 AM  Result Value Ref Range   WBC 6.8 4.0 - 10.5 K/uL   RBC 2.27 (L) 3.87 - 5.11 MIL/uL   Hemoglobin 7.0 (L) 12.0 - 15.0 g/dL   HCT 20.6 (L) 36.0 - 46.0 %   MCV 90.7 78.0 - 100.0 fL   MCH 30.8 26.0 - 34.0 pg   MCHC 34.0 30.0 - 36.0 g/dL   RDW 13.9 11.5 - 15.5 %   Platelets 236 150 - 400 K/uL    Comment: Performed at [facility], [place] [address], [place], [place] [address]  Basic metabolic panel     Status: Abnormal   Collection Time: 09/05/17  4:59 AM  Result Value Ref Range   Sodium 140 135 - 145 mmol/L   Potassium 3.1 (L) 3.5 - 5.1 mmol/L   Chloride 105 101 - 111 mmol/L   CO2 29 22 - 32 mmol/L   Glucose, Bld 104 (H) 65 - 99 mg/dL   BUN 13 6 - 20 mg/dL   Creatinine, Ser 0.67 0.44 - 1.00 mg/dL   Calcium 7.5 (L) 8.9 - 10.3 mg/dL   GFR calc non Af Amer >60 >60 mL/min   GFR calc Af Amer >60 >60 mL/min    Comment: (NOTE) The eGFR has been calculated using the CKD EPI equation. This calculation has not been validated in all clinical situations. eGFR's persistently <60 mL/min signify possible Chronic Kidney Disease.    Anion gap 6 5 - 15    Comment: Performed at [facility], [place] [address], [place], [place] [address]  Prepare RBC     Status: None   Collection Time: 09/05/17 10:49 AM  Result Value Ref Range   Order Confirmation      ORDER PROCESSED BY BLOOD BANK Performed at [place] [address], [place], [place] [address]    Dg Abd Acute W/chest  Result Date: 09/05/2017 CLINICAL DATA:  Nausea and vomiting stents recent hip surgery EXAM: DG ABDOMEN ACUTE W/ 1V CHEST COMPARISON:  08/28/2017 FINDINGS: The overall inspiratory effort is poor. Cardiac shadow is stable. No  focal infiltrate or sizable effusion is noted. Scattered large and small bowel gas is noted. No obstructive changes are seen. No free air is noted. No abnormal mass or abnormal calcifications are noted. Degenerative changes of the lumbar spine are seen. Status post right hip replacement. IMPRESSION: No acute abnormality noted. Electronically Signed   By: Inez Catalina M.D.   On: 09/05/2017 09:19    Pending Labs Unresulted Labs (From admission, onward)   Start     Ordered   09/06/17 0500  CBC  Tomorrow morning,   R    Question:  Specimen collection method  Answer:  Lab=Lab collect   09/05/17 0950   09/06/17 0211  Basic metabolic panel  Tomorrow morning,   R    Question:  Specimen collection method  Answer:  Lab=Lab collect   09/05/17 0950      Vitals/Pain Today's Vitals   09/05/17 2000 09/05/17 2030 09/05/17 2100 09/05/17 2118  BP: (!) 145/74 102/63 136/76   Pulse:  87 83 77   Resp: 15 18 11    Temp:      TempSrc:      SpO2: 98% 97% 98%   Weight:      Height:      PainSc:    Asleep    Isolation Precautions No active isolations  Medications Medications  0.9 %  sodium chloride infusion ( Intravenous Stopped 09/05/17 1059)  HYDROcodone-acetaminophen (NORCO/VICODIN) 5-325 MG per tablet 1-2 tablet (1 tablet Oral Given 09/05/17 1839)  lisinopril (PRINIVIL,ZESTRIL) tablet 5 mg (5 mg Oral Given 09/05/17 0925)  ferrous sulfate tablet 325 mg (325 mg Oral Given 09/05/17 0925)  tiZANidine (ZANAFLEX) tablet 4 mg (not administered)  promethazine (PHENERGAN) tablet 12.5-25 mg (not administered)  docusate sodium (COLACE) capsule 100 mg (100 mg Oral Given 09/05/17 0924)  bisacodyl (DULCOLAX) EC tablet 5 mg (not administered)  acetaminophen (TYLENOL) tablet 650 mg (not administered)    Or  acetaminophen (TYLENOL) suppository 650 mg (not administered)  ondansetron (ZOFRAN) tablet 4 mg (not administered)    Or  ondansetron (ZOFRAN) injection 4 mg (not administered)  enoxaparin (LOVENOX) injection 40 mg  (40 mg Subcutaneous Given 09/05/17 0927)  aspirin EC tablet 81 mg (81 mg Oral Given 09/05/17 0926)  sodium chloride 0.9 % bolus 1,000 mL (0 mLs Intravenous Stopped 09/04/17 2050)  HYDROcodone-acetaminophen (NORCO/VICODIN) 5-325 MG per tablet 1 tablet (1 tablet Oral Given 09/04/17 2051)  potassium chloride SA (K-DUR,KLOR-CON) CR tablet 40 mEq (40 mEq Oral Given 09/05/17 0001)  0.9 %  sodium chloride infusion ( Intravenous Stopped 09/05/17 2030)  furosemide (LASIX) injection 40 mg (40 mg Intravenous Given 09/05/17 1359)  potassium chloride SA (K-DUR,KLOR-CON) CR tablet 40 mEq (40 mEq Oral Given 09/05/17 1229)  potassium chloride SA (K-DUR,KLOR-CON) CR tablet 40 mEq (40 mEq Oral Given 09/05/17 1840)    Mobility walks with person assist

## 2017-09-05 NOTE — ED Notes (Signed)
REVIEWED EKG WITH ADMITTING MD COURAGE. NO NEW ORDERS GIVEN.

## 2017-09-05 NOTE — Care Management Note (Signed)
Case Management Note  Patient Details  Name: Barbara Soto MRN: [id number] Date of Birth: April 27, 1941  CM noted pt was active with [facility].  Contacted Lisa with [facility] to advised of pt's admission.  Will follow for needs.  Expected Discharge Date:    Unkown              Expected Discharge Plan:  [place] Choice:  Home Health Choice offered to:  Patient  HH Arranged:  PT [place]:  [facility] ([facility])  Status of Service:  In process, will continue to follow  Rae Mar, RN 09/05/2017, 12:05 PM

## 2017-09-05 NOTE — ED Notes (Signed)
IRREGULAR HR. 80-100. PT WITHOUT COMPLAINT. STARTING 2ND UNIT OF PRBC. EKG PERFORMED. CALL INTO ADMITTING MD

## 2017-09-05 NOTE — ED Notes (Signed)
INCORRECT DOCUMENTATION ON RR 7. RR 16

## 2017-09-05 NOTE — ED Notes (Signed)
PT AGREED TO HAVE TRANSFUSION. ADMITTING MD MADE AWARE

## 2017-09-05 NOTE — Progress Notes (Addendum)
Patient seen and evaluated, chart reviewed, please see EMR for updated orders. Please see full H&P dictated by admitting physician Dr Alcario Drought for same date of service.     1)Symptomatic anemia-patient is dizzy with orthostatic hypotension, recently refused transfusion prior to discharge home on 09/01/2017, suspect acute blood loss anemia (post op) due to recent right hip surgery, hemoglobin on 08/28/2017 was 13.6, hemoglobin on 09/05/2017 is down to 7.0, Transfuse 2 units of packed red blood cells, please transfuse each unit over 3 hours, please give Lasix 40 mg IV x1 after transfusion of first unit of packed cells. Risk, benefits and alternatives to transfusion of blood products discussed with patient and granddaughter at bedside. Indication for transfusion discussed.  Consent obtained   2)N/V/D/Hypokalemia-lipase is not elevated, abdominal x-rays pending, continue IV fluids, advance diet as tolerated, continue PRN antiemetics, replace potassium  Heart rhythm is irregular, on the monitor patient appears to have premature atrial beats, EKG with sinus rhythm with premature beats.   Patient seen and evaluated, chart reviewed, please see EMR for updated orders. Please see full H&P dictated by admitting physician Dr Alcario Drought for same date of service.

## 2017-09-05 NOTE — ED Notes (Signed)
ADMISSION MD Provider at bedside. 

## 2017-09-05 NOTE — ED Notes (Signed)
Dr. Gardner at bedside 

## 2017-09-06 DIAGNOSIS — D62 Acute posthemorrhagic anemia: Principal | ICD-10-CM

## 2017-09-06 DIAGNOSIS — R112 Nausea with vomiting, unspecified: Secondary | ICD-10-CM

## 2017-09-06 DIAGNOSIS — Z96641 Presence of right artificial hip joint: Secondary | ICD-10-CM

## 2017-09-06 LAB — BPAM RBC
BLOOD PRODUCT EXPIRATION DATE: 201903052359
BLOOD PRODUCT EXPIRATION DATE: 201903052359
ISSUE DATE / TIME: 201902051109
ISSUE DATE / TIME: 201902051442
UNIT TYPE AND RH: 5100
Unit Type and Rh: 5100

## 2017-09-06 LAB — TYPE AND SCREEN
ABO/RH(D): O POS
Antibody Screen: NEGATIVE
UNIT DIVISION: 0
Unit division: 0

## 2017-09-06 LAB — CBC
HEMATOCRIT: 28 % — AB (ref 36.0–46.0)
HEMOGLOBIN: 9.3 g/dL — AB (ref 12.0–15.0)
MCH: 29.2 pg (ref 26.0–34.0)
MCHC: 33.2 g/dL (ref 30.0–36.0)
MCV: 88.1 fL (ref 78.0–100.0)
Platelets: 241 10*3/uL (ref 150–400)
RBC: 3.18 MIL/uL — ABNORMAL LOW (ref 3.87–5.11)
RDW: 15.4 % (ref 11.5–15.5)
WBC: 7.3 10*3/uL (ref 4.0–10.5)

## 2017-09-06 LAB — BASIC METABOLIC PANEL
Anion gap: 7 (ref 5–15)
BUN: 11 mg/dL (ref 6–20)
CHLORIDE: 105 mmol/L (ref 101–111)
CO2: 27 mmol/L (ref 22–32)
Calcium: 7.9 mg/dL — ABNORMAL LOW (ref 8.9–10.3)
Creatinine, Ser: 0.66 mg/dL (ref 0.44–1.00)
GFR calc Af Amer: >60 mL/min (ref >60)
GFR calc non Af Amer: >60 mL/min (ref >60)
GLUCOSE: 102 mg/dL — AB (ref 65–99)
POTASSIUM: 3.8 mmol/L (ref 3.5–5.1)
SODIUM: 139 mmol/L (ref 135–145)

## 2017-09-06 NOTE — Discharge Summary (Signed)
Physician Discharge Summary  Barbara Soto BTD:[id number] DOB: Sep 27, 1940 DOA: 09/04/2017  PCP: Patient, No Pcp Per  Admit date: 09/04/2017 Discharge date: 09/06/2017  Recommendations for Outpatient Follow-up:  1. F/u THR with Dr. Rhona Raider 2 weeks as previously scheduled   Follow-up Information    [facility] up.   Specialty:  Home Health Services Why:  PT Contact information: [address] [place] [address] [phone]        Melrose Nakayama, MD. Schedule an appointment as soon as possible for a visit in 2 week(s).   Specialty:  Orthopedic Surgery Contact information: [place] [address] [phone]            Discharge Diagnoses:  1. Symptomatic ABLA peri-operative in nature 2. Nausea/vomiting 3. Dehydration 4. S/p right [facility] 1/29  Discharge Condition: improved Disposition: home with HHPT  Diet recommendation: heart healthy  Filed Weights   09/04/17 1951 09/05/17 2218  Weight: 88.5 kg (195 lb) 93.8 kg (206 lb 11.2 oz)    History of present illness:  76yow s/p right THR 1/29 for OA right hip, complicated by ABLA , PRBC recommended, but pt refused transfusion, was d/c home 2/1 with Hgb 6.4, on ASA BID, who PRESENTED with n/v, fatigue, weakness, dizziness. Admitted for n/v, post-op ABLA.  Hospital Course:  Patient was treated with IVF and transfused 2 units PRBC with appropriate response with Hgb. Dehydration resolved with IVF. Hgb on admission without sig change since discharge and no evidence to suggest ongoing blood loss. With appropriate rise in Hgb, now stable for discharge. Given Hgb and admission without sig change previous discharge and periop nature of blood loss there is no apparent reason not to resume ASA. N/V resolved with treatment of anemia and orthostasis. AAS was negative and exam benign, no evidence to suggest acute intraabdominal process.  Consultants:  Patient reports orthopedic team saw patient 2/5  in hospital and is aware of admission  Procedures:  2/5 two units PRBC  Today's assessment: S: feels much better. No n/v. No CP, SOB or dizziness. O: Vitals:  Vitals:   09/05/17 2218 09/06/17 0546  BP: (!) 151/77 138/68  Pulse: 87 80  Resp: 16 16  Temp: 98 F (36.7 C) 98.3 F (36.8 C)  SpO2: 100% 98%    Constitutional:  . Appears calm and comfortable Respiratory:  . CTA bilaterally, no w/r/r.  . Respiratory effort normal.  Cardiovascular:  . RRR, no m/r/g . RLE 2+ edema  . Right DP pulse 2+  Abdomen:  . Soft, ntnd Psychiatric:  . judgement and insight appear normal . Mental status o Mood, affect appropriate   Labs:  Hgb 7.0 >> 9.3  BMP unremarkable  Imaging studies:  AAS negative    Discharge Instructions  Discharge Instructions    Diet - low sodium heart healthy   Complete by:  As directed    Discharge instructions   Complete by:  As directed    Call your physician or seek immediate medical attention for dizziness, lightheadedness, falls, weakness, bleeding, swelling, wound pain, bleeding, drainage or worsening of condition. Activity as per orthopedics.     Allergies as of 09/06/2017      Reactions   Adhesive [tape] Other (See Comments)   Takes skin off    Penicillins Rash, Other (See Comments)   PATIENT HAS HAD A PCN REACTION WITH IMMEDIATE RASH, FACIAL/TONGUE/THROAT SWELLING, SOB, OR LIGHTHEADEDNESS WITH HYPOTENSION:  #  #  #  YES  #  #  #  Has patient had a PCN reaction causing severe rash involving mucus membranes or skin necrosis: No Has patient had a PCN reaction that required hospitalization: No Has patient had a PCN reaction occurring within the last 10 years: No      Medication List    TAKE these medications   aspirin 325 MG EC tablet Take 1 tablet (325 mg total) by mouth 2 (two) times daily after a meal.   bisacodyl 5 MG EC tablet Commonly known as:  DULCOLAX Take 1 tablet (5 mg total) by mouth daily as needed for moderate  constipation.   docusate sodium 100 MG capsule Commonly known as:  COLACE Take 1 capsule (100 mg total) by mouth 2 (two) times daily.   HYDROcodone-acetaminophen 5-325 MG tablet Commonly known as:  NORCO/VICODIN Take 1-2 tablets by mouth every 4 (four) hours as needed.   IRON PO Take 1 tablet by mouth daily.   lisinopril 10 MG tablet Commonly known as:  PRINIVIL,ZESTRIL Take 5 mg by mouth daily.   promethazine 12.5 MG tablet Commonly known as:  PHENERGAN Take 12.5-25 mg by mouth every 6 (six) hours as needed for nausea or vomiting.   tiZANidine 4 MG tablet Commonly known as:  ZANAFLEX Take 1 tablet (4 mg total) by mouth every 6 (six) hours as needed for muscle spasms.      Allergies  Allergen Reactions  . Adhesive [Tape] Other (See Comments)    Takes skin off   . Penicillins Rash and Other (See Comments)    PATIENT HAS HAD A PCN REACTION WITH IMMEDIATE RASH, FACIAL/TONGUE/THROAT SWELLING, SOB, OR LIGHTHEADEDNESS WITH HYPOTENSION:  #  #  #  YES  #  #  #   Has patient had a PCN reaction causing severe rash involving mucus membranes or skin necrosis: No Has patient had a PCN reaction that required hospitalization: No Has patient had a PCN reaction occurring within the last 10 years: No     The results of significant diagnostics from this hospitalization (including imaging, microbiology, ancillary and laboratory) are listed below for reference.    Significant Diagnostic Studies: Dg Abd Acute W/chest  Result Date: 09/05/2017 CLINICAL DATA:  Nausea and vomiting stents recent hip surgery EXAM: DG ABDOMEN ACUTE W/ 1V CHEST COMPARISON:  08/28/2017 FINDINGS: The overall inspiratory effort is poor. Cardiac shadow is stable. No focal infiltrate or sizable effusion is noted. Scattered large and small bowel gas is noted. No obstructive changes are seen. No free air is noted. No abnormal mass or abnormal calcifications are noted. Degenerative changes of the lumbar spine are seen. Status  post right hip replacement. IMPRESSION: No acute abnormality noted. Electronically Signed   By: Inez Catalina M.D.   On: 09/05/2017 09:19   Microbiology: Recent Results (from the past 240 hour(s))  Surgical pcr screen     Status: Abnormal   Collection Time: 08/28/17 10:28 AM  Result Value Ref Range Status   MRSA, PCR NEGATIVE NEGATIVE Final   Staphylococcus aureus POSITIVE (A) NEGATIVE Final    Comment: (NOTE) The Xpert SA Assay (FDA approved for NASAL specimens in patients 73 years of age and older), is one component of a comprehensive surveillance program. It is not intended to diagnose infection nor to guide or monitor treatment.      Labs: Basic Metabolic Panel: Recent Labs  Lab 09/04/17 1926 09/05/17 0459 09/06/17 0439  NA 139 140 139  K 3.1* 3.1* 3.8  CL 101 105 105  CO2 26 29 27   GLUCOSE  117* 104* 102*  BUN 15 13 11   CREATININE 0.71 0.67 0.66  CALCIUM 8.0* 7.5* 7.9*   Liver Function Tests: Recent Labs  Lab 09/04/17 1926  AST 37  ALT 22  ALKPHOS 64  BILITOT 1.0  PROT 5.9*  ALBUMIN 2.8*   Recent Labs  Lab 09/04/17 1926  LIPASE 24   CBC: Recent Labs  Lab 08/31/17 0800 09/01/17 0610 09/04/17 1926 09/05/17 0459 09/06/17 0439  WBC 10.5 7.5 8.2 6.8 7.3  NEUTROABS  --   --  6.4  --   --   HGB 7.2* 6.4* 7.9* 7.0* 9.3*  HCT 21.8* 19.3* 22.9* 20.6* 28.0*  MCV 90.8 91.0 89.1 90.7 88.1  PLT 172 130* 282 236 241    Principal Problem:   Nausea and vomiting Active Problems:   Essential hypertension   Acute blood loss as cause of postoperative anemia   Anemia   Time coordinating discharge: 35 minutes  Signed:  Murray Hodgkins, MD [facility] 09/06/2017, 9:10 AM

## 2017-09-06 NOTE — Care Management Note (Signed)
Case Management Note  Patient Details  Name: Barbara Soto MRN: [id number] Date of Birth: 1940/11/17  Subjective/Objective:   Pt admitted with nausea and vomiting.                 Action/Plan:Plan to discharge home with [facility]    Expected Discharge Date:  09/06/17               Expected Discharge Plan:  [place]  In-House Referral:     Discharge planning Services     Post Acute Care Choice:  Home Health Choice offered to:  Patient  DME Arranged:    DME Agency:     HH Arranged:  PT [place]:  [facility] ([facility])  Status of Service:  Completed, signed off  If discussed at [employer], dates discussed:    Additional CommentsPurcell Mouton, RN 09/06/2017, 10:29 AM

## 2017-09-06 NOTE — Evaluation (Signed)
Physical Therapy Evaluation Patient Details Name: Barbara Soto MRN: [id number] DOB: 1941-06-01 Today's Date: 09/06/2017   History of Present Illness  Barbara Soto is a 77 y.o. female with medical history significant of R THA last Tues complicated by intra op blood loss, and low HGB of 6.4 on 2/1.  Blood transfusion was recommended but patient declined and was discharged on 2/1.  Since getting home she has had persistent N/V, inability to keep anything down.  No hematemesis.  Just got script for anti-emetics today but hasnt taken any.  Brought in to ED for persistent symptoms.  Clinical Impression  Pt admitted with above diagnosis. Pt currently with functional limitations due to the deficits listed below (see PT Problem List).  Pt will benefit from skilled PT to increase their independence and safety with mobility to allow discharge to the venue listed below.  Pt limited today by feeling of unsteadiness and feeling "drunk".  She had pain meds ~30 minutes before session and pt feels that this is side effect.  She was able to describe safe technique on stairs with RW and family assist.  Recommend returning home with family and HHPT.     Follow Up Recommendations Home health PT;Supervision/Assistance - 24 hour    Equipment Recommendations  None recommended by PT    Recommendations for Other Services       Precautions / Restrictions Precautions Precautions: Fall Restrictions Weight Bearing Restrictions: No RLE Weight Bearing: Weight bearing as tolerated      Mobility  Bed Mobility Overal bed mobility: Needs Assistance Bed Mobility: Supine to Sit     Supine to sit: Supervision     General bed mobility comments: Pt required increased time with rail and HOB elevated. States she sleeps in elevated bed  Transfers Overall transfer level: Needs assistance Equipment used: Rolling walker (2 wheeled) Transfers: Sit to/from Stand Sit to Stand: Min guard;Min assist         General  transfer comment: Pt stood 2x with min to min/guard to power up.  Attempted ambulation each time. Pt reporting feeling "drunk" which she feels is side effect of her medicine and declined ambulation besides SPt to chair.  Ambulation/Gait             General Gait Details: SPT only   Stairs         General stair comments: Did not perform today, but pt was able to correctly explain sequencing and how family assists with RW with backwards technique.  Wheelchair Mobility    Modified Rankin (Stroke Patients Only)       Balance Overall balance assessment: Needs assistance Sitting-balance support: Feet supported;No upper extremity supported Sitting balance-Leahy Scale: Fair     Standing balance support: Bilateral upper extremity supported Standing balance-Leahy Scale: Poor Standing balance comment: reliant onf RW                             Pertinent Vitals/Pain Pain Assessment: 0-10 Pain Score: 9  Pain Location: R lateral hip Pain Descriptors / Indicators: Aching;Sore Pain Intervention(s): Premedicated before session;Ice applied    Home Living Family/patient expects to be discharged to:: Private residence Living Arrangements: Children Available Help at Discharge: Family;Available 24 hours/day Type of Home: House Home Access: Stairs to enter Entrance Stairs-Rails: None Entrance Stairs-Number of Steps: 2 Home Layout: One level Home Equipment: Walker - 4 wheels;Bedside commode;Tub bench;Walker - 2 wheels Additional Comments: lift chair    Prior Function  Level of Independence: Independent         Comments: mobility and ADLs     Hand Dominance        Extremity/Trunk Assessment   Upper Extremity Assessment Upper Extremity Assessment: Overall WFL for tasks assessed    Lower Extremity Assessment Lower Extremity Assessment: RLE deficits/detail RLE Deficits / Details: decreased strength and ROm due to recent R hip surgery    Cervical / Trunk  Assessment Cervical / Trunk Assessment: Normal  Communication   Communication: No difficulties  Cognition Arousal/Alertness: Awake/alert Behavior During Therapy: WFL for tasks assessed/performed Overall Cognitive Status: Within Functional Limits for tasks assessed                                        General Comments      Exercises Total Joint Exercises Ankle Circles/Pumps: AROM;Both;Supine;20 reps Heel Slides: AROM;Right;Supine Hip ABduction/ADduction: AROM;Right;10 reps   Assessment/Plan    PT Assessment Patient needs continued PT services  PT Problem List Decreased strength;Decreased range of motion;Decreased activity tolerance;Decreased balance;Decreased mobility       PT Treatment Interventions DME instruction;Gait training;Functional mobility training;Stair training;Therapeutic activities;Therapeutic exercise;Balance training    PT Goals (Current goals can be found in the Care Plan section)  Acute Rehab PT Goals Patient Stated Goal: return to daugthers home with 24 hour care PT Goal Formulation: With patient Time For Goal Achievement: 09/20/17 Potential to Achieve Goals: Good    Frequency 7X/week   Barriers to discharge        Co-evaluation               AM-PAC PT "6 Clicks" Daily Activity  Outcome Measure Difficulty turning over in bed (including adjusting bedclothes, sheets and blankets)?: A Little Difficulty moving from lying on back to sitting on the side of the bed? : A Little Difficulty sitting down on and standing up from a chair with arms (e.g., wheelchair, bedside commode, etc,.)?: Unable Help needed moving to and from a bed to chair (including a wheelchair)?: A Little Help needed walking in hospital room?: A Lot Help needed climbing 3-5 steps with a railing? : A Lot 6 Click Score: 14    End of Session Equipment Utilized During Treatment: Gait belt Activity Tolerance: Patient limited by fatigue;Other (comment)(pt reports  feeling drunk and feels it is due to meds she had before PT session) Patient left: in chair;with chair alarm set;with call bell/phone within reach Nurse Communication: Mobility status PT Visit Diagnosis: Unsteadiness on feet (R26.81);Difficulty in walking, not elsewhere classified (R26.2) Pain - Right/Left: Right Pain - part of body: Hip    Time: 2563-8937 PT Time Calculation (min) (ACUTE ONLY): 36 min   Charges:   PT Evaluation $PT Eval Low Complexity: 1 Low PT Treatments $Therapeutic Activity: 8-22 mins   PT G Codes:        Noemi Ishmael L. Tamala Julian, [place] Pager [phone] 09/06/2017   Galen Manila 09/06/2017, 9:41 AM

## 2017-09-06 NOTE — Progress Notes (Signed)
Pt discharge to home with daughter and home health, instructions reviewed and questions addressed. Pain med given prior to leaving for 9/10 pain right hip. SRP, RN

## 2017-09-11 DIAGNOSIS — Z96641 Presence of right artificial hip joint: Secondary | ICD-10-CM | POA: Diagnosis not present

## 2017-09-12 DIAGNOSIS — Z9181 History of falling: Secondary | ICD-10-CM | POA: Diagnosis not present

## 2017-09-12 DIAGNOSIS — D649 Anemia, unspecified: Secondary | ICD-10-CM | POA: Diagnosis not present

## 2017-09-12 DIAGNOSIS — Z471 Aftercare following joint replacement surgery: Secondary | ICD-10-CM | POA: Diagnosis not present

## 2017-09-12 DIAGNOSIS — Z7982 Long term (current) use of aspirin: Secondary | ICD-10-CM | POA: Diagnosis not present

## 2017-09-12 DIAGNOSIS — I10 Essential (primary) hypertension: Secondary | ICD-10-CM | POA: Diagnosis not present

## 2017-09-12 DIAGNOSIS — Z96641 Presence of right artificial hip joint: Secondary | ICD-10-CM | POA: Diagnosis not present

## 2017-09-25 DIAGNOSIS — Z471 Aftercare following joint replacement surgery: Secondary | ICD-10-CM | POA: Diagnosis not present

## 2017-10-09 DIAGNOSIS — Z96641 Presence of right artificial hip joint: Secondary | ICD-10-CM | POA: Diagnosis not present

## 2017-10-09 DIAGNOSIS — Z471 Aftercare following joint replacement surgery: Secondary | ICD-10-CM | POA: Diagnosis not present

## 2017-10-09 DIAGNOSIS — M1611 Unilateral primary osteoarthritis, right hip: Secondary | ICD-10-CM | POA: Diagnosis not present

## 2017-10-17 ENCOUNTER — Ambulatory Visit (INDEPENDENT_AMBULATORY_CARE_PROVIDER_SITE_OTHER): Payer: PPO | Admitting: Family Medicine

## 2017-10-17 ENCOUNTER — Encounter: Payer: Self-pay | Admitting: Family Medicine

## 2017-10-17 ENCOUNTER — Other Ambulatory Visit: Payer: Self-pay

## 2017-10-17 VITALS — BP 130/72 | HR 100 | Temp 98.7°F | Ht 60.55 in | Wt 188.8 lb

## 2017-10-17 DIAGNOSIS — Z96641 Presence of right artificial hip joint: Secondary | ICD-10-CM

## 2017-10-17 DIAGNOSIS — M1611 Unilateral primary osteoarthritis, right hip: Secondary | ICD-10-CM | POA: Diagnosis not present

## 2017-10-17 NOTE — Patient Instructions (Signed)
     IF you received an x-ray today, you will receive an invoice from [facility]. Please contact [facility] at [phone] with questions or concerns regarding your invoice.   IF you received labwork today, you will receive an invoice from [facility]. Please contact [facility] at [phone] with questions or concerns regarding your invoice.   Our billing staff will not be able to assist you with questions regarding bills from these companies.  You will be contacted with the lab results as soon as they are available. The fastest way to get your results is to activate your My Chart account. Instructions are located on the last page of this paperwork. If you have not heard from us regarding the results in 2 weeks, please contact this office.     

## 2017-10-17 NOTE — Progress Notes (Signed)
3/19/20192:17 PM  Barbara Soto [birth year], 77 y.o. female [id number]  Chief Complaint  Patient presents with  . Establish Care    HPI:   Patient is a 77 y.o. female with past medical history significant for recent Right hip replacement who presents today to establish care  Denies any chronic medical conditions Has lost about 70 lbs, intentionally, resolved HTN RTHA on 08/29/17, had intraoperative blood loss, nadir hgb 6.4, readmitted a week later due to persistent nausea and vomiting, she was then transfused 2u PRBCs, dc hgb 9.3 She now feels significantly better, still has some occasional pain on lateral right hip, takes asa or vicodin prn if pain very severe She has completed PT and continues with HEP She at times feels slightly unsteady on right hip, uses a cane or walker as needed for support, denies any falls Lives alone, does her ADLs, drives.  Orthopedist, Dr Christena Flake  Has appointment with me in 2 days for CPE  Depression screen [facility] 2/9 10/17/2017  Decreased Interest 0  Down, Depressed, Hopeless 0  PHQ - 2 Score 0    Allergies  Allergen Reactions  . Adhesive [Tape] Other (See Comments)    Takes skin off   . Penicillins Rash and Other (See Comments)    PATIENT HAS HAD A PCN REACTION WITH IMMEDIATE RASH, FACIAL/TONGUE/THROAT SWELLING, SOB, OR LIGHTHEADEDNESS WITH HYPOTENSION:  #  #  #  YES  #  #  #   Has patient had a PCN reaction causing severe rash involving mucus membranes or skin necrosis: No Has patient had a PCN reaction that required hospitalization: No Has patient had a PCN reaction occurring within the last 10 years: No     Prior to Admission medications   Medication Sig Start Date End Date Taking? Authorizing Provider  aspirin EC 325 MG EC tablet Take 1 tablet (325 mg total) by mouth 2 (two) times daily after a meal. 09/01/17  Yes Macie Burows    Past Medical History:  Diagnosis Date  . Complication of anesthesia   . DJD (degenerative  joint disease)    right hip  . Hypertension   . PONV (postoperative nausea and vomiting)   . Wears dentures   . Wears glasses     Past Surgical History:  Procedure Laterality Date  . ABDOMINAL HYSTERECTOMY    . APPENDECTOMY    . CATARACT EXTRACTION W/ INTRAOCULAR LENS  IMPLANT, BILATERAL    . CHOLECYSTECTOMY    . DILATION AND CURETTAGE OF UTERUS    . MULTIPLE TOOTH EXTRACTIONS    . TOTAL HIP ARTHROPLASTY Right 08/29/2017   Procedure: TOTAL HIP ARTHROPLASTY ANTERIOR APPROACH;  Surgeon: Melrose Nakayama, MD;  Location: [place];  Service: Orthopedics;  Laterality: Right;    Social History   Tobacco Use  . Smoking status: Never Smoker  . Smokeless tobacco: Never Used  Substance Use Topics  . Alcohol use: No    Family History  Problem Relation Age of Onset  . Diabetes Mother     Review of Systems  Constitutional: Negative for chills and fever.  Respiratory: Negative for cough and shortness of breath.   Cardiovascular: Negative for chest pain, palpitations and leg swelling.  Gastrointestinal: Negative for abdominal pain, blood in stool, melena, nausea and vomiting.  Musculoskeletal: Positive for joint pain. Negative for falls.  Neurological: Negative for dizziness, tingling, focal weakness and headaches.     OBJECTIVE:  Blood pressure 130/72, pulse 100, temperature 98.7 F (37.1 C), height 5'  0.55" (1.538 m), weight 188 lb 12.8 oz (85.6 kg), SpO2 97 %.  Physical Exam  Constitutional: She is oriented to person, place, and time and well-developed, well-nourished, and in no distress.  HENT:  Head: Normocephalic and atraumatic.  Mouth/Throat: Oropharynx is clear and moist. No oropharyngeal exudate.  Eyes: EOM are normal. Pupils are equal, round, and reactive to light. No scleral icterus.  Neck: Neck supple.  Cardiovascular: Normal rate, regular rhythm and normal heart sounds. Exam reveals no gallop and no friction rub.  No murmur heard. Pulmonary/Chest: Effort normal and  breath sounds normal. She has no wheezes. She has no rales.  Neurological: She is alert and oriented to person, place, and time.  Using walker  Skin: Skin is warm and dry.  Psychiatric: Mood and affect normal.    ASSESSMENT and PLAN  1. Primary osteoarthritis of right hip 2. Status post right hip replacement Patient now doing well, required blood transfusion, will recheck CBC in 2 days when I see her for her annual. Continue with HEP and use of assisted device as needed for stability. Followup with ortho as scheduled.   Return for as scheduled for CPE.    Rutherford Guys, MD [facility] [place], [place] [address] Ph.  [phone] Fax [phone]

## 2017-10-19 ENCOUNTER — Other Ambulatory Visit: Payer: Self-pay

## 2017-10-19 ENCOUNTER — Telehealth: Payer: Self-pay | Admitting: Family Medicine

## 2017-10-19 ENCOUNTER — Encounter: Payer: Self-pay | Admitting: Family Medicine

## 2017-10-19 ENCOUNTER — Ambulatory Visit (INDEPENDENT_AMBULATORY_CARE_PROVIDER_SITE_OTHER): Payer: PPO | Admitting: Family Medicine

## 2017-10-19 VITALS — BP 140/82 | HR 81 | Temp 97.8°F | Ht 60.0 in | Wt 188.0 lb

## 2017-10-19 DIAGNOSIS — D649 Anemia, unspecified: Secondary | ICD-10-CM | POA: Diagnosis not present

## 2017-10-19 DIAGNOSIS — I10 Essential (primary) hypertension: Secondary | ICD-10-CM | POA: Diagnosis not present

## 2017-10-19 DIAGNOSIS — Z0000 Encounter for general adult medical examination without abnormal findings: Secondary | ICD-10-CM

## 2017-10-19 LAB — POCT URINALYSIS DIP (MANUAL ENTRY)
Bilirubin, UA: NEGATIVE
Blood, UA: NEGATIVE
Glucose, UA: NEGATIVE mg/dL
Ketones, POC UA: NEGATIVE mg/dL
Nitrite, UA: NEGATIVE
Protein Ur, POC: NEGATIVE mg/dL
Spec Grav, UA: 1.02 (ref 1.010–1.025)
Urobilinogen, UA: 0.2 E.U./dL
pH, UA: 6.5 (ref 5.0–8.0)

## 2017-10-19 MED ORDER — ASPIRIN EC 81 MG PO TBEC: 81.0000 mg | DELAYED_RELEASE_TABLET | Freq: Every day | ORAL | 11 refills | Status: DC

## 2017-10-19 NOTE — Progress Notes (Signed)
3/21/201910:08 AM  Barbara Soto July 27, 1941, 77 y.o. female [id number]  Chief Complaint  Patient presents with  . Annual Exam    HPI:   Patient is a 77 y.o. female with past medical history significant for HTN diet control, recent anemia due to operative blood loss and strong fhx DM who presents today for CPE  Cervical Cancer Screening: n/a Breast Cancer Screening: has mammogram scheduled for next week Colorectal Cancer Screening: declines Bone Density Testing: declines Seasonal Influenza Vaccination: declines Td/Tdap Vaccination: declines Pneumococcal Vaccination: declines Zoster Vaccination: declines Frequency of Dental evaluation: Q6 months Frequency of Eye evaluation: once a year  Depression screen [facility] 2/9 10/19/2017 10/17/2017  Decreased Interest 0 0  Down, Depressed, Hopeless 0 0  PHQ - 2 Score 0 0    Allergies  Allergen Reactions  . Adhesive [Tape] Other (See Comments)    Takes skin off   . Penicillins Rash and Other (See Comments)    PATIENT HAS HAD A PCN REACTION WITH IMMEDIATE RASH, FACIAL/TONGUE/THROAT SWELLING, SOB, OR LIGHTHEADEDNESS WITH HYPOTENSION:  #  #  #  YES  #  #  #   Has patient had a PCN reaction causing severe rash involving mucus membranes or skin necrosis: No Has patient had a PCN reaction that required hospitalization: No Has patient had a PCN reaction occurring within the last 10 years: No     Prior to Admission medications   Medication Sig Start Date End Date Taking? Authorizing Provider  aspirin EC 81 MG EC tablet Take 1 tablet (81 mg total) by mouth daily after a meal. 09/01/17  Yes Loni Dolly, PA-C  HYDROcodone-acetaminophen (NORCO/VICODIN) 5-325 MG tablet Take 1-2 tablets by mouth every 4 (four) hours as needed. 09/01/17  Yes Macie Burows    Past Medical History:  Diagnosis Date  . Complication of anesthesia   . DJD (degenerative joint disease)    right hip  . Hypertension   . PONV (postoperative nausea and vomiting)   .  Wears dentures   . Wears glasses     Past Surgical History:  Procedure Laterality Date  . ABDOMINAL HYSTERECTOMY    . APPENDECTOMY    . CATARACT EXTRACTION W/ INTRAOCULAR LENS  IMPLANT, BILATERAL    . CHOLECYSTECTOMY    . DILATION AND CURETTAGE OF UTERUS    . MULTIPLE TOOTH EXTRACTIONS    . TOTAL HIP ARTHROPLASTY Right 08/29/2017   Procedure: TOTAL HIP ARTHROPLASTY ANTERIOR APPROACH;  Surgeon: Melrose Nakayama, MD;  Location: [place];  Service: Orthopedics;  Laterality: Right;    Social History   Tobacco Use  . Smoking status: Never Smoker  . Smokeless tobacco: Never Used  Substance Use Topics  . Alcohol use: No    Family History  Problem Relation Age of Onset  . Diabetes Mother     Review of Systems  Constitutional: Negative for chills, fever and malaise/fatigue.  HENT: Negative for congestion, ear pain and sore throat.   Eyes: Negative for blurred vision and double vision.  Respiratory: Negative for cough and shortness of breath.   Cardiovascular: Negative for chest pain, palpitations and leg swelling.  Gastrointestinal: Negative for abdominal pain, blood in stool, constipation, diarrhea, melena, nausea and vomiting.  Genitourinary: Negative for dysuria and hematuria.  Musculoskeletal: Positive for joint pain. Negative for falls.  Neurological: Negative for dizziness and headaches.  Psychiatric/Behavioral: Negative for depression. The patient is not nervous/anxious.   All other systems reviewed and are negative.    OBJECTIVE:  Blood pressure  140/82, pulse 81, temperature 97.8 F (36.6 C), temperature source Oral, height 5' (1.524 m), weight 188 lb (85.3 kg), SpO2 98 %.  Physical Exam  Constitutional: She is oriented to person, place, and time and well-developed, well-nourished, and in no distress.  HENT:  Head: Normocephalic and atraumatic.  Right Ear: Hearing, tympanic membrane, external ear and ear canal normal.  Left Ear: Hearing, tympanic membrane, external  ear and ear canal normal.  Mouth/Throat: Oropharynx is clear and moist.  Eyes: Pupils are equal, round, and reactive to light. EOM are normal.  Neck: Neck supple. No thyromegaly present.  Cardiovascular: Normal rate, regular rhythm, normal heart sounds and intact distal pulses. Exam reveals no gallop and no friction rub.  No murmur heard. Pulmonary/Chest: Effort normal and breath sounds normal. She has no wheezes. She has no rales. Right breast exhibits no inverted nipple, no mass, no nipple discharge, no skin change and no tenderness. Left breast exhibits no inverted nipple, no mass, no nipple discharge, no skin change and no tenderness. Breasts are symmetrical.  Abdominal: Soft. Bowel sounds are normal. She exhibits no distension and no mass. There is no tenderness.  Musculoskeletal: Normal range of motion. She exhibits no edema.       Right hip: She exhibits tenderness and swelling.  Lymphadenopathy:    She has no cervical adenopathy.    She has no axillary adenopathy.       Right: No supraclavicular adenopathy present.       Left: No supraclavicular adenopathy present.  Neurological: She is alert and oriented to person, place, and time. She has normal reflexes.  Skin: Skin is warm and dry.  Psychiatric: Mood and affect normal.  Nursing note and vitals reviewed.   Results for orders placed or performed in visit on 10/19/17 (from the past 24 hour(s))  POCT urinalysis dipstick     Status: Abnormal   Collection Time: 10/19/17 11:11 AM  Result Value Ref Range   Color, UA yellow yellow   Clarity, UA clear clear   Glucose, UA negative negative mg/dL   Bilirubin, UA negative negative   Ketones, POC UA negative negative mg/dL   Spec Grav, UA 1.020 1.010 - 1.025   Blood, UA negative negative   pH, UA 6.5 5.0 - 8.0   Protein Ur, POC negative negative mg/dL   Urobilinogen, UA 0.2 0.2 or 1.0 E.U./dL   Nitrite, UA Negative Negative   Leukocytes, UA Trace (A) Negative     ASSESSMENT and  PLAN  1. Anemia, unspecified type - CBC  2. Essential hypertension - Comprehensive metabolic panel - POCT urinalysis dipstick - Lipid panel - TSH  3. Annual physical exam No concerns per history or exam. Routine HCM labs ordered. HCM reviewed/discussed. Anticipatory guidance regarding healthy weight, lifestyle and choices given.    Other orders - aspirin 81 MG tablet; Take 1 tablet (81 mg total) by mouth daily.  Return in about 1 year (around 10/20/2018).    Rutherford Guys, MD Primary Care at [place], [place] [address] Ph.  [phone] Fax [phone]

## 2017-10-19 NOTE — Patient Instructions (Addendum)
   IF you received an x-ray today, you will receive an invoice from [facility]. Please contact [facility] at [phone] with questions or concerns regarding your invoice.   IF you received labwork today, you will receive an invoice from [facility]. Please contact [facility] at [phone] with questions or concerns regarding your invoice.   Our billing staff will not be able to assist you with questions regarding bills from these companies.  You will be contacted with the lab results as soon as they are available. The fastest way to get your results is to activate your My Chart account. Instructions are located on the last page of this paperwork. If you have not heard from us regarding the results in 2 weeks, please contact this office.      Preventive Care 65 Years and Older, Female Preventive care refers to lifestyle choices and visits with your health care provider that can promote health and wellness. What does preventive care include?  A yearly physical exam. This is also called an annual well check.  Dental exams once or twice a year.  Routine eye exams. Ask your health care provider how often you should have your eyes checked.  Personal lifestyle choices, including: ? Daily care of your teeth and gums. ? Regular physical activity. ? Eating a healthy diet. ? Avoiding tobacco and drug use. ? Limiting alcohol use. ? Practicing safe sex. ? Taking low-dose aspirin every day. ? Taking vitamin and mineral supplements as recommended by your health care provider. What happens during an annual well check? The services and screenings done by your health care provider during your annual well check will depend on your age, overall health, lifestyle risk factors, and family history of disease. Counseling Your health care provider may ask you questions about your:  Alcohol use.  Tobacco use.  Drug use.  Emotional well-being.  Home and relationship  well-being.  Sexual activity.  Eating habits.  History of falls.  Memory and ability to understand (cognition).  Work and work environment.  Reproductive health.  Screening You may have the following tests or measurements:  Height, weight, and BMI.  Blood pressure.  Lipid and cholesterol levels. These may be checked every 5 years, or more frequently if you are over 50 years old.  Skin check.  Lung cancer screening. You may have this screening every year starting at age 55 if you have a 30-pack-year history of smoking and currently smoke or have quit within the past 15 years.  Fecal occult blood test (FOBT) of the stool. You may have this test every year starting at age 50.  Flexible sigmoidoscopy or colonoscopy. You may have a sigmoidoscopy every 5 years or a colonoscopy every 10 years starting at age 50.  Hepatitis C blood test.  Hepatitis B blood test.  Sexually transmitted disease (STD) testing.  Diabetes screening. This is done by checking your blood sugar (glucose) after you have not eaten for a while (fasting). You may have this done every 1-3 years.  Bone density scan. This is done to screen for osteoporosis. You may have this done starting at age 65.  Mammogram. This may be done every 1-2 years. Talk to your health care provider about how often you should have regular mammograms.  Talk with your health care provider about your test results, treatment options, and if necessary, the need for more tests. Vaccines Your health care provider may recommend certain vaccines, such as:  Influenza vaccine. This is recommended every year.    Tetanus, diphtheria, and acellular pertussis (Tdap, Td) vaccine. You may need a Td booster every 10 years.  Varicella vaccine. You may need this if you have not been vaccinated.  Zoster vaccine. You may need this after age 60.  Measles, mumps, and rubella (MMR) vaccine. You may need at least one dose of MMR if you were born in  1957 or later. You may also need a second dose.  Pneumococcal 13-valent conjugate (PCV13) vaccine. One dose is recommended after age 65.  Pneumococcal polysaccharide (PPSV23) vaccine. One dose is recommended after age 65.  Meningococcal vaccine. You may need this if you have certain conditions.  Hepatitis A vaccine. You may need this if you have certain conditions or if you travel or work in places where you may be exposed to hepatitis A.  Hepatitis B vaccine. You may need this if you have certain conditions or if you travel or work in places where you may be exposed to hepatitis B.  Haemophilus influenzae type b (Hib) vaccine. You may need this if you have certain conditions.  Talk to your health care provider about which screenings and vaccines you need and how often you need them. This information is not intended to replace advice given to you by your health care provider. Make sure you discuss any questions you have with your health care provider. Document Released: 08/14/2015 Document Revised: 04/06/2016 Document Reviewed: 05/19/2015 Elsevier Interactive Patient Education  2018 Elsevier Inc.  

## 2017-10-19 NOTE — Telephone Encounter (Signed)
Was sleep addressed at today's visit? Please prescribe if appropriate.

## 2017-10-19 NOTE — Telephone Encounter (Signed)
Copied from [place]. Topic: Quick Communication - See Telephone Encounter >> Oct 19, 2017 11:53 AM Synthia Innocent wrote: CRM for notification. See Telephone encounter for: 10/19/17. Patient just seen, would like to know if Dr Pamella Pert will call her something in for sleep, on sleeping 2 hrs at night. Requesting ambien. [facility] [address] Please advise

## 2017-10-20 LAB — CBC
Hematocrit: 37.1 % (ref 34.0–46.6)
Hemoglobin: 11.7 g/dL (ref 11.1–15.9)
MCH: 27.4 pg (ref 26.6–33.0)
MCHC: 31.5 g/dL (ref 31.5–35.7)
MCV: 87 fL (ref 79–97)
Platelets: 325 10*3/uL (ref 150–379)
RBC: 4.27 x10E6/uL (ref 3.77–5.28)
RDW: 14.3 % (ref 12.3–15.4)
WBC: 6.1 10*3/uL (ref 3.4–10.8)

## 2017-10-20 LAB — LIPID PANEL
Chol/HDL Ratio: 2.5 ratio (ref 0.0–4.4)
Cholesterol, Total: 143 mg/dL (ref 100–199)
HDL: 57 mg/dL (ref >39)
LDL Calculated: 57 mg/dL (ref 0–99)
Triglycerides: 145 mg/dL (ref 0–149)
VLDL Cholesterol Cal: 29 mg/dL (ref 5–40)

## 2017-10-20 LAB — COMPREHENSIVE METABOLIC PANEL
ALT: 12 IU/L (ref 0–32)
AST: 35 IU/L (ref 0–40)
Albumin/Globulin Ratio: 1.4 (ref 1.2–2.2)
Albumin: 4.1 g/dL (ref 3.5–4.8)
Alkaline Phosphatase: 99 IU/L (ref 39–117)
BUN/Creatinine Ratio: 15 (ref 12–28)
BUN: 12 mg/dL (ref 8–27)
Bilirubin Total: 0.5 mg/dL (ref 0.0–1.2)
CO2: 25 mmol/L (ref 20–29)
Calcium: 9.4 mg/dL (ref 8.7–10.3)
Chloride: 102 mmol/L (ref 96–106)
Creatinine, Ser: 0.79 mg/dL (ref 0.57–1.00)
GFR calc Af Amer: 85 mL/min/{1.73_m2} (ref >59)
GFR calc non Af Amer: 73 mL/min/{1.73_m2} (ref >59)
Globulin, Total: 2.9 g/dL (ref 1.5–4.5)
Glucose: 101 mg/dL — ABNORMAL HIGH (ref 65–99)
Potassium: 5.3 mmol/L — ABNORMAL HIGH (ref 3.5–5.2)
Sodium: 141 mmol/L (ref 134–144)
Total Protein: 7 g/dL (ref 6.0–8.5)

## 2017-10-20 LAB — TSH: TSH: 1.5 u[IU]/mL (ref 0.450–4.500)

## 2017-10-20 MED ORDER — TRAZODONE HCL 50 MG PO TABS: 50.0000 mg | ORAL_TABLET | Freq: Every evening | ORAL | 3 refills | Status: DC | PRN

## 2017-10-20 NOTE — Telephone Encounter (Signed)
We did not discuss sleep in too much detail, I know that her hip still hurts her. She is still taking pain medication. I worry about interactions between [place] and vicodin. I will send a prescription for trazodone instead. thanks

## 2017-10-23 NOTE — Telephone Encounter (Signed)
Phone call to patient. She confirms she was able to pick up Trazodone, states she has been getting 7 hours of sleep a night, is feeling better.

## 2017-10-30 DIAGNOSIS — Z961 Presence of intraocular lens: Secondary | ICD-10-CM | POA: Diagnosis not present

## 2017-10-30 DIAGNOSIS — H35373 Puckering of macula, bilateral: Secondary | ICD-10-CM | POA: Diagnosis not present

## 2017-10-30 DIAGNOSIS — H35362 Drusen (degenerative) of macula, left eye: Secondary | ICD-10-CM | POA: Diagnosis not present

## 2017-10-30 DIAGNOSIS — H1851 Endothelial corneal dystrophy: Secondary | ICD-10-CM | POA: Diagnosis not present

## 2017-10-31 ENCOUNTER — Encounter: Payer: Self-pay | Admitting: Family Medicine

## 2017-10-31 ENCOUNTER — Other Ambulatory Visit: Payer: Self-pay | Admitting: Family Medicine

## 2017-10-31 DIAGNOSIS — E875 Hyperkalemia: Secondary | ICD-10-CM

## 2017-10-31 NOTE — Progress Notes (Signed)
bmp 

## 2017-11-01 DIAGNOSIS — Z1231 Encounter for screening mammogram for malignant neoplasm of breast: Secondary | ICD-10-CM | POA: Diagnosis not present

## 2017-11-01 LAB — HM MAMMOGRAPHY

## 2017-11-06 DIAGNOSIS — M1611 Unilateral primary osteoarthritis, right hip: Secondary | ICD-10-CM | POA: Diagnosis not present

## 2017-11-27 DIAGNOSIS — Z96641 Presence of right artificial hip joint: Secondary | ICD-10-CM | POA: Diagnosis not present

## 2017-11-27 DIAGNOSIS — M1611 Unilateral primary osteoarthritis, right hip: Secondary | ICD-10-CM | POA: Diagnosis not present

## 2017-11-27 DIAGNOSIS — Z471 Aftercare following joint replacement surgery: Secondary | ICD-10-CM | POA: Diagnosis not present

## 2017-12-20 DIAGNOSIS — L570 Actinic keratosis: Secondary | ICD-10-CM | POA: Diagnosis not present

## 2017-12-20 DIAGNOSIS — D485 Neoplasm of uncertain behavior of skin: Secondary | ICD-10-CM | POA: Diagnosis not present

## 2018-01-08 ENCOUNTER — Encounter: Payer: Self-pay | Admitting: *Deleted

## 2018-01-12 DIAGNOSIS — Z09 Encounter for follow-up examination after completed treatment for conditions other than malignant neoplasm: Secondary | ICD-10-CM | POA: Diagnosis not present

## 2018-01-12 DIAGNOSIS — Z96641 Presence of right artificial hip joint: Secondary | ICD-10-CM | POA: Diagnosis not present

## 2018-01-12 DIAGNOSIS — M1611 Unilateral primary osteoarthritis, right hip: Secondary | ICD-10-CM | POA: Diagnosis not present

## 2018-01-19 DIAGNOSIS — M25511 Pain in right shoulder: Secondary | ICD-10-CM | POA: Diagnosis not present

## 2018-02-19 ENCOUNTER — Other Ambulatory Visit: Payer: Self-pay

## 2018-02-19 ENCOUNTER — Ambulatory Visit: Payer: Self-pay | Admitting: *Deleted

## 2018-02-19 ENCOUNTER — Ambulatory Visit (INDEPENDENT_AMBULATORY_CARE_PROVIDER_SITE_OTHER): Payer: PPO | Admitting: Family Medicine

## 2018-02-19 ENCOUNTER — Encounter: Payer: Self-pay | Admitting: Family Medicine

## 2018-02-19 VITALS — BP 120/72 | HR 82 | Temp 98.8°F | Resp 16 | Ht 60.0 in | Wt 201.6 lb

## 2018-02-19 DIAGNOSIS — M25511 Pain in right shoulder: Secondary | ICD-10-CM

## 2018-02-19 DIAGNOSIS — J209 Acute bronchitis, unspecified: Secondary | ICD-10-CM

## 2018-02-19 MED ORDER — AZELASTINE HCL 0.1 % NA SOLN: 1.0000 | Freq: Two times a day (BID) | NASAL | 0 refills | Status: DC

## 2018-02-19 MED ORDER — AZITHROMYCIN 250 MG PO TABS: ORAL_TABLET | ORAL | 0 refills | Status: AC

## 2018-02-19 MED ORDER — IBUPROFEN 600 MG PO TABS: 600.0000 mg | ORAL_TABLET | Freq: Three times a day (TID) | ORAL | 0 refills | Status: DC | PRN

## 2018-02-19 MED ORDER — DOXYCYCLINE HYCLATE 100 MG PO TABS: 100.0000 mg | ORAL_TABLET | Freq: Two times a day (BID) | ORAL | 0 refills | Status: DC

## 2018-02-19 MED ORDER — HYDROCODONE-ACETAMINOPHEN 5-325 MG PO TABS: 1.0000 | ORAL_TABLET | Freq: Four times a day (QID) | ORAL | 0 refills | Status: DC | PRN

## 2018-02-19 NOTE — Progress Notes (Signed)
7/22/201910:31 AM  Jorge Mandril [birth year], 77 y.o. female [id number]  Chief Complaint  Patient presents with  . productive cough since 02/14/18    chest is tight, dark green mucus from chest and yellow mucus drainage from nasal passages.  Per pt she is not taking any otc medications for the cough.    HPI:   Patient is a 77 y.o. female who presents today with several concerns  1. Cough - productive thick greenish phlegm for about a week now, has chest tightness but no SOB and no chest pain. Reports nasal congestion also with thick mucous. Denies any sneezing, itchy ears, throat or eyes. Denies any sore throat, ear or sinus pain. Denies any fever or chills. Vomited once. Denies diarrhea. Has not taken anything for this.  2. Right shoulder pain - started after she was moving a refrigerator by herself. She saw her ortho. Reports no acute findings on xray nor damage to rotator cuff. Reports bone spurs. She reports he did an injection which did not help. She has been taking APAP without help. Has not been doing NSAIDs. He sent her a rx for tramadol that she did not take as it has never helped in the past. She is requesting a small prescription for vicodin. [place] PMP reviewed and consistent. She reports pain with elevating shoulder, has some tingling down her upper arm. Denies any frank weakness.  Fall Risk  02/19/2018 10/19/2017 10/17/2017  Falls in the past year? No No No     Depression screen [facility] 2/9 02/19/2018 10/19/2017 10/17/2017  Decreased Interest 0 0 0  Down, Depressed, Hopeless 0 0 0  PHQ - 2 Score 0 0 0    Allergies  Allergen Reactions  . Adhesive [Tape] Other (See Comments)    Takes skin off   . Penicillins Rash and Other (See Comments)    PATIENT HAS HAD A PCN REACTION WITH IMMEDIATE RASH, FACIAL/TONGUE/THROAT SWELLING, SOB, OR LIGHTHEADEDNESS WITH HYPOTENSION:  #  #  #  YES  #  #  #   Has patient had a PCN reaction causing severe rash involving mucus membranes or skin necrosis:  No Has patient had a PCN reaction that required hospitalization: No Has patient had a PCN reaction occurring within the last 10 years: No     Prior to Admission medications   Medication Sig Start Date End Date Taking? Authorizing Provider  aspirin 81 MG tablet Take 1 tablet (81 mg total) by mouth daily. 10/19/17  Yes Rutherford Guys, MD    Past Medical History:  Diagnosis Date  . Complication of anesthesia   . DJD (degenerative joint disease)    right hip  . Hypertension   . PONV (postoperative nausea and vomiting)   . Wears dentures   . Wears glasses     Past Surgical History:  Procedure Laterality Date  . ABDOMINAL HYSTERECTOMY    . APPENDECTOMY    . CATARACT EXTRACTION W/ INTRAOCULAR LENS  IMPLANT, BILATERAL    . CHOLECYSTECTOMY    . DILATION AND CURETTAGE OF UTERUS    . MULTIPLE TOOTH EXTRACTIONS    . TOTAL HIP ARTHROPLASTY Right 08/29/2017   Procedure: TOTAL HIP ARTHROPLASTY ANTERIOR APPROACH;  Surgeon: Melrose Nakayama, MD;  Location: [place];  Service: Orthopedics;  Laterality: Right;    Social History   Tobacco Use  . Smoking status: Never Smoker  . Smokeless tobacco: Never Used  Substance Use Topics  . Alcohol use: No    Family History  Problem Relation Age of Onset  . Diabetes Mother     ROS Per hpi  OBJECTIVE:  Blood pressure 120/72, pulse 82, temperature 98.8 F (37.1 C), temperature source Oral, resp. rate 16, height 5' (1.524 m), weight 201 lb 9.6 oz (91.4 kg), SpO2 97 %. Body mass index is 39.37 kg/m.   Physical Exam  Constitutional: She is oriented to person, place, and time. She appears well-developed and well-nourished.  HENT:  Head: Normocephalic and atraumatic.  Right Ear: Hearing, tympanic membrane, external ear and ear canal normal.  Left Ear: Hearing, tympanic membrane, external ear and ear canal normal.  Mouth/Throat: Oropharynx is clear and moist.  Eyes: Pupils are equal, round, and reactive to light. EOM are normal.  Neck: Neck  supple.  Cardiovascular: Normal rate, regular rhythm and normal heart sounds. Exam reveals no gallop and no friction rub.  No murmur heard. Pulmonary/Chest: Effort normal and breath sounds normal. She has no wheezes. She has no rales.  Lymphadenopathy:    She has no cervical adenopathy.  Neurological: She is alert and oriented to person, place, and time.  Skin: Skin is warm and dry.  Psychiatric: She has a normal mood and affect.  Nursing note and vitals reviewed.    ASSESSMENT and PLAN  1. Acute bronchitis, unspecified organism Discussed seems viral per exam. Discussed supportive measures: increase hydration, OTC medications, etc. RTC precautions discussed. Reviewed delayed abx prescription,  2. Acute pain of right shoulder Discussed RICE therapy. Prescriptions for ibuprofen and vicodin given.   Other orders - azelastine (ASTELIN) 0.1 % nasal spray; Place 1 spray into both nostrils 2 (two) times daily. Use in each nostril as directed - HYDROcodone-acetaminophen (NORCO/VICODIN) 5-325 MG tablet; Take 1 tablet by mouth every 6 (six) hours as needed. - ibuprofen (ADVIL,MOTRIN) 600 MG tablet; Take 1 tablet (600 mg total) by mouth every 8 (eight) hours as needed. - azithromycin (ZITHROMAX) 250 MG tablet; Take 2 tablets (500 mg total) by mouth daily for 1 day, THEN 1 tablet (250 mg total) daily for 4 days.  Return if symptoms worsen or fail to improve.    Rutherford Guys, MD Primary Care at [place],  [address] Ph.  [phone] Fax [phone]

## 2018-02-19 NOTE — Patient Instructions (Addendum)
1. I do not think you need the antibiotic as of now as you are not having fever nor shortness of breath. Please lets try some conservative measures first. I have sent a prescription for a nasal spray. I would like for you to use over the counter mucinex, drink lots of water and use a humidifier at bedtime if you have one.     IF you received an x-ray today, you will receive an invoice from [facility]. Please contact [facility] at [phone] with questions or concerns regarding your invoice.   IF you received labwork today, you will receive an invoice from [facility]. Please contact [facility] at [phone] with questions or concerns regarding your invoice.   Our billing staff will not be able to assist you with questions regarding bills from these companies.  You will be contacted with the lab results as soon as they are available. The fastest way to get your results is to activate your My Chart account. Instructions are located on the last page of this paperwork. If you have not heard from [place] regarding the results in 2 weeks, please contact this office.     Acute Bronchitis, Adult Acute bronchitis is sudden (acute) swelling of the air tubes (bronchi) in the lungs. Acute bronchitis causes these tubes to fill with mucus, which can make it hard to breathe. It can also cause coughing or wheezing. In adults, acute bronchitis usually goes away within 2 weeks. A cough caused by bronchitis may last up to 3 weeks. Smoking, allergies, and asthma can make the condition worse. Repeated episodes of bronchitis may cause further lung problems, such as chronic obstructive pulmonary disease (COPD). What are the causes? This condition can be caused by germs and by substances that irritate the lungs, including:  Cold and flu viruses. This condition is most often caused by the same virus that causes a cold.  Bacteria.  Exposure to tobacco smoke, dust, fumes, and air pollution.  What  increases the risk? This condition is more likely to develop in people who:  Have close contact with someone with acute bronchitis.  Are exposed to lung irritants, such as tobacco smoke, dust, fumes, and vapors.  Have a weak immune system.  Have a respiratory condition such as asthma.  What are the signs or symptoms? Symptoms of this condition include:  A cough.  Coughing up clear, yellow, or green mucus.  Wheezing.  Chest congestion.  Shortness of breath.  A fever.  Body aches.  Chills.  A sore throat.  How is this diagnosed? This condition is usually diagnosed with a physical exam. During the exam, your health care provider may order tests, such as chest X-rays, to rule out other conditions. He or she may also:  Test a sample of your mucus for bacterial infection.  Check the level of oxygen in your blood. This is done to check for pneumonia.  Do a chest X-ray or lung function testing to rule out pneumonia and other conditions.  Perform blood tests.  Your health care provider will also ask about your symptoms and medical history. How is this treated? Most cases of acute bronchitis clear up over time without treatment. Your health care provider may recommend:  Drinking more fluids. Drinking more makes your mucus thinner, which may make it easier to breathe.  Taking a medicine for a fever or cough.  Taking an antibiotic medicine.  Using an inhaler to help improve shortness of breath and to control a cough.  Using a  cool mist vaporizer or humidifier to make it easier to breathe.  Follow these instructions at home: Medicines  Take over-the-counter and prescription medicines only as told by your health care provider.  If you were prescribed an antibiotic, take it as told by your health care provider. Do not stop taking the antibiotic even if you start to feel better. General instructions  Get plenty of rest.  Drink enough fluids to keep your urine clear  or pale yellow.  Avoid smoking and secondhand smoke. Exposure to cigarette smoke or irritating chemicals will make bronchitis worse. If you smoke and you need help quitting, ask your health care provider. Quitting smoking will help your lungs heal faster.  Use an inhaler, cool mist vaporizer, or humidifier as told by your health care provider.  Keep all follow-up visits as told by your health care provider. This is important. How is this prevented? To lower your risk of getting this condition again:  Wash your hands often with soap and water. If soap and water are not available, use hand sanitizer.  Avoid contact with people who have cold symptoms.  Try not to touch your hands to your mouth, nose, or eyes.  Make sure to get the flu shot every year.  Contact a health care provider if:  Your symptoms do not improve in 2 weeks of treatment. Get help right away if:  You cough up blood.  You have chest pain.  You have severe shortness of breath.  You become dehydrated.  You faint or keep feeling like you are going to faint.  You keep vomiting.  You have a severe headache.  Your fever or chills gets worse. This information is not intended to replace advice given to you by your health care provider. Make sure you discuss any questions you have with your health care provider. Document Released: 08/25/2004 Document Revised: 02/10/2016 Document Reviewed: 01/06/2016 Elsevier Interactive Patient Education  [employer].

## 2018-02-19 NOTE — Telephone Encounter (Signed)
Pt reports productive cough x 6 days, worsening. Productive for "Dark greenish phlegm with yellow clumps." Also reports chest tightness, mid chest with coughing "Between breast bone;" does not worsen with inspiration. States right ear "A little sore and clogged." Denies fever, wheezing, SOB. Same day appt made with Dr. Pamella Pert. Care advise given per protocol. Reason for Disposition . Earache  Answer Assessment - Initial Assessment Questions 1. ONSET: "When did the cough begin?"      6 days ago 2. SEVERITY: "How bad is the cough today?"      moderate 3. RESPIRATORY DISTRESS: "Describe your breathing."      Mid chest tightness after coughing 4. FEVER: "Do you have a fever?" If so, ask: "What is your temperature, how was it measured, and when did it start?"     no 5. SPUTUM: "Describe the color of your sputum" (clear, white, yellow, green)     Greenish with yellowish "Clumps" at times 6. HEMOPTYSIS: "Are you coughing up any blood?" If so ask: "How much?" (flecks, streaks, tablespoons, etc.)     no 7. CARDIAC HISTORY: "Do you have any history of heart disease?" (e.g., heart attack, congestive heart failure)      no 8. LUNG HISTORY: "Do you have any history of lung disease?"  (e.g., pulmonary embolus, asthma, emphysema)     no 9. PE RISK FACTORS: "Do you have a history of blood clots?" (or: recent major surgery, recent prolonged travel, bedridden)     no 10. OTHER SYMPTOMS: "Do you have any other symptoms?" (e.g., runny nose, wheezing, chest pain)      Mid chest tightness with coughing.  Protocols used: COUGH - ACUTE PRODUCTIVE-A-AH

## 2018-03-07 ENCOUNTER — Ambulatory Visit: Payer: Self-pay | Admitting: Family Medicine

## 2018-03-07 ENCOUNTER — Encounter: Payer: Self-pay | Admitting: Family Medicine

## 2018-03-07 ENCOUNTER — Other Ambulatory Visit: Payer: Self-pay

## 2018-03-07 ENCOUNTER — Ambulatory Visit (INDEPENDENT_AMBULATORY_CARE_PROVIDER_SITE_OTHER): Payer: PPO

## 2018-03-07 ENCOUNTER — Ambulatory Visit (INDEPENDENT_AMBULATORY_CARE_PROVIDER_SITE_OTHER): Payer: PPO | Admitting: Family Medicine

## 2018-03-07 VITALS — BP 150/72 | HR 85 | Temp 98.3°F | Ht 60.63 in | Wt 198.2 lb

## 2018-03-07 DIAGNOSIS — R05 Cough: Secondary | ICD-10-CM

## 2018-03-07 DIAGNOSIS — R059 Cough, unspecified: Secondary | ICD-10-CM

## 2018-03-07 DIAGNOSIS — J300 Vasomotor rhinitis: Secondary | ICD-10-CM

## 2018-03-07 MED ORDER — BENZONATATE 100 MG PO CAPS: 100.0000 mg | ORAL_CAPSULE | Freq: Three times a day (TID) | ORAL | 0 refills | Status: DC | PRN

## 2018-03-07 MED ORDER — FLUTICASONE PROPIONATE 50 MCG/ACT NA SUSP: 1.0000 | Freq: Two times a day (BID) | NASAL | 6 refills | Status: DC

## 2018-03-07 MED ORDER — CETIRIZINE HCL 5 MG PO TABS: 5.0000 mg | ORAL_TABLET | Freq: Every day | ORAL | 11 refills | Status: DC

## 2018-03-07 NOTE — Progress Notes (Signed)
8/7/20192:15 PM  Barbara Soto [birth year], 77 y.o. female [id number]  Chief Complaint  Patient presents with  . Cough    having productive cough, the z pak that she was given is not working    HPI:   Patient is a 77 y.o. female with past medical history significant for chronic pain who presents today for cough  Seen in 02/19/18 for similar concerns Started on azelastine and azithromycin Cough never resolved, mucous never improved Now having pain with breathing Now more productive, thick green mucous, it looked pink once No fever or chills No SOB or wheezing No sinus pain, minimal rhinorrhea, no ear pain, no sore throat Vomiting once but thinks related to greasy foods  Cant take liquid OTC meds, has not tried any other OTC except benadryl, helps a bit Coughs mostly after eating   Non smoker  Fall Risk  03/07/2018 02/19/2018 10/19/2017 10/17/2017  Falls in the past year? No No No No     Depression screen [facility] 2/9 03/07/2018 02/19/2018 10/19/2017  Decreased Interest 0 0 0  Down, Depressed, Hopeless 0 0 0  PHQ - 2 Score 0 0 0    Allergies  Allergen Reactions  . Adhesive [Tape] Other (See Comments)    Takes skin off   . Penicillins Rash and Other (See Comments)    PATIENT HAS HAD A PCN REACTION WITH IMMEDIATE RASH, FACIAL/TONGUE/THROAT SWELLING, SOB, OR LIGHTHEADEDNESS WITH HYPOTENSION:  #  #  #  YES  #  #  #   Has patient had a PCN reaction causing severe rash involving mucus membranes or skin necrosis: No Has patient had a PCN reaction that required hospitalization: No Has patient had a PCN reaction occurring within the last 10 years: No     Prior to Admission medications   Medication Sig Start Date End Date Taking? Authorizing Provider  aspirin 81 MG tablet Take 1 tablet (81 mg total) by mouth daily. 10/19/17  Yes Rutherford Guys, MD  azelastine (ASTELIN) 0.1 % nasal spray Place 1 spray into both nostrils 2 (two) times daily. Use in each nostril as directed 02/19/18  Yes  Rutherford Guys, MD  HYDROcodone-acetaminophen (NORCO/VICODIN) 5-325 MG tablet Take 1 tablet by mouth every 6 (six) hours as needed. 02/19/18  Yes Rutherford Guys, MD  ibuprofen (ADVIL,MOTRIN) 600 MG tablet Take 1 tablet (600 mg total) by mouth every 8 (eight) hours as needed. 02/19/18  Yes Rutherford Guys, MD    Past Medical History:  Diagnosis Date  . Complication of anesthesia   . DJD (degenerative joint disease)    right hip  . Hypertension   . PONV (postoperative nausea and vomiting)   . Wears dentures   . Wears glasses     Past Surgical History:  Procedure Laterality Date  . ABDOMINAL HYSTERECTOMY    . APPENDECTOMY    . CATARACT EXTRACTION W/ INTRAOCULAR LENS  IMPLANT, BILATERAL    . CHOLECYSTECTOMY    . DILATION AND CURETTAGE OF UTERUS    . MULTIPLE TOOTH EXTRACTIONS    . TOTAL HIP ARTHROPLASTY Right 08/29/2017   Procedure: TOTAL HIP ARTHROPLASTY ANTERIOR APPROACH;  Surgeon: Melrose Nakayama, MD;  Location: [facility];  Service: Orthopedics;  Laterality: Right;    Social History   Tobacco Use  . Smoking status: Never Smoker  . Smokeless tobacco: Never Used  Substance Use Topics  . Alcohol use: No    Family History  Problem Relation Age of Onset  . Diabetes Mother  ROS Per hpi  OBJECTIVE:  Blood pressure (!) 150/72, pulse 85, temperature 98.3 F (36.8 C), temperature source Oral, height 5' 0.63" (1.54 m), weight 198 lb 3.2 oz (89.9 kg), SpO2 98 %. Body mass index is 37.91 kg/m.   Physical Exam  Constitutional: She is oriented to person, place, and time. She appears well-developed and well-nourished.  HENT:  Head: Normocephalic and atraumatic.  Right Ear: Hearing, tympanic membrane, external ear and ear canal normal.  Left Ear: Hearing, tympanic membrane, external ear and ear canal normal.  Nose: Mucosal edema and rhinorrhea present.  Mouth/Throat: Oropharynx is clear and moist.  Clear PND seen   Eyes: Pupils are equal, round, and reactive to light. EOM  are normal.  Neck: Neck supple.  Cardiovascular: Normal rate, regular rhythm and normal heart sounds. Exam reveals no gallop and no friction rub.  No murmur heard. Pulmonary/Chest: Effort normal and breath sounds normal. She has no wheezes. She has no rales.  Lymphadenopathy:    She has no cervical adenopathy.  Neurological: She is alert and oriented to person, place, and time.  Skin: Skin is warm and dry.  Psychiatric: She has a normal mood and affect.  Nursing note and vitals reviewed.   No results found for this or any previous visit (from the past 24 hour(s)).  Dg Chest 2 View  Result Date: 03/07/2018 CLINICAL DATA:  Two weeks of productive cough.  Nonsmoker. EXAM: CHEST - 2 VIEW COMPARISON:  Chest x-ray of September 05, 2017 FINDINGS: The lungs are adequately inflated and clear. The heart and pulmonary vascularity are normal. The mediastinum is normal in width. There is no pleural effusion. There is mild multilevel degenerative disc disease of the thoracic spine. IMPRESSION: There is no active cardiopulmonary disease. Electronically Signed   By: David  Martinique M.D.   On: 03/07/2018 14:53     ASSESSMENT and PLAN  1. Vasomotor rhinitis Discussed supportive measures, new meds r/se/b and RTC precautions. Patient educational handout given. 2. Cough - DG Chest 2 View; Future  Other orders - benzonatate (TESSALON) 100 MG capsule; Take 1-2 capsules (100-200 mg total) by mouth 3 (three) times daily as needed for cough. - fluticasone (FLONASE) 50 MCG/ACT nasal spray; Place 1 spray into both nostrils 2 (two) times daily. - cetirizine (ZYRTEC) 5 MG tablet; Take 1 tablet (5 mg total) by mouth daily.   No follow-ups on file.    Rutherford Guys, MD [facility] [place], [place] [address] Ph.  [phone] Fax [phone]

## 2018-03-07 NOTE — Patient Instructions (Addendum)
Nonallergic Rhinitis Nonallergic rhinitis is a condition that causes symptoms that affect the nose, such as a runny nose and a stuffed-up nose (nasal congestion) that can make it hard to breathe through the nose. This condition is different from having an allergy (allergic rhinitis). Allergic rhinitis occurs when the body's defense system (immune system) reacts to a substance that you are allergic to (allergen), such as pollen, pet dander, mold, or dust. Nonallergic rhinitis has many similar symptoms, but it is not caused by allergens. Nonallergic rhinitis can be a short-term or long-term problem. What are the causes? This condition can be caused by many different things. Some common types of nonallergic rhinitis include: Infectious rhinitis  This is usually due to an infection in the upper respiratory tract. Vasomotor rhinitis  This is the most common type of long-term nonallergic rhinitis.  It is caused by too much blood flow through the nose, which makes the tissue inside of the nose swell.  Symptoms are often triggered by strong odors, cold air, stress, drinking alcohol, cigarette smoke, or changes in the weather. Occupational rhinitis  This type is caused by triggers in the workplace, such as chemicals, dusts, animal dander, or air pollution. Hormonal rhinitis  This type occurs in women as a result of an increase in the female hormone estrogen.  It may occur during pregnancy, puberty, and menstrual cycles.  Symptoms improve when estrogen levels drop. Drug-induced rhinitis Several drugs can cause nonallergic rhinitis, including:  Medicines that are used to treat high blood pressure, heart disease, and Parkinson disease.  Aspirin and NSAIDs.  Over-the-counter nasal decongestant sprays. These can cause a type of nonallergic rhinitis (rhinitis medicamentosa) when they are used for more than a few days.  Nonallergic rhinitis with eosinophilia syndrome (NARES)  This type is caused  by having too much of a certain type of white blood cell (eosinophil). Nonallergic rhinitis can also be caused by a reaction to eating hot or spicy foods. This does not usually cause long-term symptoms. In some cases, the cause of nonallergic rhinitis is not known. What increases the risk? You are more likely to develop this condition if:  You are 81-49 years of age.  You are a woman. Women are twice as likely to have this condition.  What are the signs or symptoms? Common symptoms of this condition include:  Nasal congestion.  Runny nose.  The feeling of mucus going down the back of the throat (postnasal drip).  Trouble sleeping at night and daytime sleepiness.  Less common symptoms include:  Sneezing.  Coughing.  Itchy nose.  Bloodshot eyes.  How is this diagnosed? This condition may be diagnosed based on:  Your symptoms and medical history.  A physical exam.  Allergy testing to rule out allergic rhinitis. You may have skin tests or blood tests.  In some cases, the health care provider may take a swab of nasal secretions to look for an increased number of eosinophils. This would be done to confirm a diagnosis of NARES. How is this treated? Treatment for this condition depends on the cause. No single treatment works for everyone. Work with your health care provider to find the best treatment for you. Treatment may include:  Avoiding the things that trigger your symptoms.  Using medicines to relieve congestion, such as: ? Steroid nasal spray. There are many types. You may need to try a few to find out which one works best. ? Decongestant medicine. This may be an oral medicine or a nasal spray. These  medicines are only used for a short time.  Using medicines to relieve a runny nose. These may include antihistamine medicines or anticholinergic nasal sprays.  Surgery to remove tissue from inside the nose may be needed in severe cases if the condition has not improved  after 6-12 months of medical treatment. Follow these instructions at home:  Take or use over-the-counter and prescription medicines only as told by your health care provider. Do not stop using your medicine even if you start to feel better.  Use salt-water (saline) rinses or other solutions (nasal washes or irrigations) to wash or rinse out the inside of your nose as told by your health care provider.  Do not take NSAIDs or medicines that contain aspirin if they make your symptoms worse.  Do not drink alcohol if it makes your symptoms worse.  Do not use any tobacco products, such as cigarettes, chewing tobacco, and e-cigarettes. If you need help quitting, ask your health care provider.  Avoid secondhand smoke.  Get some exercise every day. Exercise may help reduce symptoms of nonallergic rhinitis for some people. Ask your health care provider how much exercise and what types of exercise are safe for you.  Sleep with the head of your bed raised (elevated). This may reduce nighttime nasal congestion.  Keep all follow-up visits as told by your health care provider. This is important. Contact a health care provider if:  You have a fever.  Your symptoms are getting worse at home.  Your symptoms are not responding to medicine.  You develop new symptoms, especially a headache or nosebleed. This information is not intended to replace advice given to you by your health care provider. Make sure you discuss any questions you have with your health care provider. Document Released: 11/09/2015 Document Revised: 12/24/2015 Document Reviewed: 10/08/2015 Elsevier Interactive Patient Education  2018 [employer].     IF you received an x-ray today, you will receive an invoice from [facility]. Please contact [facility] at [phone] with questions or concerns regarding your invoice.   IF you received labwork today, you will receive an invoice from [facility]. Please contact  [facility] at [phone] with questions or concerns regarding your invoice.   Our billing staff will not be able to assist you with questions regarding bills from these companies.  You will be contacted with the lab results as soon as they are available. The fastest way to get your results is to activate your My Chart account. Instructions are located on the last page of this paperwork. If you have not heard from [place] regarding the results in 2 weeks, please contact this office.

## 2018-03-14 ENCOUNTER — Other Ambulatory Visit: Payer: Self-pay | Admitting: Family Medicine

## 2018-04-18 ENCOUNTER — Ambulatory Visit: Payer: Self-pay

## 2018-04-18 NOTE — Telephone Encounter (Signed)
Pt called with C/O dizziness x 2days. She says it is when standing . She holds on to things while walking. Pt is unsure why this is happening but states that she stopped her BP medication because her BP is always low. She has no means to check her BP for me. She states she can not check her pulse but says she has not notice a fast HR or fluttering feeling. She states she has been dieting and just has a poor appetite. She states she is drinking fluids as usual. No vomiting or diarrhea. She denies episodes of sudden confusion, slurred speech, and weakness on one side.  Care advice given and appointment made per protocol.  Reason for Disposition . [1] MODERATE dizziness (e.g., interferes with normal activities) AND [2] has NOT been evaluated by physician for this  (Exception: dizziness caused by heat exposure, sudden standing, or poor fluid intake)  Answer Assessment - Initial Assessment Questions 1. DESCRIPTION: "Describe your dizziness."     Comes and goes different times of the day 2. LIGHTHEADED: "Do you feel lightheaded?" (e.g., somewhat faint, woozy, weak upon standing)     When up moving around 3. VERTIGO: "Do you feel like either you or the room is spinning or tilting?" (i.e. vertigo)     no 4. SEVERITY: "How bad is it?"  "Do you feel like you are going to faint?" "Can you stand and walk?"   - MILD - walking normally   - MODERATE - interferes with normal activities (e.g., work, school)    - SEVERE - unable to stand, requires support to walk, feels like passing out now.      moderate 5. ONSET:  "When did the dizziness begin?"     2 days ago 6. AGGRAVATING FACTORS: "Does anything make it worse?" (e.g., standing, change in head position)     standing 7. HEART RATE: "Can you tell me your heart rate?" "How many beats in 15 seconds?"  (Note: not all patients can do this)       no 8. CAUSE: "What do you think is causing the dizziness?"     Not sure she has stopped taking BP medication 9.  RECURRENT SYMPTOM: "Have you had dizziness before?" If so, ask: "When was the last time?" "What happened that time?"     This morning about 45 minutes ago 10. OTHER SYMPTOMS: "Do you have any other symptoms?" (e.g., fever, chest pain, vomiting, diarrhea, bleeding)       No have been dieting not hungry 11. PREGNANCY: "Is there any chance you are pregnant?" "When was your last menstrual period?"       N/A  Protocols used: DIZZINESS [facility]

## 2018-04-19 ENCOUNTER — Other Ambulatory Visit: Payer: Self-pay

## 2018-04-19 ENCOUNTER — Encounter: Payer: Self-pay | Admitting: Physician Assistant

## 2018-04-19 ENCOUNTER — Ambulatory Visit (INDEPENDENT_AMBULATORY_CARE_PROVIDER_SITE_OTHER): Payer: PPO | Admitting: Physician Assistant

## 2018-04-19 VITALS — BP 129/81 | HR 77 | Temp 97.8°F | Resp 16 | Ht 60.5 in | Wt 192.0 lb

## 2018-04-19 DIAGNOSIS — R42 Dizziness and giddiness: Secondary | ICD-10-CM

## 2018-04-19 LAB — POCT CBC
Granulocyte percent: 62.5 %G (ref 37–80)
HCT, POC: 39.3 % (ref 37.7–47.9)
Hemoglobin: 13.5 g/dL (ref 12.2–16.2)
Lymph, poc: 1.8 (ref 0.6–3.4)
MCH, POC: 29.5 pg (ref 27–31.2)
MCHC: 34.4 g/dL (ref 31.8–35.4)
MCV: 86 fL (ref 80–97)
MID (cbc): 0.5 (ref 0–0.9)
MPV: 7.1 fL (ref 0–99.8)
POC Granulocyte: 3.8 (ref 2–6.9)
POC LYMPH PERCENT: 29 % (ref 10–50)
POC MID %: 8.5 % (ref 0–12)
Platelet Count, POC: 331 10*3/uL (ref 142–424)
RBC: 4.57 M/uL (ref 4.04–5.48)
RDW, POC: 15.3 %
WBC: 6.1 10*3/uL (ref 4.6–10.2)

## 2018-04-19 LAB — GLUCOSE, POCT (MANUAL RESULT ENTRY): POC Glucose: 108 mg/dl — AB (ref 70–99)

## 2018-04-19 LAB — POCT URINALYSIS DIP (MANUAL ENTRY)
Bilirubin, UA: NEGATIVE
Blood, UA: NEGATIVE
Glucose, UA: NEGATIVE mg/dL
Ketones, POC UA: NEGATIVE mg/dL
Nitrite, UA: NEGATIVE
Spec Grav, UA: 1.015 (ref 1.010–1.025)
Urobilinogen, UA: 1 U/dL
pH, UA: 6 (ref 5.0–8.0)

## 2018-04-19 NOTE — Patient Instructions (Addendum)
  You will receive a phone call to schedule an appointment with cardiology. Please be sure to answer your phone if it is an unknown number.  I will call you with the results of your urine test -- if you have a urinary tract infection I will call an antibiotic in to your pharmacy.   Your blood sugar is fine You are not anemic (low blood count)  Thank you for coming in today. I hope you feel we met your needs.  Feel free to call PCP if you have any questions or further requests.  Please consider signing up for MyChart if you do not already have it, as this is a great way to communicate with me.  Best,  Whitney McVey, PA-C  IF you received an x-ray today, you will receive an invoice from [facility]. Please contact [facility] at [phone] with questions or concerns regarding your invoice.   IF you received labwork today, you will receive an invoice from [facility]. Please contact [facility] at [phone] with questions or concerns regarding your invoice.   Our billing staff will not be able to assist you with questions regarding bills from these companies.  You will be contacted with the lab results as soon as they are available. The fastest way to get your results is to activate your My Chart account. Instructions are located on the last page of this paperwork. If you have not heard from [place] regarding the results in 2 weeks, please contact this office.

## 2018-04-19 NOTE — Progress Notes (Signed)
Barbara Soto  MRN: [id number] DOB: [birth year]  PCP: Patient, No Pcp Per  Subjective:  Pt is a 77 year old female who presents to clinic for episodic dizziness x 2 days. PCP is Dr. Pamella Pert.   Symptoms are intermittent, has had about 4 episodes over the past 2 days.  Episodes happened when: She got up to go to the bathroom and felt like the room was spinning.  She was walking in her kitchen - had not stood up from seated position. Other episodes happen when she is already up walking around.  "feels like I'm going to faint". Denies sensation of room spinning She sits down to rest when episodes happen. Episodes last a few minutes and go away on their own.   Denies urinary symptoms, fever, chills, confusion,chest pain, palpitations, syncope, chest pain, shob, DOE, HA, n/v, muscle weakness.   She is currently asymptomatic. Feeling well today  Daily Medications:  low dose ASA and nasal spray No new medications. She has been using stool softeners over the past 2-3 weeks.  Seen cards about 20 years ago.   No alcohol Non smoker No opioids No recent illness  Diet: slimfast for breakfast, salad, [place] and ham sandwich with a teaspoon of mayonase, 15 non-fat chips, splenda tea, slimfast at dinner She does not drink water. "I never have"  Lab Results  Component Value Date   CHOL 143 10/19/2017   Lab Results  Component Value Date   HDL 57 10/19/2017   Lab Results  Component Value Date   LDLCALC 57 10/19/2017   Lab Results  Component Value Date   TRIG 145 10/19/2017   Lab Results  Component Value Date   CHOLHDL 2.5 10/19/2017   No results found for: LDLDIRECT  Review of Systems  Constitutional: Negative for diaphoresis and fatigue.  Respiratory: Negative for cough, chest tightness and shortness of breath.   Cardiovascular: Negative for chest pain, palpitations and leg swelling.  Gastrointestinal: Negative for nausea and vomiting.  Neurological: Positive for  dizziness and light-headedness. Negative for syncope and headaches.  Psychiatric/Behavioral: Negative for agitation, confusion and dysphoric mood. The patient is not nervous/anxious.     Patient Active Problem List   Diagnosis Date Noted  . Status post right hip replacement 10/17/2017  . Nausea and vomiting 09/05/2017  . Anemia 09/05/2017  . Acute blood loss as cause of postoperative anemia 09/01/2017  . Primary osteoarthritis of right hip 08/29/2017  . Essential hypertension 07/22/2009  . GALLBLADDER DISEASE 07/22/2009  . ARTHRITIS 07/22/2009    Current Outpatient Medications on File Prior to Visit  Medication Sig Dispense Refill  . aspirin 81 MG tablet Take 1 tablet (81 mg total) by mouth daily. 30 tablet 11  . fluticasone (FLONASE) 50 MCG/ACT nasal spray Place 1 spray into both nostrils 2 (two) times daily. 16 g 6  . cetirizine (ZYRTEC) 5 MG tablet Take 1 tablet (5 mg total) by mouth daily. (Patient not taking: Reported on 04/19/2018) 30 tablet 11  . HYDROcodone-acetaminophen (NORCO/VICODIN) 5-325 MG tablet Take 1 tablet by mouth every 6 (six) hours as needed. (Patient not taking: Reported on 04/19/2018) 20 tablet 0   No current facility-administered medications on file prior to visit.     Allergies  Allergen Reactions  . Adhesive [Tape] Other (See Comments)    Takes skin off   . Penicillins Rash and Other (See Comments)    PATIENT HAS HAD A PCN REACTION WITH IMMEDIATE RASH, FACIAL/TONGUE/THROAT SWELLING, SOB, OR LIGHTHEADEDNESS WITH HYPOTENSION:  #  #  #  YES  #  #  #   Has patient had a PCN reaction causing severe rash involving mucus membranes or skin necrosis: No Has patient had a PCN reaction that required hospitalization: No Has patient had a PCN reaction occurring within the last 10 years: No      Objective:  BP 129/81 (BP Location: Right Arm, Patient Position: Sitting, Cuff Size: Normal)   Pulse 77   Temp 97.8 F (36.6 C) (Oral)   Resp 16   Ht 5' 0.5" (1.537 m)    Wt 192 lb (87.1 kg)   SpO2 98%   BMI 36.88 kg/m  Orthostatic VS for the past 24 hrs:  BP- Lying Pulse- Lying BP- Standing at 0 minutes Pulse- Standing at 0 minutes  04/19/18 0818 (!) 178/98 71 114/76 74    Physical Exam  Constitutional: She is oriented to person, place, and time. No distress.  Eyes: Pupils are equal, round, and reactive to light. Conjunctivae and EOM are normal.  Cardiovascular: Normal rate, normal heart sounds, intact distal pulses and normal pulses. An irregular rhythm present.  Neurological: She is alert and oriented to person, place, and time.  Skin: Skin is warm and dry.  Psychiatric: Judgment normal.  Vitals reviewed.  The 10-year ASCVD risk score Mikey Bussing DC Brooke Bonito., et al., 2013) is: 15.8%   Values used to calculate the score:     Age: 28 years     Sex: Female     Is Non-Hispanic African American: No     Diabetic: No     Tobacco smoker: No     Systolic Blood Pressure: 347 mmHg     Is BP treated: No     HDL Cholesterol: 57 mg/dL     Total Cholesterol: 143 mg/dL  Results for orders placed or performed in visit on 04/19/18  POCT CBC  Result Value Ref Range   WBC 6.1 4.6 - 10.2 K/uL   Lymph, poc 1.8 0.6 - 3.4   POC LYMPH PERCENT 29.0 10 - 50 %L   MID (cbc) 0.5 0 - 0.9   POC MID % 8.5 0 - 12 %M   POC Granulocyte 3.8 2 - 6.9   Granulocyte percent 62.5 37 - 80 %G   RBC 4.57 4.04 - 5.48 M/uL   Hemoglobin 13.5 12.2 - 16.2 g/dL   HCT, POC 39.3 37.7 - 47.9 %   MCV 86.0 80 - 97 fL   MCH, POC 29.5 27 - 31.2 pg   MCHC 34.4 31.8 - 35.4 g/dL   RDW, POC 15.3 %   Platelet Count, POC 331 142 - 424 K/uL   MPV 7.1 0 - 99.8 fL  POCT urinalysis dipstick  Result Value Ref Range   Color, UA yellow yellow   Clarity, UA clear clear   Glucose, UA negative negative mg/dL   Bilirubin, UA negative negative   Ketones, POC UA negative negative mg/dL   Spec Grav, UA 1.015 1.010 - 1.025   Blood, UA negative negative   pH, UA 6.0 5.0 - 8.0   Protein Ur, POC trace (A) negative  mg/dL   Urobilinogen, UA 1.0 0.2 or 1.0 E.U./dL   Nitrite, UA Negative Negative   Leukocytes, UA Large (3+) (A) Negative  POCT glucose (manual entry)  Result Value Ref Range   POC Glucose 108 (A) 70 - 99 mg/dl    Assessment and Plan :  1. Episode of dizziness - Orthostatic vital signs - POCT CBC - POCT urinalysis dipstick - Urine  Culture - EKG 12-Lead - POCT glucose (manual entry) - Ambulatory referral to Cardiology - Pt presents c/o intermittent episodes of dizziness over the past 2 days. She is asymptomatic today. CBC and glucose wnl. Vitals are stable, however she is orthostatic. Irregular rhythm on PE. EKG machine is down today. Discussed urgent cardiology referral and she declines this. Dicussed risks of worsening symptoms. She agrees to referral however states she will not go if she does not have symptoms in the next few days. Advised her tof/u with her PCP.   Mercer Pod, PA-C  [facility] [place] 04/19/2018 8:21 AM  Please note: Portions of this report may have been transcribed using dragon voice recognition software. Every effort was made to ensure accuracy; however, inadvertent computerized transcription errors may be present.

## 2018-04-23 ENCOUNTER — Other Ambulatory Visit: Payer: Self-pay | Admitting: Physician Assistant

## 2018-04-23 DIAGNOSIS — N3000 Acute cystitis without hematuria: Secondary | ICD-10-CM

## 2018-04-23 LAB — URINE CULTURE

## 2018-04-23 MED ORDER — SULFAMETHOXAZOLE-TRIMETHOPRIM 800-160 MG PO TABS: 1.0000 | ORAL_TABLET | Freq: Two times a day (BID) | ORAL | 0 refills | Status: DC

## 2018-04-23 NOTE — Progress Notes (Signed)
Please call pt and let her know her urine culture grew a bacteria. I sent in a prescription for antibiotic to her pharmacy. Please start this at her earliest convenience. Thank you!

## 2018-05-14 ENCOUNTER — Telehealth: Payer: Self-pay | Admitting: Family Medicine

## 2018-05-14 DIAGNOSIS — M25551 Pain in right hip: Principal | ICD-10-CM

## 2018-05-14 DIAGNOSIS — Z96641 Presence of right artificial hip joint: Secondary | ICD-10-CM

## 2018-05-14 DIAGNOSIS — G8929 Other chronic pain: Secondary | ICD-10-CM

## 2018-05-14 NOTE — Telephone Encounter (Signed)
Copied from [place] [id number]. Topic: General - Other >> May 14, 2018 10:47 AM Nils Flack wrote: Reason for CRM: pt fell on hip a couple of months back, had told Dr Pamella Pert about it/ She says it is not getting any better and would like to request xray to be done a  imaging near [employer] Please call [phone]

## 2018-05-14 NOTE — Telephone Encounter (Signed)
Please advise 

## 2018-05-15 NOTE — Telephone Encounter (Signed)
Patient called to request referral sent to [facility] imaging but not the one on [address]. She stated that she fell a while ago and need to have her hip checked. Ph# [phone]

## 2018-05-15 NOTE — Telephone Encounter (Signed)
Please adv

## 2018-05-16 NOTE — Telephone Encounter (Signed)
Please let patient know that I sent an xray order for her RIGHT hip to GI on [facility], that is the one that I can tell is closest to [facility]. Both locations are on [address]. thanks

## 2018-05-17 ENCOUNTER — Ambulatory Visit
Admission: RE | Admit: 2018-05-17 | Discharge: 2018-05-17 | Disposition: A | Discharge status: Home / Self Care | Payer: PPO | Source: Ambulatory Visit | Attending: Family Medicine | Admitting: Family Medicine

## 2018-05-17 DIAGNOSIS — G8929 Other chronic pain: Secondary | ICD-10-CM

## 2018-05-17 DIAGNOSIS — S79911A Unspecified injury of right hip, initial encounter: Secondary | ICD-10-CM | POA: Diagnosis not present

## 2018-05-17 DIAGNOSIS — M25551 Pain in right hip: Secondary | ICD-10-CM | POA: Diagnosis not present

## 2018-05-17 DIAGNOSIS — Z96641 Presence of right artificial hip joint: Secondary | ICD-10-CM

## 2018-05-17 NOTE — Telephone Encounter (Signed)
Spoke with pt via phone advised xray order sent for her right hip to GI on [address] and this is the closest one to [facility].  Pt agreeable and appreciative of call back. Dgaddy, CMA

## 2018-07-02 DIAGNOSIS — L820 Inflamed seborrheic keratosis: Secondary | ICD-10-CM | POA: Diagnosis not present

## 2018-07-02 DIAGNOSIS — B078 Other viral warts: Secondary | ICD-10-CM | POA: Diagnosis not present

## 2018-07-02 DIAGNOSIS — D485 Neoplasm of uncertain behavior of skin: Secondary | ICD-10-CM | POA: Diagnosis not present

## 2018-07-12 ENCOUNTER — Other Ambulatory Visit: Payer: Self-pay

## 2018-07-12 ENCOUNTER — Ambulatory Visit (INDEPENDENT_AMBULATORY_CARE_PROVIDER_SITE_OTHER): Payer: PPO | Admitting: Physician Assistant

## 2018-07-12 ENCOUNTER — Encounter: Payer: Self-pay | Admitting: Physician Assistant

## 2018-07-12 VITALS — BP 132/84 | HR 81 | Temp 97.8°F | Ht 60.5 in | Wt 203.6 lb

## 2018-07-12 DIAGNOSIS — J22 Unspecified acute lower respiratory infection: Secondary | ICD-10-CM | POA: Diagnosis not present

## 2018-07-12 DIAGNOSIS — R05 Cough: Secondary | ICD-10-CM | POA: Diagnosis not present

## 2018-07-12 DIAGNOSIS — R059 Cough, unspecified: Secondary | ICD-10-CM

## 2018-07-12 MED ORDER — HYDROCOD POLST-CPM POLST ER 10-8 MG/5ML PO SUER: 2.5000 mL | Freq: Two times a day (BID) | ORAL | 0 refills | Status: DC | PRN

## 2018-07-12 MED ORDER — AZITHROMYCIN 250 MG PO TABS: ORAL_TABLET | ORAL | 0 refills | Status: DC

## 2018-07-12 MED ORDER — BENZONATATE 100 MG PO CAPS: 100.0000 mg | ORAL_CAPSULE | Freq: Three times a day (TID) | ORAL | 0 refills | Status: DC | PRN

## 2018-07-12 NOTE — Patient Instructions (Addendum)
Azithromycin is an antibiotic. Take the entire course of this medication, even if you start to feel better sooner.  Tessalon is for cough during the day. This should not make you drowsy.  Tussionex is for cough at night. This will make you drowsy. Please read below for more information regarding side effects.   Stay well hydrated. Get lost of rest. Wash your hands often.   -Foods that can help speed recovery: honey, garlic, chicken soup, elderberries, green tea.  -Supplements that can help speed recovery: vitamin C, zinc, elderberry extract, quercetin, ginseng, selenium -Supplement with prebiotics and probiotics:   Advil or ibuprofen for pain. Do not take Aspirin.  Drink enough water and fluids to keep your urine clear or pale yellow.  For sore throat: ? Gargle with 8 oz of salt water ( tsp of salt per 1 qt of water) as often as every 1-2 hours to soothe your throat.  Gargle liquid benadryl.  Cepacol throat lozenges (if you are not at risk for choking).  For sore throat try using a honey-based tea. Use 3 teaspoons of honey with juice squeezed from half lemon. Place shaved pieces of ginger into 1/2-1 cup of water and warm over stove top. Then mix the ingredients and repeat every 4 hours as needed.  Cough Syrup Recipe: Sweet Lemon & Honey Thyme  Ingredients . a handful of fresh thyme sprigs   . 1 pint of water (2 cups)  . 1/2 cup honey (raw is best, but regular will do)  . 1/2 lemon chopped Instructions 1. Place the lemon in the pint jar and cover with the honey. The honey will macerate the lemons and draw out liquids which taste so delicious! 2. Meanwhile, toss the thyme leaves into a saucepan and cover them with the water. 3. Bring the water to a gentle simmer and reduce it to half, about a cup of tea. 4. When the tea is reduced and cooled a bit, strain the sprigs & leaves, add it into the pint jar and stir it well. 5. Give it a shake and use a spoonful as needed. 6. Store your  homemade cough syrup in the refrigerator for about a month.  What causes a cough?  In adults, common causes of a cough include: ?An infection of the airways or lungs (such as the common cold) ?Postnasal drip - Postnasal drip is when mucus from the nose drips down or flows along the back of the throat. Postnasal drip can happen when people have: .A cold .Allergies .A sinus infection - The sinuses are hollow areas in the bones of the face that open into the nose. ?Lung conditions, like asthma and chronic obstructive pulmonary disease (COPD) - Both of these conditions can make it hard to breathe. COPD is usually caused by smoking. ?Acid reflux - Acid reflux is when the acid that is normally in your stomach backs up into your esophagus (the tube that carries food from your mouth to your stomach). ?A side effect from blood pressure medicines called "ACE inhibitors" ?Smoking cigarettes  Is there anything I can do on my own to get rid of my cough?  Yes. To help get rid of your cough, you can: ?Use a humidifier in your bedroom ?Use an over-the-counter cough medicine, or suck on cough drops or hard candy ?Stop smoking, if you smoke ?If you have allergies, avoid the things you are allergic to (like pollen, dust, animals, or mold) If you have acid reflux, your doctor or nurse will  tell you which lifestyle changes can help reduce symptoms.   Chlorpheniramine; Hydrocodone oral solution or suspension What is this medicine? CHLORPHENIRAMINE; HYDROCODONE (klor fen IR a meen; hye droe KOE done) is a combination of an antihistamine and cough suppressant. It is used to treat the symptoms of allergies and colds. This medicine may be used for other purposes; ask your health care provider or pharmacist if you have questions. COMMON BRAND NAME(S): HyTan, Novasus, S-T Forte 2, Tussionex, VITUZ What should I tell my health care provider before I take this medicine? They need to know if you have any of these  conditions: -diabetes -drug abuse or addiction -head injury -heart disease -kidney disease -liver disease -lung or breathing disease, like asthma -problems urinating -seizures -stomach or intestine problems -an unusual or allergic reaction to chlorpheniramine, hydrocodone, other medicines, foods, dyes, or preservatives -pregnant or trying to get pregnant -breast-feeding How should I use this medicine? Take this medicine by mouth. Follow the directions on the prescription label. Shake well before using. Use a specially marked spoon or container to measure each dose. Household spoons are not accurate. You can take it with or without food. If it upsets your stomach, take it with food. Take your medicine at regular intervals. Do not take it more often than directed. A special MedGuide will be given to you by the pharmacist with each prescription and refill. Be sure to read this information carefully each time. Talk to your pediatrician regarding the use of this medicine in children. This medicine is not approved for use in children. Overdosage: If you think you have taken too much of this medicine contact a poison control center or emergency room at once. NOTE: This medicine is only for you. Do not share this medicine with others. What if I miss a dose? If you miss a dose, take it as soon as you can. If it is almost time for your next dose, take only that dose. Do not take double or extra doses. What may interact with this medicine? Do not take this medicine with any of the following medications: -alcohol -certain medicines for anxiety or sleep -certain medicines for depression like amitriptyline, fluoxetine, sertraline -certain medicines for seizures like phenobarbital, primidone -general anesthetics like halothane, isoflurane, methoxyflurane, propofol -local anesthetics like lidocaine, pramoxine, tetracaine -MAOIs like Carbex, Eldepryl, Nardil, and Parnate -medicines that relax muscles  for surgery -other antihistamines for allergy, cough and cold -other narcotic medicines for pain or cough -phenothiazines like chlorpromazine, mesoridazine, prochlorperazine, thioridazine This medicine may also interact with the following medications: -antiviral medicines for HIV or AIDS -atropine -certain antibiotics like clarithromycin, erythromycin -certain medicines for bladder problems like oxybutynin, tolterodine -certain medicines for fungal infections like ketoconazole and itraconazole -certain medicines for Parkinson's disease like benztropine, trihexyphenidyl -certain medicines for stomach problems like dicyclomine, hyoscyamine -certain medicines for travel sickness like scopolamine -ipratropium -rifampin This list may not describe all possible interactions. Give your health care provider a list of all the medicines, herbs, non-prescription drugs, or dietary supplements you use. Also tell them if you smoke, drink alcohol, or use illegal drugs. Some items may interact with your medicine. What should I watch for while using this medicine? Use exactly as directed by your doctor or health care professional. Do not take more than the recommended dose. You may develop tolerance to this medicine if you take it for a long time. Tolerance means that you will get less cough relief with time. Tell your doctor or health care professional if your  symptoms do not improve or if they get worse. If you have been taking this medicine for a long time, do not suddenly stop taking it because you may develop a severe reaction. Your body becomes used to the medicine. This does NOT mean you are addicted. Addiction is a behavior related to getting and using a drug for a nonmedical reason. If your doctor wants you to stop the medicine, the dose will be slowly lowered over time to avoid any side effects. There are different types of narcotic medicines (opiates). If you take more than one type at the same time or  if you are taking another medicine that also causes drowsiness, you may have more side effects. Give your health care provider a list of all medicines you use. Your doctor will tell you how much medicine to take. Do not take more medicine than directed. Call emergency for help if you have problems breathing or unusual sleepiness. You may get drowsy or dizzy. Do not drive, use machinery, or do anything that needs mental alertness until you know how this medicine affects you. Do not stand or sit up quickly, especially if you are an older patient. This reduces the risk of dizzy or fainting spells. Alcohol may interfere with the effect of this medicine. Avoid alcoholic drinks. The medicine will cause constipation. Try to have a bowel movement at least every 2 to 3 days. If you do not have a bowel movement for 3 days, call your doctor or health care professional. Your mouth may get dry. Chewing sugarless gum or sucking hard candy, and drinking plenty of water may help. Contact your doctor if the problem does not go away or is severe. This medicine may cause dry eyes and blurred vision. If you wear contact lenses, you may feel some discomfort. Lubricating drops may help. See your eye doctor if the problem does not go away or is severe. What side effects may I notice from receiving this medicine? Side effects that you should report to your doctor or health care professional as soon as possible: -allergic reactions like skin rash, itching or hives, swelling of the face, lips, or tongue -breathing problems -confusion -signs and symptoms of low blood pressure like dizziness; feeling faint or lightheaded, falls; unusually weak or tired -trouble passing urine or change in the amount of urine Side effects that usually do not require medical attention (report to your doctor or health care professional if they continue or are bothersome): -constipation -dry mouth -nausea, vomiting -tiredness This list may not  describe all possible side effects. Call your doctor for medical advice about side effects. You may report side effects to FDA at 1-800-FDA-1088. Where should I keep my medicine? Keep out of the reach of children. This medicine can be abused. Keep this medicine in a safe place to protect it from theft. Do not share this medicine with anyone. Selling or giving away this medicine is dangerous and is against the law. This medicine may cause accidental overdose and death if taken by other adults, children, or pets. Mix any unused medicine with a substance like cat littler or coffee grounds. Then throw the medicine away in a sealed container like a sealed bag or a coffee can with a lid. Do not use the medicine after the expiration date. Store at room temperature between 15 and 30 degrees C (59 and 86 degrees F). Do not freeze. Keep container tightly closed. NOTE: This sheet is a summary. It may not cover all possible  information. If you have questions about this medicine, talk to your doctor, pharmacist, or health care provider.  2018 Elsevier/Gold Standard (2016-08-11 22:55:41)

## 2018-07-12 NOTE — Progress Notes (Signed)
Barbara Soto  MRN: [id number] DOB: 1941/05/14  PCP: Patient, No Pcp Per  Subjective:  Pt is a 77 year old female who presents to clinic for cough x 5 days. Cough is worsening.  Cough is productive with dark green mucus. Fever yesterday 102.  Occasional sharp pain of right ear.  She has tried Robitussen.   Pt  has a past medical history of Complication of anesthesia, DJD (degenerative joint disease), Hypertension, PONV (postoperative nausea and vomiting), Wears dentures, and Wears glasses.  Review of Systems  Constitutional: Positive for fever. Negative for chills, diaphoresis and fatigue.  HENT: Negative for congestion, ear pain, facial swelling, postnasal drip, rhinorrhea, sinus pressure, sinus pain and sore throat.   Respiratory: Positive for cough. Negative for shortness of breath and wheezing.   Psychiatric/Behavioral: Negative for sleep disturbance.    Patient Active Problem List   Diagnosis Date Noted  . Status post right hip replacement 10/17/2017  . Nausea and vomiting 09/05/2017  . Anemia 09/05/2017  . Acute blood loss as cause of postoperative anemia 09/01/2017  . Primary osteoarthritis of right hip 08/29/2017  . Essential hypertension 07/22/2009  . GALLBLADDER DISEASE 07/22/2009  . ARTHRITIS 07/22/2009    Current Outpatient Medications on File Prior to Visit  Medication Sig Dispense Refill  . aspirin 81 MG tablet Take 1 tablet (81 mg total) by mouth daily. 30 tablet 11   No current facility-administered medications on file prior to visit.     Allergies  Allergen Reactions  . Adhesive [Tape] Other (See Comments)    Takes skin off   . Penicillins Rash and Other (See Comments)    PATIENT HAS HAD A PCN REACTION WITH IMMEDIATE RASH, FACIAL/TONGUE/THROAT SWELLING, SOB, OR LIGHTHEADEDNESS WITH HYPOTENSION:  #  #  #  YES  #  #  #   Has patient had a PCN reaction causing severe rash involving mucus membranes or skin necrosis: No Has patient had a PCN reaction  that required hospitalization: No Has patient had a PCN reaction occurring within the last 10 years: No   . Latex Other (See Comments)    unlisted     Objective:  BP 132/84 (BP Location: Left Arm, Patient Position: Sitting, Cuff Size: Large)   Pulse 81   Temp 97.8 F (36.6 C) (Oral)   Ht 5' 0.5" (1.537 m)   Wt 203 lb 9.6 oz (92.4 kg)   SpO2 96%   BMI 39.11 kg/m   Physical Exam Vitals signs reviewed.  Constitutional:      General: She is not in acute distress. HENT:     Right Ear: Tympanic membrane is erythematous and bulging.     Left Ear: Tympanic membrane normal.     Nose: Mucosal edema present. No rhinorrhea.     Right Sinus: No maxillary sinus tenderness or frontal sinus tenderness.     Left Sinus: No maxillary sinus tenderness or frontal sinus tenderness.  Cardiovascular:     Rate and Rhythm: Normal rate and regular rhythm.     Heart sounds: Normal heart sounds.  Pulmonary:     Effort: Pulmonary effort is normal. No respiratory distress.     Breath sounds: Normal breath sounds. No decreased breath sounds, wheezing, rhonchi or rales.  Skin:    General: Skin is warm and dry.  Neurological:     Mental Status: She is alert and oriented to person, place, and time.  Psychiatric:        Judgment: Judgment normal.  Assessment and Plan :  1. Lower respiratory infection 2. Cough - benzonatate (TESSALON) 100 MG capsule; Take 1-2 capsules (100-200 mg total) by mouth 3 (three) times daily as needed for cough.  Dispense: 40 capsule; Refill: 0 - chlorpheniramine-HYDROcodone (TUSSIONEX PENNKINETIC ER) 10-8 MG/5ML SUER; Take 2.5 mLs by mouth every 12 (twelve) hours as needed for cough.  Dispense: 100 mL; Refill: 0 - azithromycin (ZITHROMAX) 250 MG tablet; Take 2 tabs PO x 1 dose, then 1 tab PO QD x 4 days  Dispense: 6 tablet; Refill: 0 - Worsening cough with fever. Plan to cover with zithromax. Encouraged hydration. RTC if no improvement.    Mercer Pod, PA-C  [facility] [place] 07/12/2018 3:08 PM  Please note: Portions of this report may have been transcribed using dragon voice recognition software. Every effort was made to ensure accuracy; however, inadvertent computerized transcription errors may be present.

## 2018-07-20 ENCOUNTER — Ambulatory Visit (INDEPENDENT_AMBULATORY_CARE_PROVIDER_SITE_OTHER): Payer: PPO | Admitting: Family Medicine

## 2018-07-20 ENCOUNTER — Ambulatory Visit
Admission: RE | Admit: 2018-07-20 | Discharge: 2018-07-20 | Disposition: A | Discharge status: Home / Self Care | Payer: PPO | Source: Ambulatory Visit | Attending: Family Medicine | Admitting: Family Medicine

## 2018-07-20 ENCOUNTER — Encounter: Payer: Self-pay | Admitting: Family Medicine

## 2018-07-20 VITALS — BP 147/77 | HR 73 | Temp 97.8°F | Resp 18 | Ht 60.5 in | Wt 202.8 lb

## 2018-07-20 DIAGNOSIS — M25552 Pain in left hip: Secondary | ICD-10-CM

## 2018-07-20 DIAGNOSIS — M25562 Pain in left knee: Secondary | ICD-10-CM

## 2018-07-20 DIAGNOSIS — S8992XA Unspecified injury of left lower leg, initial encounter: Secondary | ICD-10-CM | POA: Diagnosis not present

## 2018-07-20 DIAGNOSIS — S79912A Unspecified injury of left hip, initial encounter: Secondary | ICD-10-CM | POA: Diagnosis not present

## 2018-07-20 DIAGNOSIS — M7989 Other specified soft tissue disorders: Secondary | ICD-10-CM | POA: Diagnosis not present

## 2018-07-20 MED ORDER — HYDROCODONE-ACETAMINOPHEN 5-325 MG PO TABS: 1.0000 | ORAL_TABLET | Freq: Four times a day (QID) | ORAL | 0 refills | Status: DC | PRN

## 2018-07-20 NOTE — Progress Notes (Signed)
12/20/20193:28 PM  Barbara Soto 04-13-1941, 77 y.o. female [id number]  Chief Complaint  Patient presents with  . Fall    following up on fall; states both her legs are swollen  . glass in foot    states she has a piece of glass in her left foot and she can't get it out    HPI:   Patient is a 77 y.o. female with past medical history significant for DJD of knee and hip and HTN who presents today for followup on fall  Was going up her porch 4 days ago And her left knee gave out landing on that leg She reports knee twisted Was doing ok but then 2 days later started having swelling and hurting from her distal thigh down her leg and lateral knee pain She also feel like she has a piece of glass on the bottom of her left foot She has been needing her cane since her fall   Fall Risk  07/20/2018 07/12/2018 04/19/2018 03/07/2018 02/19/2018  Falls in the past year? 1 1 No No No  Number falls in past yr: 0 0 - - -  Injury with Fall? 1 1 - - -     Depression screen [facility] 2/9 07/12/2018 04/19/2018 03/07/2018  Decreased Interest 0 0 0  Down, Depressed, Hopeless 0 0 0  PHQ - 2 Score 0 0 0    Allergies  Allergen Reactions  . Adhesive [Tape] Other (See Comments)    Takes skin off   . Penicillins Rash and Other (See Comments)    PATIENT HAS HAD A PCN REACTION WITH IMMEDIATE RASH, FACIAL/TONGUE/THROAT SWELLING, SOB, OR LIGHTHEADEDNESS WITH HYPOTENSION:  #  #  #  YES  #  #  #   Has patient had a PCN reaction causing severe rash involving mucus membranes or skin necrosis: No Has patient had a PCN reaction that required hospitalization: No Has patient had a PCN reaction occurring within the last 10 years: No   . Latex Other (See Comments)    unlisted    Prior to Admission medications   Medication Sig Start Date End Date Taking? Authorizing Provider  aspirin 81 MG tablet Take 1 tablet (81 mg total) by mouth daily. 10/19/17   Rutherford Guys, MD    Past Medical History:  Diagnosis Date    . Complication of anesthesia   . DJD (degenerative joint disease)    right hip  . Hypertension   . PONV (postoperative nausea and vomiting)   . Wears dentures   . Wears glasses     Past Surgical History:  Procedure Laterality Date  . ABDOMINAL HYSTERECTOMY    . APPENDECTOMY    . CATARACT EXTRACTION W/ INTRAOCULAR LENS  IMPLANT, BILATERAL    . CHOLECYSTECTOMY    . DILATION AND CURETTAGE OF UTERUS    . MULTIPLE TOOTH EXTRACTIONS    . TOTAL HIP ARTHROPLASTY Right 08/29/2017   Procedure: TOTAL HIP ARTHROPLASTY ANTERIOR APPROACH;  Surgeon: Melrose Nakayama, MD;  Location: [facility];  Service: Orthopedics;  Laterality: Right;    Social History   Tobacco Use  . Smoking status: Never Smoker  . Smokeless tobacco: Never Used  Substance Use Topics  . Alcohol use: No    Family History  Problem Relation Age of Onset  . Diabetes Mother     ROS Per hpi  OBJECTIVE:  Blood pressure (!) 147/77, pulse 73, temperature 97.8 F (36.6 C), temperature source Oral, resp. rate 18, height 5' 0.5" (  1.537 m), weight 202 lb 12.8 oz (92 kg), SpO2 98 %. Body mass index is 38.95 kg/m.   BP Readings from Last 3 Encounters:  07/20/18 (!) 147/77  07/12/18 132/84  04/19/18 129/81    Physical Exam  Gen: aaox3, nad, in obvious pain when she moves MSK:  Left hip: FROM, TTP along GT and lateral aspect, no swelling or bruising Left Knee: FROM, no swelling or erythema,TTP over lateral joint line, no ligament laxity, + mcmurry lateral  Left foot: sole distal heel surface with a very small chard of glass. Area cleansed with betadine, chard easily removed with sterile needle. Bandaid applied.    Dg Knee Complete 4 Views Left  Result Date: 07/20/2018 CLINICAL DATA:  Lateral knee pain and swelling after falling 4 days ago EXAM: LEFT KNEE - COMPLETE 4+ VIEW COMPARISON:  LEFT femoral radiographs 03/21/2013 FINDINGS: Osseous demineralization. Minimal diffuse joint space narrowing. No acute fracture,  dislocation, or bone destruction. No knee joint effusion. IMPRESSION: No acute osseous abnormalities. Osseous demineralization with minimal degenerative changes. Electronically Signed   By: Lavonia Dana M.D.   On: 07/20/2018 17:01   Dg Hip Unilat W Or W/o Pelvis 2-3 Views Left  Result Date: 07/20/2018 CLINICAL DATA:  LEFT hip pain after falling 4 days ago EXAM: DG HIP (WITH OR WITHOUT PELVIS) 2-3V LEFT COMPARISON:  None FINDINGS: Osseous demineralization. Narrowing of the LEFT hip joint. RIGHT hip prosthesis identified. SI joints preserved. No acute fracture, dislocation, or bone destruction. Mild degenerative disc disease changes at visualized lower lumbar spine. IMPRESSION: Osseous demineralization with degenerative changes of the LEFT hip joint. No acute abnormalities. Electronically Signed   By: Lavonia Dana M.D.   On: 07/20/2018 17:00     ASSESSMENT and PLAN  1. Acute hip pain, left Xray normal. Cont with supportive measures. Ice, rest  - DG HIP UNILAT W OR W/O PELVIS 2-3 VIEWS LEFT; Future  2. Acute pain of left knee Exam suggestive of meniscal involvement, placed in brace. Referred to ortho, vicodin as needed - DG Knee Complete 4 Views Left; Future - Ambulatory referral to Orthopedic Surgery - Apply knee sleeve - off loding knee brace  Blood pressure elevated, patient in obvious pain, previous no issues. Recheck at next visit  Other orders - HYDROcodone-acetaminophen (NORCO/VICODIN) 5-325 MG tablet; Take 1 tablet by mouth every 6 (six) hours as needed for moderate pain.  Return if symptoms worsen or fail to improve.    Rutherford Guys, MD [facility] at [facility] [place], [place] [address] Ph.  [phone] Fax [phone]

## 2018-07-20 NOTE — Patient Instructions (Signed)
° ° ° °  If you have lab work done today you will be contacted with your lab results within the next 2 weeks.  If you have not heard from us then please contact us. The fastest way to get your results is to register for My Chart. ° ° °IF you received an x-ray today, you will receive an invoice from [facility]. Please contact [facility] at [phone] with questions or concerns regarding your invoice.  ° °IF you received labwork today, you will receive an invoice from [facility]. Please contact [facility] at [phone] with questions or concerns regarding your invoice.  ° °Our billing staff will not be able to assist you with questions regarding bills from these companies. ° °You will be contacted with the lab results as soon as they are available. The fastest way to get your results is to activate your My Chart account. Instructions are located on the last page of this paperwork. If you have not heard from us regarding the results in 2 weeks, please contact this office. °  ° ° ° °

## 2018-09-04 ENCOUNTER — Other Ambulatory Visit: Payer: Self-pay | Admitting: Oncology

## 2018-09-20 DIAGNOSIS — M25562 Pain in left knee: Secondary | ICD-10-CM | POA: Diagnosis not present

## 2018-10-22 ENCOUNTER — Telehealth: Payer: Self-pay | Admitting: Family Medicine

## 2018-10-22 NOTE — Telephone Encounter (Signed)
Copied from [place] [id number]. Topic: Quick Communication - See Telephone Encounter >> Oct 22, 2018 10:44 AM Burchel, Abbi R wrote: CRM for notification. See Telephone encounter for: 10/22/18.   Pt suspects she has a UTI (frequent/burning urination).  She is requesting that Dr Pamella Pert call in abx for her to treat.  Pt also states she haas increased her water intake and is now experiencing some swelling in/around her feet/ankles.  Please call pt to advise.    [phone]

## 2018-10-22 NOTE — Telephone Encounter (Signed)
Please call pt and see if she can come into the office for an OV.

## 2018-10-25 ENCOUNTER — Telehealth: Payer: Self-pay | Admitting: *Deleted

## 2018-10-25 NOTE — Telephone Encounter (Signed)
SCHEDULE AWV (TELEMED)

## 2018-11-21 DIAGNOSIS — H35373 Puckering of macula, bilateral: Secondary | ICD-10-CM | POA: Diagnosis not present

## 2018-11-21 DIAGNOSIS — H35362 Drusen (degenerative) of macula, left eye: Secondary | ICD-10-CM | POA: Diagnosis not present

## 2018-11-21 DIAGNOSIS — H43812 Vitreous degeneration, left eye: Secondary | ICD-10-CM | POA: Diagnosis not present

## 2018-11-21 DIAGNOSIS — H1851 Endothelial corneal dystrophy: Secondary | ICD-10-CM | POA: Diagnosis not present

## 2019-01-08 ENCOUNTER — Telehealth: Payer: Self-pay | Admitting: *Deleted

## 2019-01-08 NOTE — Telephone Encounter (Signed)
Schedule AWV.  

## 2019-01-11 NOTE — Telephone Encounter (Signed)
Patient is calling back to speak to [place] regarding scheduling her AWV. Please advise thank you

## 2019-01-31 DIAGNOSIS — Z1231 Encounter for screening mammogram for malignant neoplasm of breast: Secondary | ICD-10-CM | POA: Diagnosis not present

## 2019-01-31 LAB — HM MAMMOGRAPHY

## 2019-02-11 ENCOUNTER — Ambulatory Visit: Payer: PPO

## 2019-02-11 ENCOUNTER — Other Ambulatory Visit: Payer: Self-pay

## 2019-02-11 ENCOUNTER — Telehealth: Payer: Self-pay | Admitting: Family Medicine

## 2019-02-11 ENCOUNTER — Ambulatory Visit (INDEPENDENT_AMBULATORY_CARE_PROVIDER_SITE_OTHER): Payer: PPO | Admitting: Family Medicine

## 2019-02-11 ENCOUNTER — Encounter: Payer: Self-pay | Admitting: Family Medicine

## 2019-02-11 VITALS — BP 128/80 | HR 100 | Temp 98.2°F | Ht 60.05 in | Wt 217.0 lb

## 2019-02-11 DIAGNOSIS — Z13220 Encounter for screening for lipoid disorders: Secondary | ICD-10-CM

## 2019-02-11 DIAGNOSIS — G8929 Other chronic pain: Secondary | ICD-10-CM

## 2019-02-11 DIAGNOSIS — H6093 Unspecified otitis externa, bilateral: Secondary | ICD-10-CM

## 2019-02-11 DIAGNOSIS — K644 Residual hemorrhoidal skin tags: Secondary | ICD-10-CM | POA: Diagnosis not present

## 2019-02-11 DIAGNOSIS — Z0001 Encounter for general adult medical examination with abnormal findings: Secondary | ICD-10-CM | POA: Diagnosis not present

## 2019-02-11 DIAGNOSIS — M25511 Pain in right shoulder: Secondary | ICD-10-CM

## 2019-02-11 DIAGNOSIS — Z131 Encounter for screening for diabetes mellitus: Secondary | ICD-10-CM

## 2019-02-11 DIAGNOSIS — Z0000 Encounter for general adult medical examination without abnormal findings: Secondary | ICD-10-CM

## 2019-02-11 MED ORDER — HYDROCODONE-ACETAMINOPHEN 5-325 MG PO TABS: 1.0000 | ORAL_TABLET | Freq: Four times a day (QID) | ORAL | 0 refills | Status: DC | PRN

## 2019-02-11 MED ORDER — ACETIC ACID 2 % OT SOLN: 4.0000 [drp] | OTIC | 0 refills | Status: DC

## 2019-02-11 MED ORDER — HYDROCORTISONE (PERIANAL) 2.5 % EX CREA: 1.0000 "application " | TOPICAL_CREAM | Freq: Two times a day (BID) | CUTANEOUS | 2 refills | Status: DC

## 2019-02-11 NOTE — Patient Instructions (Addendum)
   If you have lab work done today you will be contacted with your lab results within the next 2 weeks.  If you have not heard from us then please contact us. The fastest way to get your results is to register for My Chart.   IF you received an x-ray today, you will receive an invoice from [facility]. Please contact [facility] at [phone] with questions or concerns regarding your invoice.   IF you received labwork today, you will receive an invoice from [facility]. Please contact [facility] at [phone] with questions or concerns regarding your invoice.   Our billing staff will not be able to assist you with questions regarding bills from these companies.  You will be contacted with the lab results as soon as they are available. The fastest way to get your results is to activate your My Chart account. Instructions are located on the last page of this paperwork. If you have not heard from us regarding the results in 2 weeks, please contact this office.     Preventive Care 77 Years and Older, Female Preventive care refers to lifestyle choices and visits with your health care provider that can promote health and wellness. This includes:  A yearly physical exam. This is also called an annual well check.  Regular dental and eye exams.  Immunizations.  Screening for certain conditions.  Healthy lifestyle choices, such as diet and exercise. What can I expect for my preventive care visit? Physical exam Your health care provider will check:  Height and weight. These may be used to calculate body mass index (BMI), which is a measurement that tells if you are at a healthy weight.  Heart rate and blood pressure.  Your skin for abnormal spots. Counseling Your health care provider may ask you questions about:  Alcohol, tobacco, and drug use.  Emotional well-being.  Home and relationship well-being.  Sexual activity.  Eating habits.  History of  falls.  Memory and ability to understand (cognition).  Work and work environment.  Pregnancy and menstrual history. What immunizations do I need?  Influenza (flu) vaccine  This is recommended every year. Tetanus, diphtheria, and pertussis (Tdap) vaccine  You may need a Td booster every 10 years. Varicella (chickenpox) vaccine  You may need this vaccine if you have not already been vaccinated. Zoster (shingles) vaccine  You may need this after age 60. Pneumococcal conjugate (PCV13) vaccine  One dose is recommended after age 65. Pneumococcal polysaccharide (PPSV23) vaccine  One dose is recommended after age 65. Measles, mumps, and rubella (MMR) vaccine  You may need at least one dose of MMR if you were born in 1957 or later. You may also need a second dose. Meningococcal conjugate (MenACWY) vaccine  You may need this if you have certain conditions. Hepatitis A vaccine  You may need this if you have certain conditions or if you travel or work in places where you may be exposed to hepatitis A. Hepatitis B vaccine  You may need this if you have certain conditions or if you travel or work in places where you may be exposed to hepatitis B. Haemophilus influenzae type b (Hib) vaccine  You may need this if you have certain conditions. You may receive vaccines as individual doses or as more than one vaccine together in one shot (combination vaccines). Talk with your health care provider about the risks and benefits of combination vaccines. What tests do I need? Blood tests  Lipid and cholesterol levels. These   may be checked every 5 years, or more frequently depending on your overall health.  Hepatitis C test.  Hepatitis B test. Screening  Lung cancer screening. You may have this screening every year starting at age 55 if you have a 30-pack-year history of smoking and currently smoke or have quit within the past 15 years.  Colorectal cancer screening. All adults should  have this screening starting at age 50 and continuing until age 75. Your health care provider may recommend screening at age 45 if you are at increased risk. You will have tests every 1-10 years, depending on your results and the type of screening test.  Diabetes screening. This is done by checking your blood sugar (glucose) after you have not eaten for a while (fasting). You may have this done every 1-3 years.  Mammogram. This may be done every 1-2 years. Talk with your health care provider about how often you should have regular mammograms.  BRCA-related cancer screening. This may be done if you have a family history of breast, ovarian, tubal, or peritoneal cancers. Other tests  Sexually transmitted disease (STD) testing.  Bone density scan. This is done to screen for osteoporosis. You may have this done starting at age 65. Follow these instructions at home: Eating and drinking  Eat a diet that includes fresh fruits and vegetables, whole grains, lean protein, and low-fat dairy products. Limit your intake of foods with high amounts of sugar, saturated fats, and salt.  Take vitamin and mineral supplements as recommended by your health care provider.  Do not drink alcohol if your health care provider tells you not to drink.  If you drink alcohol: ? Limit how much you have to 0-1 drink a day. ? Be aware of how much alcohol is in your drink. In the [place] one drink equals one 12 oz bottle of beer (355 mL), one 5 oz glass of wine (148 mL), or one 1 oz glass of hard liquor (44 mL). Lifestyle  Take daily care of your teeth and gums.  Stay active. Exercise for at least 30 minutes on 5 or more days each week.  Do not use any products that contain nicotine or tobacco, such as cigarettes, e-cigarettes, and chewing tobacco. If you need help quitting, ask your health care provider.  If you are sexually active, practice safe sex. Use a condom or other form of protection in order to prevent STIs  (sexually transmitted infections).  Talk with your health care provider about taking a low-dose aspirin or statin. What's next?  Go to your health care provider once a year for a well check visit.  Ask your health care provider how often you should have your eyes and teeth checked.  Stay up to date on all vaccines. This information is not intended to replace advice given to you by your health care provider. Make sure you discuss any questions you have with your health care provider. Document Released: 08/14/2015 Document Revised: 07/12/2018 Document Reviewed: 07/12/2018 Elsevier Patient Education  2020 Elsevier Inc.  

## 2019-02-11 NOTE — Progress Notes (Signed)
Presents today for [employer] Visit-Subsequent.   Date of last exam: several years ago  Interpreter used for this visit? no  Patient Care Team: Rutherford Guys, MD as PCP - General (Family Medicine)   Other items to address today:   1. 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 Bilateral ear canal pain  Chronic Cleans ears out with bobby pins Denies any drainage, fever or chills No worsening of hearing loss  3. Hemorrhoids H/o surgery Having external hemorrhoids, not responding to prep h.  Itchy but not painful No blood  Cancer Screening: Cervical: n/a, hyst Breast: yes, July 2020, negative Colon: declines   Other Screening: Last screening for diabetes: checking today, fasting Last lipid screening: checking today, fasting Saw eye doctor 2 weeks ago, vision stable Has not seen dentist in about 2 years  ADVANCE DIRECTIVES: Discussed: yes Patient desires CPR (No ), mechanical ventilation (No ), prolonged artificial support (may include mechanical ventilation, tube/PEG feeding, etc) (No ). On File: no Materials Provided: n/a All of family has copies, DNR  Immunization status:   There is no immunization history on file for this patient.   There are no preventive care reminders to display for this patient.   Functional Status Survey: Is the patient deaf or have difficulty hearing?: Yes Does the patient have difficulty seeing, even when wearing glasses/contacts?: Yes Does the patient have difficulty concentrating, remembering, or making decisions?: No Does the patient have difficulty walking or climbing stairs?: No(due to fall, has pain so walking slow) Does the patient have difficulty dressing or  bathing?: No Does the patient have difficulty doing errands alone such as visiting a doctor's office or shopping?: No   Home Environment: does not have grab bars, not interested in adding to her home, recent fall tripped in her porch  Urinary Incontinence Screening: denies any incontinence of urine  Patient Active Problem List   Diagnosis Date Noted  . Status post right hip replacement 10/17/2017  . Nausea and vomiting 09/05/2017  . Anemia 09/05/2017  . Acute blood loss as cause of postoperative anemia 09/01/2017  . Primary osteoarthritis of right hip 08/29/2017  . Essential hypertension 07/22/2009  . GALLBLADDER DISEASE 07/22/2009  . ARTHRITIS 07/22/2009     Past Medical History:  Diagnosis Date  . Complication of anesthesia   . DJD (degenerative joint disease)    right hip  . Hypertension   . PONV (postoperative nausea and vomiting)   . Wears dentures   . Wears glasses      Past Surgical History:  Procedure Laterality Date  . ABDOMINAL HYSTERECTOMY    . APPENDECTOMY    . CATARACT EXTRACTION W/ INTRAOCULAR LENS  IMPLANT, BILATERAL    . CHOLECYSTECTOMY    . DILATION AND CURETTAGE OF UTERUS    . MULTIPLE TOOTH EXTRACTIONS    . TOTAL HIP ARTHROPLASTY Right 08/29/2017   Procedure: TOTAL HIP ARTHROPLASTY ANTERIOR APPROACH;  Surgeon: Melrose Nakayama, MD;  Location: [facility];  Service: Orthopedics;  Laterality: Right;     Family History  Problem Relation Age of Onset  . Diabetes Mother      Social History   Socioeconomic History  . Marital status: Widowed    Spouse name: Not  on file  . Number of children: Not on file  . Years of education: Not on file  . Highest education level: Not on file  Occupational History  . Not on file  Social Needs  . Financial resource strain: Not on file  . Food insecurity    Worry: Not on file    Inability: Not on file  . Transportation needs    Medical: Not on file    Non-medical: Not on file  Tobacco Use  . Smoking status:  Never Smoker  . Smokeless tobacco: Never Used  Substance and Sexual Activity  . Alcohol use: No  . Drug use: No  . Sexual activity: Yes    Birth control/protection: Surgical  Lifestyle  . Physical activity    Days per week: Not on file    Minutes per session: Not on file  . Stress: Not on file  Relationships  . Social Herbalist on phone: Not on file    Gets together: Not on file    Attends religious service: Not on file    Active member of club or organization: Not on file    Attends meetings of clubs or organizations: Not on file    Relationship status: Not on file  . Intimate partner violence    Fear of current or ex partner: Not on file    Emotionally abused: Not on file    Physically abused: Not on file    Forced sexual activity: Not on file  Other Topics Concern  . Not on file  Social History Narrative  . Not on file     Allergies  Allergen Reactions  . Adhesive [Tape] Other (See Comments)    Takes skin off   . Penicillins Rash and Other (See Comments)    PATIENT HAS HAD A PCN REACTION WITH IMMEDIATE RASH, FACIAL/TONGUE/THROAT SWELLING, SOB, OR LIGHTHEADEDNESS WITH HYPOTENSION:  #  #  #  YES  #  #  #   Has patient had a PCN reaction causing severe rash involving mucus membranes or skin necrosis: No Has patient had a PCN reaction that required hospitalization: No Has patient had a PCN reaction occurring within the last 10 years: No   . Latex Other (See Comments)    unlisted     Prior to Admission medications   Not on File     Depression screen [facility] 2/9 07/12/2018 04/19/2018 03/07/2018 02/19/2018 10/19/2017  Decreased Interest 0 0 0 0 0  Down, Depressed, Hopeless 0 0 0 0 0  PHQ - 2 Score 0 0 0 0 0     Fall Risk  02/11/2019 07/20/2018 07/12/2018 04/19/2018 03/07/2018  Falls in the past year? 1 1 1  No No  Number falls in past yr: 0 0 0 - -  Injury with Fall? 1 1 1  - -  Comment right hip pain - - - -    Review of Systems  Constitutional: Negative  for chills and fever.  Respiratory: Negative for cough and shortness of breath.   Cardiovascular: Negative for chest pain, palpitations and leg swelling.  Gastrointestinal: Negative for abdominal pain, nausea and vomiting.  per hpi  PHYSICAL EXAM: BP 128/80 (BP Location: Left Arm, Patient Position: Sitting, Cuff Size: Large)   Pulse 100   Temp 98.2 F (36.8 C) (Oral)   Ht 5' 0.05" (1.525 m)   Wt 217 lb (98.4 kg)   SpO2 97%   BMI 42.31 kg/m    Wt Readings from Last  3 Encounters:  02/11/19 217 lb (98.4 kg)  07/20/18 202 lb 12.8 oz (92 kg)  07/12/18 203 lb 9.6 oz (92.4 kg)      Hearing Screening   125Hz  250Hz  500Hz  1000Hz  2000Hz  3000Hz  4000Hz  6000Hz  8000Hz   Right ear:           Left ear:             Visual Acuity Screening   Right eye Left eye Both eyes  Without correction: 20/40 20/40 20/40   With correction:         Physical Exam  Nursing note and vitals reviewed. Constitutional: She is oriented to person, place, and time. She appears well-developed and well-nourished.  HENT:  Head: Normocephalic and atraumatic.  Right Ear: Tympanic membrane normal.  Both canals with mild swelling and erythema  Eyes: Pupils are equal, round, and reactive to light. EOM are normal.  Neck: Neck supple.  Cardiovascular: Normal rate, regular rhythm and intact distal pulses. Exam reveals no gallop and no friction rub.  No murmur heard. Respiratory: Effort normal. She has no wheezes. She has no rhonchi. She has no rales.  Musculoskeletal:     Right shoulder: She exhibits decreased range of motion and tenderness. She exhibits no swelling.  Neurological: She is alert and oriented to person, place, and time.  Skin: Skin is warm and dry.  Psychiatric: She has a normal mood and affect.     Education/Counseling provided regarding diet and exercise, prevention of chronic diseases, smoking/tobacco cessation, if applicable, and reviewed "Covered Medicare Preventive  Services."   ASSESSMENT/PLAN: 1. Encounter for Medicare annual wellness exam Routine HCM labs ordered. HCM reviewed/discussed. Anticipatory guidance regarding healthy weight, lifestyle and choices given.   2. Screening for diabetes mellitus - Basic Metabolic Panel  3. Screening for lipid disorders - Lipid panel  4. Chronic right shoulder pain - DG Shoulder Right; Future  5. Otitis externa of both ears, unspecified chronicity, unspecified type  6. External hemorrhoids  Other orders - HYDROcodone-acetaminophen (NORCO/VICODIN) 5-325 MG tablet; Take 1 tablet by mouth every 6 (six) hours as needed for moderate pain. - acetic acid 2 % otic solution; Place 4 drops into both ears every 3 (three) hours. - hydrocortisone (ANUSOL-HC) 2.5 % rectal cream; Place 1 application rectally 2 (two) times daily.   Return in about 1 year (around 02/11/2020).

## 2019-02-11 NOTE — Telephone Encounter (Signed)
Patient is calling to request should she get a paper to go to [facility] [phone]

## 2019-02-12 LAB — BASIC METABOLIC PANEL
BUN/Creatinine Ratio: 16 (ref 12–28)
BUN: 16 mg/dL (ref 8–27)
CO2: 19 mmol/L — ABNORMAL LOW (ref 20–29)
Calcium: 9.6 mg/dL (ref 8.7–10.3)
Chloride: 106 mmol/L (ref 96–106)
Creatinine, Ser: 1.02 mg/dL — ABNORMAL HIGH (ref 0.57–1.00)
GFR calc Af Amer: 62 mL/min/{1.73_m2} (ref >59)
GFR calc non Af Amer: 54 mL/min/{1.73_m2} — ABNORMAL LOW (ref >59)
Glucose: 90 mg/dL (ref 65–99)
Potassium: 4.4 mmol/L (ref 3.5–5.2)
Sodium: 142 mmol/L (ref 134–144)

## 2019-02-12 LAB — LIPID PANEL
Chol/HDL Ratio: 2.2 ratio (ref 0.0–4.4)
Cholesterol, Total: 151 mg/dL (ref 100–199)
HDL: 69 mg/dL (ref >39)
LDL Calculated: 64 mg/dL (ref 0–99)
Triglycerides: 91 mg/dL (ref 0–149)
VLDL Cholesterol Cal: 18 mg/dL (ref 5–40)

## 2019-03-07 ENCOUNTER — Telehealth: Payer: Self-pay | Admitting: Family Medicine

## 2019-03-07 NOTE — Telephone Encounter (Signed)
Copied from [place] [id number]. Topic: General - Other >> Mar 06, 2019  4:20 PM Mcneil, Ja-Kwan wrote: Reason for CRM: Pt stated she had labs done a few weeks ago but she has yet to get her results. Pt requests call back with lab results

## 2019-03-11 NOTE — Telephone Encounter (Signed)
Called pt and review labs with her. We have scheduled the pt for 1:40pm on Friday with provider to go over the kidney function test.

## 2019-03-15 ENCOUNTER — Encounter: Payer: Self-pay | Admitting: Family Medicine

## 2019-03-15 ENCOUNTER — Other Ambulatory Visit: Payer: Self-pay

## 2019-03-15 ENCOUNTER — Ambulatory Visit (INDEPENDENT_AMBULATORY_CARE_PROVIDER_SITE_OTHER): Payer: PPO | Admitting: Family Medicine

## 2019-03-15 VITALS — BP 187/76 | HR 93 | Temp 98.6°F | Ht 61.0 in | Wt 229.2 lb

## 2019-03-15 DIAGNOSIS — R030 Elevated blood-pressure reading, without diagnosis of hypertension: Secondary | ICD-10-CM

## 2019-03-15 DIAGNOSIS — N289 Disorder of kidney and ureter, unspecified: Secondary | ICD-10-CM

## 2019-03-15 NOTE — Patient Instructions (Signed)
° ° ° °  If you have lab work done today you will be contacted with your lab results within the next 2 weeks.  If you have not heard from us then please contact us. The fastest way to get your results is to register for My Chart. ° ° °IF you received an x-ray today, you will receive an invoice from [facility]. Please contact [facility] at [phone] with questions or concerns regarding your invoice.  ° °IF you received labwork today, you will receive an invoice from [facility]. Please contact [facility] at [phone] with questions or concerns regarding your invoice.  ° °Our billing staff will not be able to assist you with questions regarding bills from these companies. ° °You will be contacted with the lab results as soon as they are available. The fastest way to get your results is to activate your My Chart account. Instructions are located on the last page of this paperwork. If you have not heard from us regarding the results in 2 weeks, please contact this office. °  ° ° ° °

## 2019-03-15 NOTE — Progress Notes (Signed)
8/14/20201:58 PM  Barbara Soto [birth year], 78 y.o., female [id number]  Chief Complaint  Patient presents with  . review lab work    HPI:   Patient is a 78 y.o. female with past medical history significant for OA of left hip who presents today for followup on labs  Had not had anything to drink prior to those lab draws Planning on doing keto diet, has several friends how are doing really well on it Drinks mostly decaf tea with splenda Reports she has known white coat syndrome Has checked bp occ by local fire dept   Lab Results  Component Value Date   CREATININE 1.02 (H) 02/11/2019   BUN 16 02/11/2019   NA 142 02/11/2019   K 4.4 02/11/2019   CL 106 02/11/2019   CO2 19 (L) 02/11/2019   Lab Results  Component Value Date   CHOL 151 02/11/2019   HDL 69 02/11/2019   LDLCALC 64 02/11/2019   TRIG 91 02/11/2019   CHOLHDL 2.2 02/11/2019   Lab Results  Component Value Date   CREATININE 1.02 (H) 02/11/2019   CREATININE 0.79 10/19/2017   CREATININE 0.66 09/06/2017    Depression screen PHQ 2/9 07/12/2018 04/19/2018 03/07/2018  Decreased Interest 0 0 0  Down, Depressed, Hopeless 0 0 0  PHQ - 2 Score 0 0 0    Fall Risk  03/15/2019 02/11/2019 07/20/2018 07/12/2018 04/19/2018  Falls in the past year? 0 1 1 1  No  Number falls in past yr: 0 0 0 0 -  Injury with Fall? 0 1 1 1  -  Comment - right hip pain - - -  Risk for fall due to : History of fall(s) - - - -  Follow up Falls evaluation completed - - - -     Allergies  Allergen Reactions  . Adhesive [Tape] Other (See Comments)    Takes skin off   . Penicillins Rash and Other (See Comments)    PATIENT HAS HAD A PCN REACTION WITH IMMEDIATE RASH, FACIAL/TONGUE/THROAT SWELLING, SOB, OR LIGHTHEADEDNESS WITH HYPOTENSION:  #  #  #  YES  #  #  #   Has patient had a PCN reaction causing severe rash involving mucus membranes or skin necrosis: No Has patient had a PCN reaction that required hospitalization: No Has patient had a PCN  reaction occurring within the last 10 years: No   . Latex Other (See Comments)    unlisted    Prior to Admission medications   Medication Sig Start Date End Date Taking? Authorizing Provider  acetic acid 2 % otic solution Place 4 drops into both ears every 3 (three) hours. 02/11/19  Yes Rutherford Guys, MD  HYDROcodone-acetaminophen (NORCO/VICODIN) 5-325 MG tablet Take 1 tablet by mouth every 6 (six) hours as needed for moderate pain. 02/11/19  Yes Rutherford Guys, MD  hydrocortisone (ANUSOL-HC) 2.5 % rectal cream Place 1 application rectally 2 (two) times daily. 02/11/19  Yes Rutherford Guys, MD    Past Medical History:  Diagnosis Date  . Complication of anesthesia   . DJD (degenerative joint disease)    right hip  . Hypertension   . PONV (postoperative nausea and vomiting)   . Wears dentures   . Wears glasses     Past Surgical History:  Procedure Laterality Date  . ABDOMINAL HYSTERECTOMY    . APPENDECTOMY    . CATARACT EXTRACTION W/ INTRAOCULAR LENS  IMPLANT, BILATERAL    . CHOLECYSTECTOMY    . DILATION  AND CURETTAGE OF UTERUS    . MULTIPLE TOOTH EXTRACTIONS    . TOTAL HIP ARTHROPLASTY Right 08/29/2017   Procedure: TOTAL HIP ARTHROPLASTY ANTERIOR APPROACH;  Surgeon: Melrose Nakayama, MD;  Location: [facility];  Service: Orthopedics;  Laterality: Right;    Social History   Tobacco Use  . Smoking status: Never Smoker  . Smokeless tobacco: Never Used  Substance Use Topics  . Alcohol use: No    Family History  Problem Relation Age of Onset  . Diabetes Mother     Review of Systems  Constitutional: Negative for chills and fever.  Respiratory: Negative for cough and shortness of breath.   Cardiovascular: Negative for chest pain, palpitations and leg swelling.  Gastrointestinal: Negative for abdominal pain, nausea and vomiting.     OBJECTIVE:  Today's Vitals   03/15/19 1341  BP: (!) 187/76  Pulse: 93  Temp: 98.6 F (37 C)  TempSrc: Oral  SpO2: 96%  Weight: 229  lb 3.2 oz (104 kg)  Height: 5\' 1"  (1.549 m)   Body mass index is 43.31 kg/m.  BP Readings from Last 3 Encounters:  03/15/19 (!) 187/76  02/11/19 128/80  07/20/18 (!) 147/77    Physical Exam Vitals signs and nursing note reviewed.  Constitutional:      Appearance: She is well-developed.  HENT:     Head: Normocephalic and atraumatic.     Mouth/Throat:     Pharynx: No oropharyngeal exudate.  Eyes:     General: No scleral icterus.    Conjunctiva/sclera: Conjunctivae normal.     Pupils: Pupils are equal, round, and reactive to light.  Neck:     Musculoskeletal: Neck supple.  Cardiovascular:     Rate and Rhythm: Normal rate and regular rhythm.     Heart sounds: Normal heart sounds. No murmur. No friction rub. No gallop.   Pulmonary:     Effort: Pulmonary effort is normal.     Breath sounds: Normal breath sounds. No wheezing or rales.  Skin:    General: Skin is warm and dry.  Neurological:     Mental Status: She is alert and oriented to person, place, and time.     ASSESSMENT and PLAN  1. Decreased renal function Push fluids. Recheck labs - Basic Metabolic Panel; Future  2. Elevated BP without diagnosis of hypertension Asymptomatic. Declines recheck. States has white coat syndrome.   3. Severe obesity (BMI >= 40) (HCC) She will be starting keto diet. Discussed importance of maintaining hydration.     Return if symptoms worsen or fail to improve.    Rutherford Guys, MD Primary Care at [facility] [place], [place] [address] Ph.  [phone] Fax [phone]

## 2019-03-27 ENCOUNTER — Other Ambulatory Visit: Payer: Self-pay | Admitting: Family Medicine

## 2019-03-27 NOTE — Telephone Encounter (Signed)
Requested medication (s) are due for refill today: no  Requested medication (s) are on the active medication list:no  Last refill:    Future visit scheduled: no  Notes to clinic: The original prescription was discontinued on 07/12/2018 by Annabelle Harman L, CMA for the following reason: Error. Renewing this prescription may not be appropriate.  Requested Prescriptions  Pending Prescriptions Disp Refills   fluticasone (FLONASE) 50 MCG/ACT nasal spray [Pharmacy Med Name: FLUTICASONE PROP 50 MCG SPRAY] 16 mL 6    Sig: SPRAY 1 SPRAY INTO EACH NOSTRIL TWICE A DAY     Ear, Nose, and Throat: Nasal Preparations - Corticosteroids Passed - 03/27/2019 12:54 PM      Passed - Valid encounter within last 12 months    Recent Outpatient Visits          1 week ago Decreased renal function   [facility], Lilia Argue, MD   1 month ago Encounter for Medicare annual wellness exam   [facility], Lilia Argue, MD   8 months ago Acute hip pain, left   [facility], Lilia Argue, MD   8 months ago Lower respiratory infection   Primary Care at [facility], [place], [place]   11 months ago Episode of dizziness   Primary Care at [facility], [place], [place]

## 2019-04-22 ENCOUNTER — Telehealth: Payer: Self-pay | Admitting: Family Medicine

## 2019-04-22 NOTE — Telephone Encounter (Signed)
Please advise 

## 2019-04-22 NOTE — Telephone Encounter (Signed)
Medication: HYDROcodone-acetaminophen (NORCO/VICODIN) 5-325 MG tablet [id number]   Has the patient contacted their pharmacy? Yes  (Agent: If no, request that the patient contact the pharmacy for the refill.) (Agent: If yes, when and what did the pharmacy advise?)  Preferred Pharmacy (with phone number or street name): [id number] - [place], [place] - [address] [phone] (Phone) [phone] (Fax)    Agent: Please be advised that RX refills may take up to 3 business days. We ask that you follow-up with your pharmacy.

## 2019-04-23 MED ORDER — HYDROCODONE-ACETAMINOPHEN 5-325 MG PO TABS: 1.0000 | ORAL_TABLET | Freq: Four times a day (QID) | ORAL | 0 refills | Status: DC | PRN

## 2019-04-23 NOTE — Telephone Encounter (Signed)
pmp reviewed  Med refilled 

## 2019-06-24 ENCOUNTER — Telehealth: Payer: Self-pay | Admitting: Family Medicine

## 2019-06-24 NOTE — Telephone Encounter (Signed)
Pt states she fell 2 weeks ago, and having hip pain.  Pt wants an xray of her hip and her right shoulder.    She never got the xray of shoulder back in July.  Pt also wanting a few hydrocodone for the pain.  Unable to reach the office for appt.

## 2019-06-25 NOTE — Telephone Encounter (Signed)
Please schedule appt asap if possible. Will not fill narcotics without office visit in case patient ask. Also how would they get the xray without coming into the office

## 2019-09-29 ENCOUNTER — Ambulatory Visit: Payer: PPO | Attending: Internal Medicine

## 2019-09-29 DIAGNOSIS — Z23 Encounter for immunization: Secondary | ICD-10-CM

## 2019-09-29 NOTE — Progress Notes (Signed)
   Covid-19 Vaccination Clinic  Name:  Barbara Soto    MRN: [id number] DOB: 05/03/1941  09/29/2019  [name] was observed post Covid-19 immunization for 15 minutes without incidence. She was provided with Vaccine Information Sheet and instruction to access the V-Safe system.   [name] was instructed to call 911 with any severe reactions post vaccine: Marland Kitchen Difficulty breathing  . Swelling of your face and throat  . A fast heartbeat  . A bad rash all over your body  . Dizziness and weakness    Immunizations Administered    Name Date Dose VIS Date Route   Pfizer COVID-19 Vaccine 09/29/2019  2:09 PM 0.3 mL 07/12/2019 Intramuscular   Manufacturer: [place]   Lot: [id number]   [place]: [id number]

## 2019-09-30 ENCOUNTER — Telehealth: Payer: Self-pay | Admitting: Family Medicine

## 2019-09-30 NOTE — Telephone Encounter (Signed)
Pt called regarding a referral for right shoulder to get x-rayed. [phone] Please advise.

## 2019-10-01 NOTE — Telephone Encounter (Signed)
Pt was lvmtcb

## 2019-10-01 NOTE — Telephone Encounter (Signed)
Pt called back regarding xray appt that needs to be set up by office . Please advise

## 2019-10-02 NOTE — Telephone Encounter (Signed)
Is she needing an appointment with pcp?

## 2019-10-02 NOTE — Telephone Encounter (Signed)
Pt was lvmtcb

## 2019-10-04 DIAGNOSIS — M25511 Pain in right shoulder: Secondary | ICD-10-CM | POA: Diagnosis not present

## 2019-10-15 ENCOUNTER — Telehealth: Payer: Self-pay | Admitting: Family Medicine

## 2019-10-15 NOTE — Telephone Encounter (Signed)
Pt called and per pt she wants ambion because she hasn't slept in 3 days. Pt was made aware she might need to sch apt for this. Pt would like a nurse to give her a call. [phone] please advise.

## 2019-10-16 ENCOUNTER — Other Ambulatory Visit: Payer: Self-pay

## 2019-10-16 ENCOUNTER — Telehealth (INDEPENDENT_AMBULATORY_CARE_PROVIDER_SITE_OTHER): Payer: PPO | Admitting: Registered Nurse

## 2019-10-16 ENCOUNTER — Encounter: Payer: Self-pay | Admitting: Registered Nurse

## 2019-10-16 DIAGNOSIS — F5104 Psychophysiologic insomnia: Secondary | ICD-10-CM

## 2019-10-16 MED ORDER — ZOLPIDEM TARTRATE 5 MG PO TABS: 5.0000 mg | ORAL_TABLET | Freq: Every evening | ORAL | 1 refills | Status: DC | PRN

## 2019-10-16 NOTE — Patient Instructions (Signed)
° ° ° °  If you have lab work done today you will be contacted with your lab results within the next 2 weeks.  If you have not heard from us then please contact us. The fastest way to get your results is to register for My Chart. ° ° °IF you received an x-ray today, you will receive an invoice from [facility]. Please contact [facility] at [phone] with questions or concerns regarding your invoice.  ° °IF you received labwork today, you will receive an invoice from [facility]. Please contact [facility] at [phone] with questions or concerns regarding your invoice.  ° °Our billing staff will not be able to assist you with questions regarding bills from these companies. ° °You will be contacted with the lab results as soon as they are available. The fastest way to get your results is to activate your My Chart account. Instructions are located on the last page of this paperwork. If you have not heard from us regarding the results in 2 weeks, please contact this office. °  ° ° ° °

## 2019-10-17 ENCOUNTER — Telehealth: Payer: Self-pay

## 2019-10-17 NOTE — Telephone Encounter (Signed)
Matter handled, given ambien by Orland Mustard, will call if medication does not help with sleeping

## 2019-10-19 IMAGING — CR DG HIP (WITH OR WITHOUT PELVIS) 2-3V*L*
3 series · 3 of 3 positions shown · non-contrast
Comparison: None

CLINICAL DATA: LEFT hip pain after falling 4 days ago

EXAM:
DG HIP (WITH OR WITHOUT PELVIS) 2-3V LEFT

[w hip ap left]
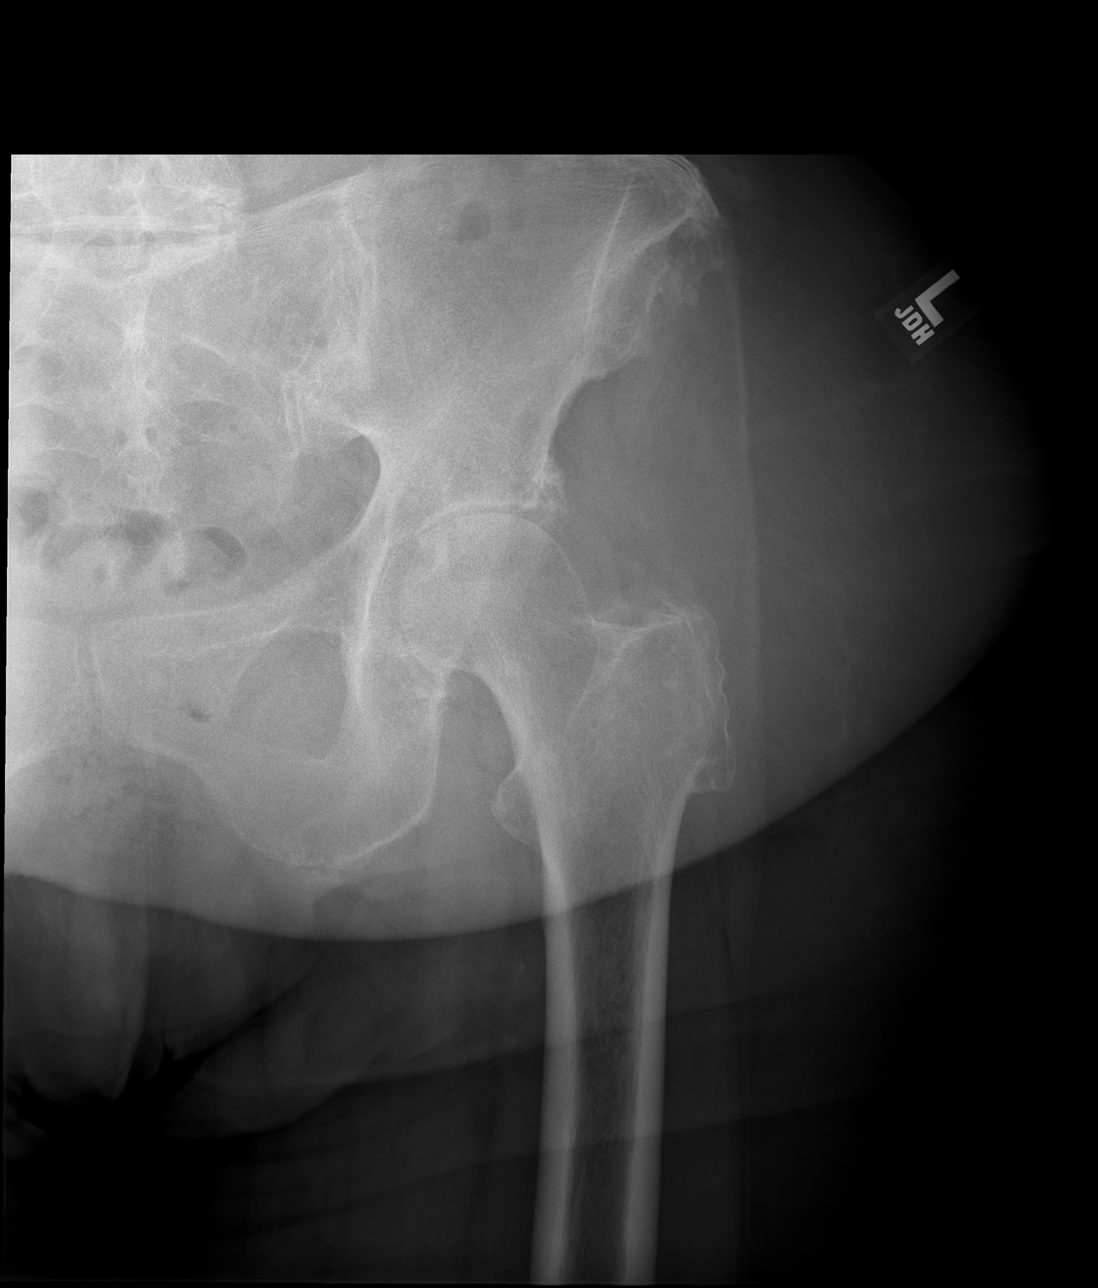

[w hip lat left]
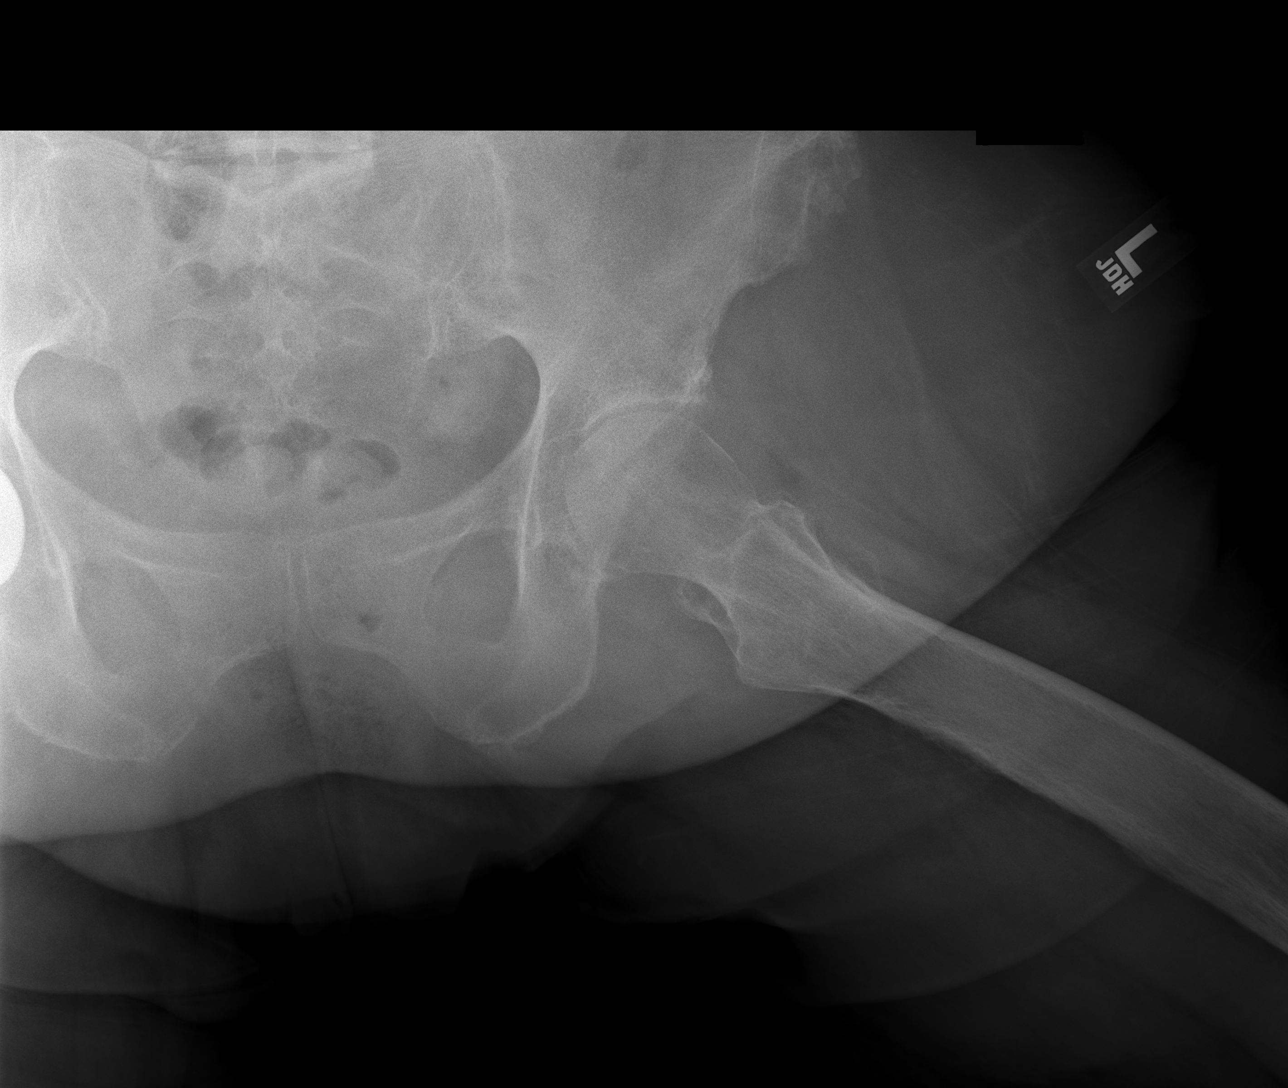

[w pelvis upright]
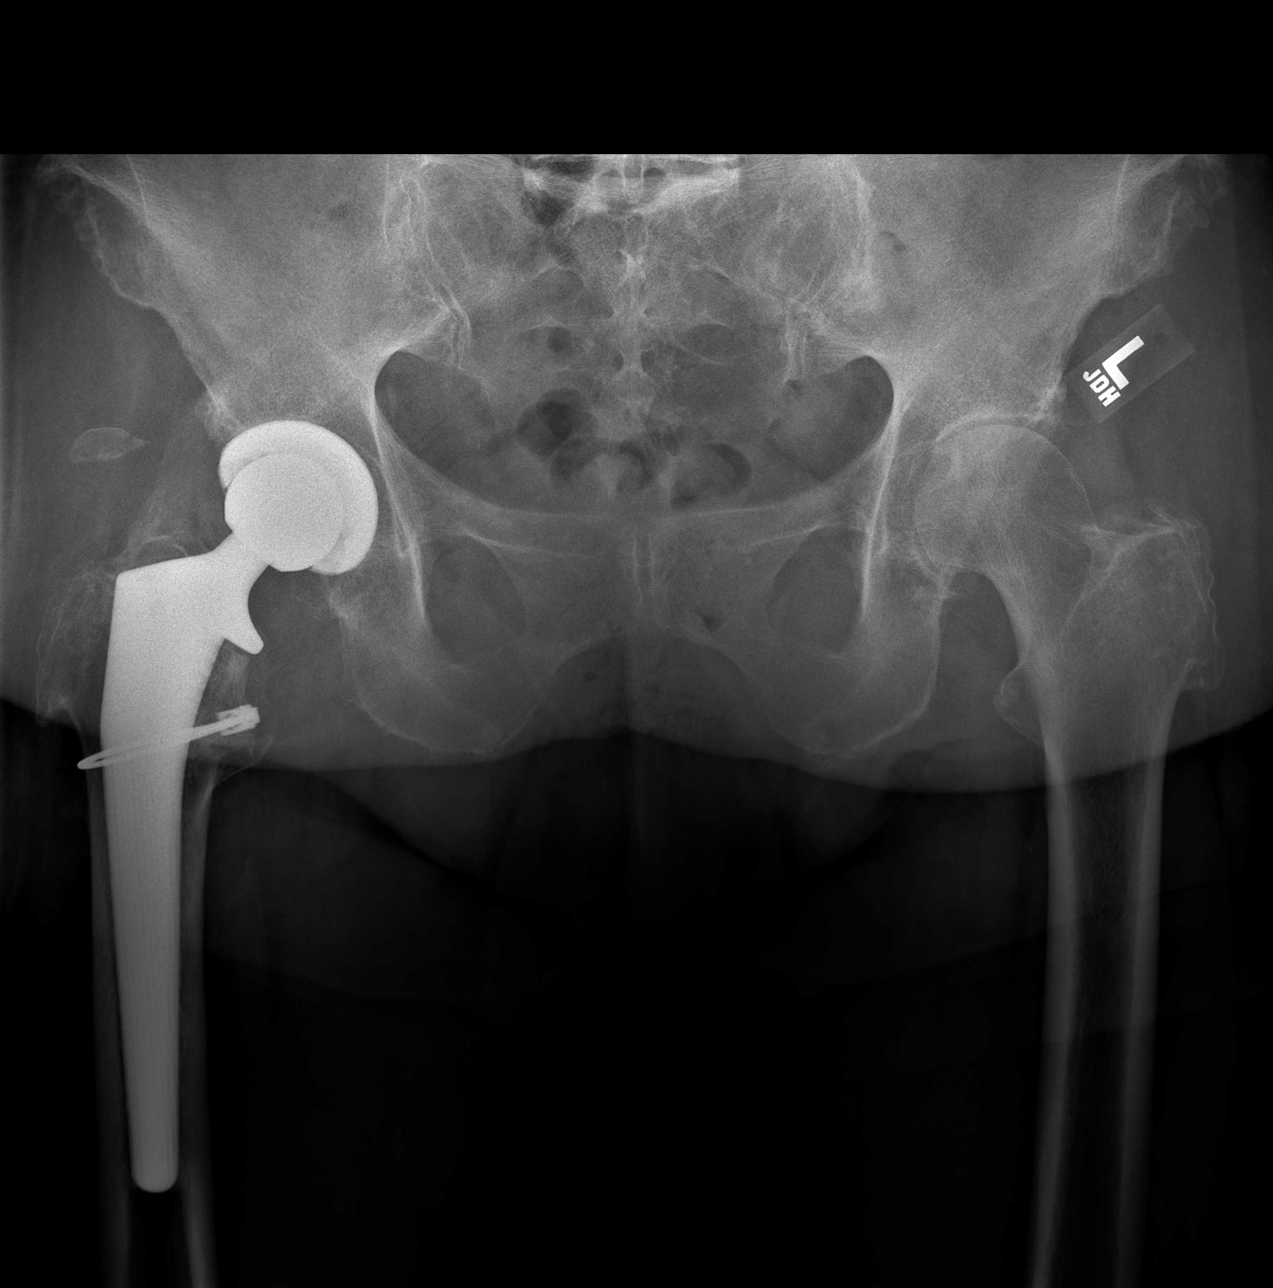

[3 of 3 positions shown; findings below may reference images not displayed]

FINDINGS: Osseous demineralization.

Narrowing of the LEFT hip joint.

RIGHT hip prosthesis identified.

SI joints preserved.

No acute fracture, dislocation, or bone destruction.

Mild degenerative disc disease changes at visualized lower lumbar
spine.
IMPRESSION: Osseous demineralization with degenerative changes of the LEFT hip
joint.

No acute abnormalities.

## 2019-10-22 ENCOUNTER — Ambulatory Visit: Payer: PPO | Attending: Internal Medicine

## 2019-10-22 DIAGNOSIS — Z23 Encounter for immunization: Secondary | ICD-10-CM

## 2019-10-22 NOTE — Progress Notes (Signed)
   Covid-19 Vaccination Clinic  Name:  Barbara Soto    MRN: [id number] DOB: 15-Feb-1941  10/22/2019  Barbara Soto was observed post Covid-19 immunization for 15 minutes without incident. She was provided with Vaccine Information Sheet and instruction to access the V-Safe system.   Barbara Soto was instructed to call 911 with any severe reactions post vaccine: Marland Kitchen Difficulty breathing  . Swelling of face and throat  . A fast heartbeat  . A bad rash all over body  . Dizziness and weakness   Immunizations Administered    Name Date Dose VIS Date Route   Pfizer COVID-19 Vaccine 10/22/2019  1:56 PM 0.3 mL 07/12/2019 Intramuscular   Manufacturer: [employer]   Lot: [id number]   [place]: [id number]

## 2019-10-24 NOTE — Progress Notes (Signed)
Telemedicine Encounter- SOAP NOTE Established Patient  This telephone encounter was conducted with the patient's (or proxy's) verbal consent via audio telecommunications: yes  Patient was instructed to have this encounter in a suitably private space; and to only have persons present to whom they give permission to participate. In addition, patient identity was confirmed by use of name plus two identifiers (DOB and address).  I discussed the limitations, risks, security and privacy concerns of performing an evaluation and management service by telephone and the availability of in person appointments. I also discussed with the patient that there may be a patient responsible charge related to this service. The patient expressed understanding and agreed to proceed.  I spent a total of 14 minutes talking with the patient or their proxy.  Chief Complaint  Patient presents with  . Insomnia    patient states she has not been able to sleep for aboout 3-4 days states that she has slept only 3 hours in 4 days and just cant take it anymore . Per patient she has been somebody who sleeps for a long time so if she gets two or three hours a day she will be happy     Subjective   Barbara Soto is a 79 y.o. established patient. Telephone visit today for insomnia  HPI Pt states she has slept maybe 3-4 hours in the preceding 3-4 days She is unaware of why this is happening but feels like it is driving her crazy. She usually only sleeps for around 4 hours a night without any concern, but occasionally will get into "ruts" where she can't sleep at all. She has tried OTCs and nonpharm without relief She has taken [place] in the past with good effect.   Patient Active Problem List   Diagnosis Date Noted  . Status post right hip replacement 10/17/2017  . Nausea and vomiting 09/05/2017  . Anemia 09/05/2017  . Acute blood loss as cause of postoperative anemia 09/01/2017  . Primary osteoarthritis of right hip  08/29/2017  . Essential hypertension 07/22/2009  . GALLBLADDER DISEASE 07/22/2009  . ARTHRITIS 07/22/2009    Past Medical History:  Diagnosis Date  . Complication of anesthesia   . DJD (degenerative joint disease)    right hip  . Hypertension   . PONV (postoperative nausea and vomiting)   . Wears dentures   . Wears glasses     Current Outpatient Medications  Medication Sig Dispense Refill  . acetic acid 2 % otic solution Place 4 drops into both ears every 3 (three) hours. (Patient not taking: Reported on 10/16/2019) 15 mL 0  . HYDROcodone-acetaminophen (NORCO/VICODIN) 5-325 MG tablet Take 1 tablet by mouth every 6 (six) hours as needed for moderate pain. (Patient not taking: Reported on 10/16/2019) 30 tablet 0  . hydrocortisone (ANUSOL-HC) 2.5 % rectal cream Place 1 application rectally 2 (two) times daily. (Patient not taking: Reported on 10/16/2019) 30 g 2  . zolpidem (AMBIEN) 5 MG tablet Take 1 tablet (5 mg total) by mouth at bedtime as needed for sleep. 15 tablet 1   No current facility-administered medications for this visit.    Allergies  Allergen Reactions  . Adhesive [Tape] Other (See Comments)    Takes skin off   . Penicillins Rash and Other (See Comments)    PATIENT HAS HAD A PCN REACTION WITH IMMEDIATE RASH, FACIAL/TONGUE/THROAT SWELLING, SOB, OR LIGHTHEADEDNESS WITH HYPOTENSION:  #  #  #  YES  #  #  #  Has patient had a PCN reaction causing severe rash involving mucus membranes or skin necrosis: No Has patient had a PCN reaction that required hospitalization: No Has patient had a PCN reaction occurring within the last 10 years: No   . Latex Other (See Comments)    unlisted    Social History   Socioeconomic History  . Marital status: Widowed    Spouse name: Not on file  . Number of children: Not on file  . Years of education: Not on file  . Highest education level: Not on file  Occupational History  . Not on file  Tobacco Use  . Smoking status: Never  Smoker  . Smokeless tobacco: Never Used  Substance and Sexual Activity  . Alcohol use: No  . Drug use: No  . Sexual activity: Yes    Birth control/protection: Surgical  Other Topics Concern  . Not on file  Social History Narrative  . Not on file   Social Determinants of Health   Financial Resource Strain:   . Difficulty of Paying Living Expenses:   Food Insecurity:   . Worried About Charity fundraiser in the Last Year:   . Arboriculturist in the Last Year:   Transportation Needs:   . Film/video editor (Medical):   Marland Kitchen Lack of Transportation (Non-Medical):   Physical Activity:   . Days of Exercise per Week:   . Minutes of Exercise per Session:   Stress:   . Feeling of Stress :   Social Connections:   . Frequency of Communication with Friends and Family:   . Frequency of Social Gatherings with Friends and Family:   . Attends Religious Services:   . Active Member of Clubs or Organizations:   . Attends Archivist Meetings:   Marland Kitchen Marital Status:   Intimate Partner Violence:   . Fear of Current or Ex-Partner:   . Emotionally Abused:   Marland Kitchen Physically Abused:   . Sexually Abused:     ROS Per hpi   Objective   Vitals as reported by the patient: There were no vitals filed for this visit.  Barbara Soto was seen today for insomnia.  Diagnoses and all orders for this visit:  Psychophysiologic insomnia -     zolpidem (AMBIEN) 5 MG tablet; Take 1 tablet (5 mg total) by mouth at bedtime as needed for sleep.   PLAN  Discussed r/b/se of zolpidem use in 65+ patients - this is not a medication designed to be taken regularly. Pt states that a 30 tablet supply would likely last her around a year. She is aware to limit her fall risk and not to be active when taking this medication  RTC reaons reviewed  Patient encouraged to call clinic with any questions, comments, or concerns.  I discussed the assessment and treatment plan with the patient. The patient was provided an  opportunity to ask questions and all were answered. The patient agreed with the plan and demonstrated an understanding of the instructions.   The patient was advised to call back or seek an in-person evaluation if the symptoms worsen or if the condition fails to improve as anticipated.  I provided 14 minutes of non-face-to-face time during this encounter.  Maximiano Coss, NP  Primary Care at [facility]

## 2019-11-06 ENCOUNTER — Encounter: Payer: Self-pay | Admitting: Adult Health Nurse Practitioner

## 2019-11-06 ENCOUNTER — Telehealth: Payer: PPO | Admitting: Adult Health Nurse Practitioner

## 2019-11-06 ENCOUNTER — Other Ambulatory Visit: Payer: Self-pay

## 2019-11-06 NOTE — Patient Instructions (Signed)
° ° ° °  If you have lab work done today you will be contacted with your lab results within the next 2 weeks.  If you have not heard from us then please contact us. The fastest way to get your results is to register for My Chart. ° ° °IF you received an x-ray today, you will receive an invoice from [facility]. Please contact [facility] at [phone] with questions or concerns regarding your invoice.  ° °IF you received labwork today, you will receive an invoice from [facility]. Please contact [facility] at [phone] with questions or concerns regarding your invoice.  ° °Our billing staff will not be able to assist you with questions regarding bills from these companies. ° °You will be contacted with the lab results as soon as they are available. The fastest way to get your results is to activate your My Chart account. Instructions are located on the last page of this paperwork. If you have not heard from us regarding the results in 2 weeks, please contact this office. °  ° ° ° °

## 2019-11-07 MED ORDER — NYSTATIN 100000 UNIT/ML MT SUSP: 5.0000 mL | Freq: Four times a day (QID) | OROMUCOSAL | 2 refills | Status: DC

## 2020-01-08 ENCOUNTER — Ambulatory Visit: Payer: Self-pay | Admitting: *Deleted

## 2020-01-08 ENCOUNTER — Other Ambulatory Visit: Payer: Self-pay

## 2020-01-08 ENCOUNTER — Ambulatory Visit (INDEPENDENT_AMBULATORY_CARE_PROVIDER_SITE_OTHER): Payer: PPO | Admitting: Registered Nurse

## 2020-01-08 ENCOUNTER — Ambulatory Visit (INDEPENDENT_AMBULATORY_CARE_PROVIDER_SITE_OTHER): Payer: PPO

## 2020-01-08 VITALS — BP 172/94 | HR 87 | Temp 97.6°F | Ht 61.0 in | Wt 229.2 lb

## 2020-01-08 DIAGNOSIS — F5104 Psychophysiologic insomnia: Secondary | ICD-10-CM

## 2020-01-08 DIAGNOSIS — M545 Low back pain, unspecified: Secondary | ICD-10-CM

## 2020-01-08 MED ORDER — HYDROCODONE-ACETAMINOPHEN 5-325 MG PO TABS: 1.0000 | ORAL_TABLET | Freq: Four times a day (QID) | ORAL | 0 refills | Status: DC | PRN

## 2020-01-08 MED ORDER — ZOLPIDEM TARTRATE 5 MG PO TABS: 5.0000 mg | ORAL_TABLET | Freq: Every evening | ORAL | 0 refills | Status: DC | PRN

## 2020-01-08 NOTE — Progress Notes (Signed)
IF we could call Barbara Soto Gin shows no acute changes, but we are seeing those degenerative changes somewhat progressed as compared to previous. No cause for concern at this time. Continue pain control and I will send exercises in mail soon.  Thank you,  Kathrin Ruddy, NP

## 2020-01-08 NOTE — Telephone Encounter (Signed)
Pt called in c/o lower back pain after tripping on the step of her porch and falling 2 days ago.   She is requesting Oxycodone. I let her know she would need to be seen before a doctor is going to prescribe her this strong a pain medication.  See triage notes.  I transferred her call into Primary Care on [place] to be scheduled.  Reason for Disposition . [1] MODERATE back pain (e.g., interferes with normal activities) AND [2] present > 3 days  Answer Assessment - Initial Assessment Questions 1. ONSET: "When did the pain begin?"      I have back pain.  I tripped and fell.   I'm using heating and ice.   It's just a constant hurt when I sit down.   "I think I pulled a muscle"" 2. LOCATION: "Where does it hurt?" (upper, mid or lower back)     It happened day before yesterday.   She is requesting Oxycodone for the pain.  I've been without sleep for 3 sleep.   I'm taking Ambien to help.  I'm not sleeping.   3. SEVERITY: "How bad is the pain?"  (e.g., Scale 1-10; mild, moderate, or severe)   - MILD (1-3): doesn't interfere with normal activities    - MODERATE (4-7): interferes with normal activities or awakens from sleep    - SEVERE (8-10): excruciating pain, unable to do any normal activities      7-8 on pain scale.  4. PATTERN: "Is the pain constant?" (e.g., yes, no; constant, intermittent)      Pain is in my lower back.   I fell on the porch and I'm bruised but it's gone now.   I'm using Aleve.   It takes the edge off. If I'm up doing anything it hurts.   If I sit for 30 minutes and get up it hurts. 5. RADIATION: "Does the pain shoot into your legs or elsewhere?"  No pain down either leg.     I fell and broke my hip and it cannot be fixed.   I've had my hip replaced and they had trouble fixing it.   It crushed.    6. CAUSE:  "What do you think is causing the back pain?"      I think I pulled a muscle when I fell on the porch.   My shoe caught the corner of the porch and I fell.  The front of my  shoe caught under the step.  I scrapped my arm but it's fine. 7. BACK OVERUSE:  "Any recent lifting of heavy objects, strenuous work or exercise?"     No 8. MEDICATIONS: "What have you taken so far for the pain?" (e.g., nothing, acetaminophen, NSAIDS)     Aleve, heating pad and ice helps some. 9. NEUROLOGIC SYMPTOMS: "Do you have any weakness, numbness, or problems with bowel/bladder control?"     Right hip is messed up from a prior fall and surgery. 10. OTHER SYMPTOMS: "Do you have any other symptoms?" (e.g., fever, abdominal pain, burning with urination, blood in urine)       No urinary symptoms.   11. PREGNANCY: "Is there any chance you are pregnant?" (e.g., yes, no; LMP)       N/A due to age  Protocols used: BACK PAIN-A-AH

## 2020-01-08 NOTE — Patient Instructions (Signed)
° ° ° °  If you have lab work done today you will be contacted with your lab results within the next 2 weeks.  If you have not heard from us then please contact us. The fastest way to get your results is to register for My Chart. ° ° °IF you received an x-ray today, you will receive an invoice from [facility]. Please contact [facility] at [phone] with questions or concerns regarding your invoice.  ° °IF you received labwork today, you will receive an invoice from [facility]. Please contact [facility] at [phone] with questions or concerns regarding your invoice.  ° °Our billing staff will not be able to assist you with questions regarding bills from these companies. ° °You will be contacted with the lab results as soon as they are available. The fastest way to get your results is to activate your My Chart account. Instructions are located on the last page of this paperwork. If you have not heard from us regarding the results in 2 weeks, please contact this office. °  ° ° ° °

## 2020-02-05 DIAGNOSIS — Z1231 Encounter for screening mammogram for malignant neoplasm of breast: Secondary | ICD-10-CM | POA: Diagnosis not present

## 2020-02-05 LAB — HM MAMMOGRAPHY

## 2020-02-10 ENCOUNTER — Telehealth: Payer: Self-pay | Admitting: Family Medicine

## 2020-02-10 NOTE — Telephone Encounter (Signed)
Pt would like a call regarding her AWV on 02/12/20. Please advise.

## 2020-02-12 ENCOUNTER — Other Ambulatory Visit: Payer: Self-pay | Admitting: Registered Nurse

## 2020-02-12 ENCOUNTER — Ambulatory Visit: Payer: PPO | Admitting: Registered Nurse

## 2020-02-12 VITALS — Ht 61.0 in | Wt 229.0 lb

## 2020-02-12 DIAGNOSIS — F5104 Psychophysiologic insomnia: Secondary | ICD-10-CM

## 2020-02-12 DIAGNOSIS — Z0000 Encounter for general adult medical examination without abnormal findings: Secondary | ICD-10-CM

## 2020-02-12 MED ORDER — ZOLPIDEM TARTRATE 5 MG PO TABS: 5.0000 mg | ORAL_TABLET | Freq: Every evening | ORAL | 0 refills | Status: DC | PRN

## 2020-02-12 NOTE — Patient Instructions (Addendum)
Thank you for taking time to come for your Medicare Wellness Visit. I appreciate your ongoing commitment to your health goals. Please review the following plan we discussed and let me know if I can assist you in the future.  Zoltan Genest LPN  Preventive Care 78 Years and Older, Female Preventive care refers to lifestyle choices and visits with your health care provider that can promote health and wellness. This includes:  A yearly physical exam. This is also called an annual well check.  Regular dental and eye exams.  Immunizations.  Screening for certain conditions.  Healthy lifestyle choices, such as diet and exercise. What can I expect for my preventive care visit? Physical exam Your health care provider will check:  Height and weight. These may be used to calculate body mass index (BMI), which is a measurement that tells if you are at a healthy weight.  Heart rate and blood pressure.  Your skin for abnormal spots. Counseling Your health care provider may ask you questions about:  Alcohol, tobacco, and drug use.  Emotional well-being.  Home and relationship well-being.  Sexual activity.  Eating habits.  History of falls.  Memory and ability to understand (cognition).  Work and work environment.  Pregnancy and menstrual history. What immunizations do I need?  Influenza (flu) vaccine  This is recommended every year. Tetanus, diphtheria, and pertussis (Tdap) vaccine  You may need a Td booster every 10 years. Varicella (chickenpox) vaccine  You may need this vaccine if you have not already been vaccinated. Zoster (shingles) vaccine  You may need this after age 78. Pneumococcal conjugate (PCV13) vaccine  One dose is recommended after age 78. Pneumococcal polysaccharide (PPSV23) vaccine  One dose is recommended after age 78. Measles, mumps, and rubella (MMR) vaccine  You may need at least one dose of MMR if you were born in 1957 or later. You may also  need a second dose. Meningococcal conjugate (MenACWY) vaccine  You may need this if you have certain conditions. Hepatitis A vaccine  You may need this if you have certain conditions or if you travel or work in places where you may be exposed to hepatitis A. Hepatitis B vaccine  You may need this if you have certain conditions or if you travel or work in places where you may be exposed to hepatitis B. Haemophilus influenzae type b (Hib) vaccine  You may need this if you have certain conditions. You may receive vaccines as individual doses or as more than one vaccine together in one shot (combination vaccines). Talk with your health care provider about the risks and benefits of combination vaccines. What tests do I need? Blood tests  Lipid and cholesterol levels. These may be checked every 5 years, or more frequently depending on your overall health.  Hepatitis C test.  Hepatitis B test. Screening  Lung cancer screening. You may have this screening every year starting at age 78 if you have a 30-pack-year history of smoking and currently smoke or have quit within the past 15 years.  Colorectal cancer screening. All adults should have this screening starting at age 78 and continuing until age 78. Your health care provider may recommend screening at age 45 if you are at increased risk. You will have tests every 1-10 years, depending on your results and the type of screening test.  Diabetes screening. This is done by checking your blood sugar (glucose) after you have not eaten for a while (fasting). You may have this done every 1-3   years.  Mammogram. This may be done every 1-2 years. Talk with your health care provider about how often you should have regular mammograms.  BRCA-related cancer screening. This may be done if you have a family history of breast, ovarian, tubal, or peritoneal cancers. Other tests  Sexually transmitted disease (STD) testing.  Bone density scan. This is done  to screen for osteoporosis. You may have this done starting at age 78. Follow these instructions at home: Eating and drinking  Eat a diet that includes fresh fruits and vegetables, whole grains, lean protein, and low-fat dairy products. Limit your intake of foods with high amounts of sugar, saturated fats, and salt.  Take vitamin and mineral supplements as recommended by your health care provider.  Do not drink alcohol if your health care provider tells you not to drink.  If you drink alcohol: ? Limit how much you have to 0-1 drink a day. ? Be aware of how much alcohol is in your drink. In the [place] one drink equals one 12 oz bottle of beer (355 mL), one 5 oz glass of wine (148 mL), or one 1 oz glass of hard liquor (44 mL). Lifestyle  Take daily care of your teeth and gums.  Stay active. Exercise for at least 30 minutes on 5 or more days each week.  Do not use any products that contain nicotine or tobacco, such as cigarettes, e-cigarettes, and chewing tobacco. If you need help quitting, ask your health care provider.  If you are sexually active, practice safe sex. Use a condom or other form of protection in order to prevent STIs (sexually transmitted infections).  Talk with your health care provider about taking a low-dose aspirin or statin. What's next?  Go to your health care provider once a year for a well check visit.  Ask your health care provider how often you should have your eyes and teeth checked.  Stay up to date on all vaccines. This information is not intended to replace advice given to you by your health care provider. Make sure you discuss any questions you have with your health care provider. Document Revised: 07/12/2018 Document Reviewed: 07/12/2018 Elsevier Patient Education  2020 [employer].

## 2020-02-12 NOTE — Progress Notes (Signed)
Presents today for [employer] Visit   Date of last exam: 01-08-2020  Interpreter used for this visit? No  I connected with  Barbara Soto on 02/12/20 by a telephone application and verified that I am speaking with the correct person using two identifiers.   I discussed the limitations of evaluation and management by telemedicine. The patient expressed understanding and agreed to proceed.   Patient Care Team: Rutherford Guys, MD as PCP - General (Family Medicine)   Other items to address today:   Discussed Eye/Dental Discussed immunizations   Other Screening: Last screening for diabetes: 02-11-2019 Last lipid screening:02/11/2019  ADVANCE DIRECTIVES: Discussed:  yes On File: no Materials Provided: no  Immunization status:  Immunization History  Administered Date(s) Administered  . PFIZER SARS-COV-2 Vaccination 09/29/2019, 10/22/2019     Health Maintenance Due  Topic Date Due  . Hepatitis C Screening  Never done  . TETANUS/TDAP  Never done  . DEXA SCAN  Never done  . PNA vac Low Risk Adult (1 of 2 - PCV13) Never done     Functional Status Survey: Is the patient deaf or have difficulty hearing?: No Does the patient have difficulty seeing, even when wearing glasses/contacts?: No Does the patient have difficulty concentrating, remembering, or making decisions?: No Does the patient have difficulty walking or climbing stairs?: No Does the patient have difficulty dressing or bathing?: No Does the patient have difficulty doing errands alone such as visiting a doctor's office or shopping?: No   6CIT Screen 02/12/2020 02/11/2019  What Year? 0 points 0 points  What month? 0 points -  What time? 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  Cooking breakfast / get dressed helps with medication    Patient Active Problem List   Diagnosis Date Noted  . Status post right hip replacement 10/17/2017  . Nausea and vomiting 09/05/2017  . Anemia 09/05/2017  . Acute blood loss as cause of postoperative anemia 09/01/2017  . Primary osteoarthritis of right hip 08/29/2017  . Essential hypertension 07/22/2009  . GALLBLADDER DISEASE 07/22/2009  . ARTHRITIS 07/22/2009     Past Medical History:  Diagnosis Date  . Complication of anesthesia   . DJD (degenerative joint disease)    right hip  . Hypertension   . PONV (postoperative nausea and vomiting)   . Wears dentures   . Wears glasses      Past Surgical History:  Procedure Laterality Date  . ABDOMINAL HYSTERECTOMY    . APPENDECTOMY    . CATARACT EXTRACTION W/ INTRAOCULAR LENS  IMPLANT, BILATERAL    . CHOLECYSTECTOMY    . DILATION AND CURETTAGE OF UTERUS    . MULTIPLE TOOTH EXTRACTIONS    . TOTAL HIP ARTHROPLASTY Right 08/29/2017   Procedure: TOTAL HIP ARTHROPLASTY ANTERIOR APPROACH;  Surgeon: Melrose Nakayama, MD;  Location: [place];  Service: Orthopedics;  Laterality: Right;     Family History  Problem Relation Age of Onset  . Diabetes Mother  Social History   Socioeconomic History  . Marital status: Widowed    Spouse name: Not on file  . Number of children: Not on file  . Years of education: Not on file  . Highest education level: Not on file  Occupational History  . Not on file  Tobacco Use  . Smoking status: Never Smoker  . Smokeless tobacco: Never Used  Vaping Use  . Vaping Use: Never assessed  Substance and Sexual Activity  . Alcohol use: No  . Drug use: No  . Sexual activity: Yes    Birth control/protection: Surgical  Other Topics Concern  . Not on file   Social History Narrative  . Not on file   Social Determinants of Health   Financial Resource Strain:   . Difficulty of Paying Living Expenses:   Food Insecurity:   . Worried About Charity fundraiser in the Last Year:   . Arboriculturist in the Last Year:   Transportation Needs:   . Film/video editor (Medical):   Marland Kitchen Lack of Transportation (Non-Medical):   Physical Activity:   . Days of Exercise per Week:   . Minutes of Exercise per Session:   Stress:   . Feeling of Stress :   Social Connections:   . Frequency of Communication with Friends and Family:   . Frequency of Social Gatherings with Friends and Family:   . Attends Religious Services:   . Active Member of Clubs or Organizations:   . Attends Archivist Meetings:   Marland Kitchen Marital Status:   Intimate Partner Violence:   . Fear of Current or Ex-Partner:   . Emotionally Abused:   Marland Kitchen Physically Abused:   . Sexually Abused:      Allergies  Allergen Reactions  . Adhesive [Tape] Other (See Comments)    Takes skin off   . Penicillins Rash and Other (See Comments)    PATIENT HAS HAD A PCN REACTION WITH IMMEDIATE RASH, FACIAL/TONGUE/THROAT SWELLING, SOB, OR LIGHTHEADEDNESS WITH HYPOTENSION:  #  #  #  YES  #  #  #   Has patient had a PCN reaction causing severe rash involving mucus membranes or skin necrosis: No Has patient had a PCN reaction that required hospitalization: No Has patient had a PCN reaction occurring within the last 10 years: No   . Latex Other (See Comments)    unlisted     Prior to Admission medications   Medication Sig Start Date End Date Taking? Authorizing Provider  HYDROcodone-acetaminophen (NORCO/VICODIN) 5-325 MG tablet Take 1 tablet by mouth every 6 (six) hours as needed for moderate pain. 01/08/20  Yes Maximiano Coss, NP  zolpidem (AMBIEN) 5 MG tablet Take 1 tablet (5 mg total) by mouth at bedtime as needed for sleep. 01/08/20  Yes Maximiano Coss, NP  acetic acid 2 % otic solution Place 4  drops into both ears every 3 (three) hours. Patient not taking: Reported on 10/16/2019 02/11/19   Rutherford Guys, MD  hydrocortisone (ANUSOL-HC) 2.5 % rectal cream Place 1 application rectally 2 (two) times daily. Patient not taking: Reported on 10/16/2019 02/11/19   Rutherford Guys, MD  nystatin (MYCOSTATIN) 100000 UNIT/ML suspension Take 5 mLs (500,000 Units total) by mouth 4 (four) times daily. Patient not taking: Reported on 01/08/2020 11/07/19   Rutherford Guys, MD     Depression screen [facility] 2/9 02/12/2020 01/08/2020 11/06/2019 10/16/2019 07/12/2018  Decreased Interest 0 0 0 0 0  Down, Depressed, Hopeless 0 0  0 0 0  PHQ - 2 Score 0 0 0 0 0     Fall Risk  02/12/2020 01/08/2020 11/06/2019 10/16/2019 03/15/2019  Falls in the past year? 0 0 0 1 0  Number falls in past yr: 1 0 0 1 0  Injury with Fall? 0 0 0 1 0  Comment fell on porch shoe got stuck and fell - - - -  Risk for fall due to : - - - - History of fall(s)  Follow up Falls evaluation completed;Education provided Falls evaluation completed Falls evaluation completed Falls evaluation completed Falls evaluation completed      PHYSICAL EXAM: Ht 5\' 1"  (1.549 m)   Wt 229 lb (103.9 kg)   BMI 43.27 kg/m    Wt Readings from Last 3 Encounters:  02/12/20 229 lb (103.9 kg)  01/08/20 229 lb 3.2 oz (104 kg)  03/15/19 229 lb 3.2 oz (104 kg)      Education/Counseling provided regarding diet and exercise, prevention of chronic diseases, smoking/tobacco cessation, if applicable, and reviewed "Covered Medicare Preventive Services."

## 2020-03-24 ENCOUNTER — Encounter: Payer: Self-pay | Admitting: Family Medicine

## 2020-03-24 ENCOUNTER — Other Ambulatory Visit: Payer: Self-pay

## 2020-03-24 ENCOUNTER — Ambulatory Visit (INDEPENDENT_AMBULATORY_CARE_PROVIDER_SITE_OTHER): Payer: PPO | Admitting: Family Medicine

## 2020-03-24 VITALS — BP 155/85 | HR 85 | Temp 97.8°F | Ht 61.0 in | Wt 225.8 lb

## 2020-03-24 DIAGNOSIS — F5104 Psychophysiologic insomnia: Secondary | ICD-10-CM | POA: Diagnosis not present

## 2020-03-24 DIAGNOSIS — B029 Zoster without complications: Secondary | ICD-10-CM | POA: Diagnosis not present

## 2020-03-24 DIAGNOSIS — R030 Elevated blood-pressure reading, without diagnosis of hypertension: Secondary | ICD-10-CM | POA: Diagnosis not present

## 2020-03-24 MED ORDER — VALACYCLOVIR HCL 1 G PO TABS: 1000.0000 mg | ORAL_TABLET | Freq: Three times a day (TID) | ORAL | 0 refills | Status: DC

## 2020-03-24 MED ORDER — ZOLPIDEM TARTRATE 5 MG PO TABS: 5.0000 mg | ORAL_TABLET | Freq: Every evening | ORAL | 2 refills | Status: DC | PRN

## 2020-03-24 MED ORDER — GABAPENTIN 400 MG PO CAPS: 400.0000 mg | ORAL_CAPSULE | Freq: Three times a day (TID) | ORAL | 0 refills | Status: DC

## 2020-03-24 NOTE — Patient Instructions (Signed)
° ° ° °  If you have lab work done today you will be contacted with your lab results within the next 2 weeks.  If you have not heard from us then please contact us. The fastest way to get your results is to register for My Chart. ° ° °IF you received an x-ray today, you will receive an invoice from [facility]. Please contact [facility] at [phone] with questions or concerns regarding your invoice.  ° °IF you received labwork today, you will receive an invoice from [facility]. Please contact [facility] at [phone] with questions or concerns regarding your invoice.  ° °Our billing staff will not be able to assist you with questions regarding bills from these companies. ° °You will be contacted with the lab results as soon as they are available. The fastest way to get your results is to activate your My Chart account. Instructions are located on the last page of this paperwork. If you have not heard from us regarding the results in 2 weeks, please contact this office. °  ° ° ° °

## 2020-03-24 NOTE — Progress Notes (Signed)
8/24/20212:40 PM  Barbara Soto May 23, 1941, 79 y.o., female [id number]  Chief Complaint  Patient presents with  . Rash    rash on chest that is starting to move up the left side of the neck. It is burning as well    HPI:   Patient is a 79 y.o. female who presents today for rash on chest  5 days of burning rash on left chest wall and neck She has had shingles in that area before She otherwise states her BP is always high at the doctor's office, she checks regularly at local firestation She reports 110s/70s  Patient requesting refill of ambien Takes prn insomnia Has been on it for years, denies any side effects pmp reviewed Helped her sleep last due despite burning pain   Depression screen [facility] 2/9 02/12/2020 01/08/2020 11/06/2019  Decreased Interest 0 0 0  Down, Depressed, Hopeless 0 0 0  PHQ - 2 Score 0 0 0    Fall Risk  02/12/2020 01/08/2020 11/06/2019 10/16/2019 03/15/2019  Falls in the past year? 0 0 0 1 0  Number falls in past yr: 1 0 0 1 0  Injury with Fall? 0 0 0 1 0  Comment fell on porch shoe got stuck and fell - - - -  Risk for fall due to : - - - - History of fall(s)  Follow up Falls evaluation completed;Education provided Falls evaluation completed Falls evaluation completed Falls evaluation completed Falls evaluation completed     Allergies  Allergen Reactions  . Adhesive [Tape] Other (See Comments)    Takes skin off   . Penicillins Rash and Other (See Comments)    PATIENT HAS HAD A PCN REACTION WITH IMMEDIATE RASH, FACIAL/TONGUE/THROAT SWELLING, SOB, OR LIGHTHEADEDNESS WITH HYPOTENSION:  #  #  #  YES  #  #  #   Has patient had a PCN reaction causing severe rash involving mucus membranes or skin necrosis: No Has patient had a PCN reaction that required hospitalization: No Has patient had a PCN reaction occurring within the last 10 years: No   . Latex Other (See Comments)    unlisted    Prior to Admission medications   Medication Sig Start Date End Date  Taking? Authorizing Provider  HYDROcodone-acetaminophen (NORCO/VICODIN) 5-325 MG tablet Take 1 tablet by mouth every 6 (six) hours as needed for moderate pain. 01/08/20  Yes Maximiano Coss, NP  zolpidem (AMBIEN) 5 MG tablet Take 1 tablet (5 mg total) by mouth at bedtime as needed for sleep. 02/12/20  Yes Maximiano Coss, NP    Past Medical History:  Diagnosis Date  . Complication of anesthesia   . DJD (degenerative joint disease)    right hip  . Hypertension   . PONV (postoperative nausea and vomiting)   . Wears dentures   . Wears glasses     Past Surgical History:  Procedure Laterality Date  . ABDOMINAL HYSTERECTOMY    . APPENDECTOMY    . CATARACT EXTRACTION W/ INTRAOCULAR LENS  IMPLANT, BILATERAL    . CHOLECYSTECTOMY    . DILATION AND CURETTAGE OF UTERUS    . MULTIPLE TOOTH EXTRACTIONS    . TOTAL HIP ARTHROPLASTY Right 08/29/2017   Procedure: TOTAL HIP ARTHROPLASTY ANTERIOR APPROACH;  Surgeon: Melrose Nakayama, MD;  Location: [facility];  Service: Orthopedics;  Laterality: Right;    Social History   Tobacco Use  . Smoking status: Never Smoker  . Smokeless tobacco: Never Used  Substance Use Topics  . Alcohol use:  No    Family History  Problem Relation Age of Onset  . Diabetes Mother     ROS Per hpi  OBJECTIVE:  Today's Vitals   03/24/20 1426  BP: (!) 155/85  Pulse: 85  Temp: 97.8 F (36.6 C)  SpO2: 98%  Weight: 225 lb 12.8 oz (102.4 kg)  Height: 5\' 1"  (1.549 m)   Body mass index is 42.66 kg/m.   Physical Exam Skin:          Gen: aaox3, nad   No results found for this or any previous visit (from the past 24 hour(s)).  No results found.   ASSESSMENT and PLAN  1. Herpes zoster without complication Across multiple dermatomes with new vesicles. Discussed supportive measures, new meds r/se/b and RTC precautions. Patient educational handout given.  2. Psychophysiologic insomnia - zolpidem (AMBIEN) 5 MG tablet; Take 1 tablet (5 mg total) by mouth at  bedtime as needed for sleep.  3. White coat syndrome without diagnosis of hypertension - CBC - Comprehensive metabolic panel - Lipid panel - Urinalysis, Routine w reflex microscopic  Other orders - gabapentin (NEURONTIN) 400 MG capsule; Take 1 capsule (400 mg total) by mouth 3 (three) times daily. - valACYclovir (VALTREX) 1000 MG tablet; Take 1 tablet (1,000 mg total) by mouth 3 (three) times daily.  Return if symptoms worsen or fail to improve.    Rutherford Guys, MD [facility] at [facility], [place] [address] Ph.  [phone] Fax [phone]

## 2020-03-25 ENCOUNTER — Telehealth: Payer: Self-pay | Admitting: Family Medicine

## 2020-03-25 LAB — COMPREHENSIVE METABOLIC PANEL
ALT: 25 IU/L (ref 0–32)
AST: 47 IU/L — ABNORMAL HIGH (ref 0–40)
Albumin/Globulin Ratio: 1.8 (ref 1.2–2.2)
Albumin: 4.7 g/dL (ref 3.7–4.7)
Alkaline Phosphatase: 100 IU/L (ref 48–121)
BUN/Creatinine Ratio: 13 (ref 12–28)
BUN: 11 mg/dL (ref 8–27)
Bilirubin Total: 0.9 mg/dL (ref 0.0–1.2)
CO2: 27 mmol/L (ref 20–29)
Calcium: 9.2 mg/dL (ref 8.7–10.3)
Chloride: 104 mmol/L (ref 96–106)
Creatinine, Ser: 0.88 mg/dL (ref 0.57–1.00)
GFR calc Af Amer: 73 mL/min/{1.73_m2} (ref >59)
GFR calc non Af Amer: 64 mL/min/{1.73_m2} (ref >59)
Globulin, Total: 2.6 g/dL (ref 1.5–4.5)
Glucose: 92 mg/dL (ref 65–99)
Potassium: 4.8 mmol/L (ref 3.5–5.2)
Sodium: 143 mmol/L (ref 134–144)
Total Protein: 7.3 g/dL (ref 6.0–8.5)

## 2020-03-25 LAB — LIPID PANEL
Chol/HDL Ratio: 2.3 ratio (ref 0.0–4.4)
Cholesterol, Total: 160 mg/dL (ref 100–199)
HDL: 71 mg/dL (ref >39)
LDL Chol Calc (NIH): 72 mg/dL (ref 0–99)
Triglycerides: 90 mg/dL (ref 0–149)
VLDL Cholesterol Cal: 17 mg/dL (ref 5–40)

## 2020-03-25 LAB — URINALYSIS, ROUTINE W REFLEX MICROSCOPIC
Bilirubin, UA: NEGATIVE
Glucose, UA: NEGATIVE
Ketones, UA: NEGATIVE
Nitrite, UA: NEGATIVE
Specific Gravity, UA: 1.017 (ref 1.005–1.030)
Urobilinogen, Ur: 0.2 mg/dL (ref 0.2–1.0)
pH, UA: 6 (ref 5.0–7.5)

## 2020-03-25 LAB — CBC
Hematocrit: 37.6 % (ref 34.0–46.6)
Hemoglobin: 12.4 g/dL (ref 11.1–15.9)
MCH: 29.1 pg (ref 26.6–33.0)
MCHC: 33 g/dL (ref 31.5–35.7)
MCV: 88 fL (ref 79–97)
Platelets: 211 10*3/uL (ref 150–450)
RBC: 4.26 x10E6/uL (ref 3.77–5.28)
RDW: 13.7 % (ref 11.7–15.4)
WBC: 6.2 10*3/uL (ref 3.4–10.8)

## 2020-03-25 LAB — MICROSCOPIC EXAMINATION
Casts: NONE SEEN /lpf
Epithelial Cells (non renal): >10 /hpf — AB (ref 0–10)

## 2020-03-25 NOTE — Telephone Encounter (Signed)
Pt called and stated the medication that she was proscribed to help her shingles is making her sick (nausus/ vomit). Pt stated she does better with hydrocodone. Pt would like sent in until her shingles go away. Pt would like a call. Please advise.

## 2020-03-25 NOTE — Telephone Encounter (Signed)
I have attempted to call pt and there was no answer. I left a message to call back.   Also is the hydrocodone medication appropriate to help manage shingles pain?

## 2020-03-26 NOTE — Telephone Encounter (Signed)
Left a msg on patient machine to return call to let us know which medication below is making her sick?

## 2020-03-26 NOTE — Telephone Encounter (Signed)
Pt calling back regarding this request

## 2020-03-26 NOTE — Telephone Encounter (Signed)
Which medication is making her sick? Valacyclovir is the actual antiviral. This helps suppress shingles. Gabapentin is for burning pain. Please clarify. thanks

## 2020-03-27 NOTE — Telephone Encounter (Signed)
Pt. Called back. It would seem the medication she is referring to is the gabapentin. Pt. Barbara Soto it was the 400mg  3x daily.

## 2020-03-31 ENCOUNTER — Ambulatory Visit: Payer: PPO | Admitting: Dermatology

## 2020-04-21 ENCOUNTER — Encounter: Payer: Self-pay | Admitting: Registered Nurse

## 2020-04-21 NOTE — Progress Notes (Signed)
Established Patient Office Visit  Subjective:  Patient ID: Barbara Soto, female    DOB: January 28, 1941  Age: 79 y.o. MRN: [id number]  CC:  Chief Complaint  Patient presents with  . Back Pain    Pt stated that she fell on her pourch about 3/4 days ago and hurt her back.    HPI Barbara Soto presents for mechanical fall  On porch, slipped and landed on lower back  Acute pain since. No radiation down legs. No saddle symptoms. Has high pain tolerance, requesting opioid pain relief  Denies headache, chest pain, shob, doe. Interested in dg lumbar spine, has had one before - wants to ensure no acute changes.   Past Medical History:  Diagnosis Date  . Complication of anesthesia   . DJD (degenerative joint disease)    right hip  . Hypertension   . PONV (postoperative nausea and vomiting)   . Wears dentures   . Wears glasses     Past Surgical History:  Procedure Laterality Date  . ABDOMINAL HYSTERECTOMY    . APPENDECTOMY    . CATARACT EXTRACTION W/ INTRAOCULAR LENS  IMPLANT, BILATERAL    . CHOLECYSTECTOMY    . DILATION AND CURETTAGE OF UTERUS    . MULTIPLE TOOTH EXTRACTIONS    . TOTAL HIP ARTHROPLASTY Right 08/29/2017   Procedure: TOTAL HIP ARTHROPLASTY ANTERIOR APPROACH;  Surgeon: Melrose Nakayama, MD;  Location: [facility];  Service: Orthopedics;  Laterality: Right;    Family History  Problem Relation Age of Onset  . Diabetes Mother     Social History   Socioeconomic History  . Marital status: Widowed    Spouse name: Not on file  . Number of children: Not on file  . Years of education: Not on file  . Highest education level: Not on file  Occupational History  . Not on file  Tobacco Use  . Smoking status: Never Smoker  . Smokeless tobacco: Never Used  Vaping Use  . Vaping Use: Never assessed  Substance and Sexual Activity  . Alcohol use: No  . Drug use: No  . Sexual activity: Yes    Birth control/protection: Surgical  Other Topics Concern  . Not on file   Social History Narrative  . Not on file   Social Determinants of Health   Financial Resource Strain:   . Difficulty of Paying Living Expenses: Not on file  Food Insecurity:   . Worried About Charity fundraiser in the Last Year: Not on file  . Ran Out of Food in the Last Year: Not on file  Transportation Needs:   . Lack of Transportation (Medical): Not on file  . Lack of Transportation (Non-Medical): Not on file  Physical Activity:   . Days of Exercise per Week: Not on file  . Minutes of Exercise per Session: Not on file  Stress:   . Feeling of Stress : Not on file  Social Connections:   . Frequency of Communication with Friends and Family: Not on file  . Frequency of Social Gatherings with Friends and Family: Not on file  . Attends Religious Services: Not on file  . Active Member of Clubs or Organizations: Not on file  . Attends Archivist Meetings: Not on file  . Marital Status: Not on file  Intimate Partner Violence:   . Fear of Current or Ex-Partner: Not on file  . Emotionally Abused: Not on file  . Physically Abused: Not on file  . Sexually Abused: Not  on file    Outpatient Medications Prior to Visit  Medication Sig Dispense Refill  . acetic acid 2 % otic solution Place 4 drops into both ears every 3 (three) hours. (Patient not taking: Reported on 10/16/2019) 15 mL 0  . HYDROcodone-acetaminophen (NORCO/VICODIN) 5-325 MG tablet Take 1 tablet by mouth every 6 (six) hours as needed for moderate pain. (Patient not taking: Reported on 10/16/2019) 30 tablet 0  . hydrocortisone (ANUSOL-HC) 2.5 % rectal cream Place 1 application rectally 2 (two) times daily. (Patient not taking: Reported on 10/16/2019) 30 g 2  . nystatin (MYCOSTATIN) 100000 UNIT/ML suspension Take 5 mLs (500,000 Units total) by mouth 4 (four) times daily. (Patient not taking: Reported on 01/08/2020) 60 mL 2  . zolpidem (AMBIEN) 5 MG tablet Take 1 tablet (5 mg total) by mouth at bedtime as needed for sleep.  (Patient not taking: Reported on 01/08/2020) 15 tablet 1   No facility-administered medications prior to visit.    Allergies  Allergen Reactions  . Adhesive [Tape] Other (See Comments)    Takes skin off   . Penicillins Rash and Other (See Comments)    PATIENT HAS HAD A PCN REACTION WITH IMMEDIATE RASH, FACIAL/TONGUE/THROAT SWELLING, SOB, OR LIGHTHEADEDNESS WITH HYPOTENSION:  #  #  #  YES  #  #  #   Has patient had a PCN reaction causing severe rash involving mucus membranes or skin necrosis: No Has patient had a PCN reaction that required hospitalization: No Has patient had a PCN reaction occurring within the last 10 years: No   . Latex Other (See Comments)    unlisted    ROS Review of Systems  Constitutional: Negative.   HENT: Negative.   Eyes: Negative.   Respiratory: Negative.   Cardiovascular: Negative.   Gastrointestinal: Negative.   Genitourinary: Negative.   Musculoskeletal: Positive for back pain.  Skin: Negative.   Neurological: Negative.   Psychiatric/Behavioral: Negative.       Objective:    Physical Exam Vitals and nursing note reviewed.  Constitutional:      General: She is not in acute distress.    Appearance: Normal appearance. She is normal weight. She is not ill-appearing, toxic-appearing or diaphoretic.  Cardiovascular:     Rate and Rhythm: Normal rate and regular rhythm.     Heart sounds: Normal heart sounds. No murmur heard.  No friction rub. No gallop.   Pulmonary:     Effort: Pulmonary effort is normal. No respiratory distress.     Breath sounds: Normal breath sounds. No stridor. No wheezing, rhonchi or rales.  Chest:     Chest wall: No tenderness.  Musculoskeletal:        General: Tenderness and signs of injury present.     Comments: Limited ROM in lumbar spine  Skin:    General: Skin is warm and dry.  Neurological:     General: No focal deficit present.     Mental Status: She is alert and oriented to person, place, and time. Mental  status is at baseline.  Psychiatric:        Mood and Affect: Mood normal.        Behavior: Behavior normal.        Thought Content: Thought content normal.        Judgment: Judgment normal.     BP (!) 172/94 (BP Location: Right Arm, Patient Position: Sitting, Cuff Size: Normal)   Pulse 87   Temp 97.6 F (36.4 C) (Temporal)   Ht 5'  1" (1.549 m)   Wt 229 lb 3.2 oz (104 kg)   SpO2 96%   BMI 43.31 kg/m  Wt Readings from Last 3 Encounters:  03/24/20 225 lb 12.8 oz (102.4 kg)  02/12/20 229 lb (103.9 kg)  01/08/20 229 lb 3.2 oz (104 kg)     Health Maintenance Due  Topic Date Due  . DEXA SCAN  Never done  . PNA vac Low Risk Adult (1 of 2 - PCV13) Never done    There are no preventive care reminders to display for this patient.  Lab Results  Component Value Date   TSH 1.500 10/19/2017   Lab Results  Component Value Date   WBC 6.2 03/24/2020   HGB 12.4 03/24/2020   HCT 37.6 03/24/2020   MCV 88 03/24/2020   PLT 211 03/24/2020   Lab Results  Component Value Date   NA 143 03/24/2020   K 4.8 03/24/2020   CO2 27 03/24/2020   GLUCOSE 92 03/24/2020   BUN 11 03/24/2020   CREATININE 0.88 03/24/2020   BILITOT 0.9 03/24/2020   ALKPHOS 100 03/24/2020   AST 47 (H) 03/24/2020   ALT 25 03/24/2020   PROT 7.3 03/24/2020   ALBUMIN 4.7 03/24/2020   CALCIUM 9.2 03/24/2020   ANIONGAP 7 09/06/2017   Lab Results  Component Value Date   CHOL 160 03/24/2020   Lab Results  Component Value Date   HDL 71 03/24/2020   Lab Results  Component Value Date   LDLCALC 72 03/24/2020   Lab Results  Component Value Date   TRIG 90 03/24/2020   Lab Results  Component Value Date   CHOLHDL 2.3 03/24/2020   No results found for: HGBA1C    Assessment & Plan:   Problem List Items Addressed This Visit    None    Visit Diagnoses    Low back pain, unspecified back pain laterality, unspecified chronicity, unspecified whether sciatica present    -  Primary   Relevant Medications    HYDROcodone-acetaminophen (NORCO/VICODIN) 5-325 MG tablet   Other Relevant Orders   DG Lumbar Spine Complete (Completed)   Psychophysiologic insomnia          Meds ordered this encounter  Medications  . HYDROcodone-acetaminophen (NORCO/VICODIN) 5-325 MG tablet    Sig: Take 1 tablet by mouth every 6 (six) hours as needed for moderate pain.    Dispense:  30 tablet    Refill:  0    Order Specific Question:   Supervising Provider    Answer:   Carlota Raspberry, JEFFREY R [[id number]]  . DISCONTD: zolpidem (AMBIEN) 5 MG tablet    Sig: Take 1 tablet (5 mg total) by mouth at bedtime as needed for sleep.    Dispense:  30 tablet    Refill:  0    Order Specific Question:   Supervising Provider    Answer:   Carlota Raspberry, JEFFREY R [[id number]]    Follow-up: No follow-ups on file.   PLAN  No acute changes on dg lumbar spine, but progressing degenerative changes  norco 5-325mg  PO q6h PRN given  Refill zolpidem   Discussed risks, benefits, and side effects of these medications in relation to the patient's age.  Patient encouraged to call clinic with any questions, comments, or concerns.  Maximiano Coss, NP

## 2020-05-18 DIAGNOSIS — H353132 Nonexudative age-related macular degeneration, bilateral, intermediate dry stage: Secondary | ICD-10-CM | POA: Diagnosis not present

## 2020-05-18 DIAGNOSIS — H18513 Endothelial corneal dystrophy, bilateral: Secondary | ICD-10-CM | POA: Diagnosis not present

## 2020-05-18 DIAGNOSIS — H43812 Vitreous degeneration, left eye: Secondary | ICD-10-CM | POA: Diagnosis not present

## 2020-05-18 DIAGNOSIS — H35373 Puckering of macula, bilateral: Secondary | ICD-10-CM | POA: Diagnosis not present

## 2020-06-08 ENCOUNTER — Other Ambulatory Visit: Payer: Self-pay

## 2020-06-08 ENCOUNTER — Ambulatory Visit: Payer: PPO | Admitting: Dermatology

## 2020-06-08 DIAGNOSIS — L821 Other seborrheic keratosis: Secondary | ICD-10-CM

## 2020-06-08 DIAGNOSIS — C44329 Squamous cell carcinoma of skin of other parts of face: Secondary | ICD-10-CM | POA: Diagnosis not present

## 2020-06-08 DIAGNOSIS — C4492 Squamous cell carcinoma of skin, unspecified: Secondary | ICD-10-CM

## 2020-06-08 DIAGNOSIS — D485 Neoplasm of uncertain behavior of skin: Secondary | ICD-10-CM

## 2020-06-08 HISTORY — DX: Squamous cell carcinoma of skin, unspecified: C44.92

## 2020-06-08 NOTE — Patient Instructions (Signed)

## 2020-06-15 ENCOUNTER — Encounter: Payer: Self-pay | Admitting: *Deleted

## 2020-06-15 ENCOUNTER — Telehealth: Payer: Self-pay | Admitting: Dermatology

## 2020-06-15 NOTE — Telephone Encounter (Signed)
Patient is calling for pathology results from her last visit with Stuart Tafeen, MD. 

## 2020-06-15 NOTE — Telephone Encounter (Signed)
Path to patient she needs MOHS, referral will be sent today.

## 2020-06-15 NOTE — Telephone Encounter (Signed)
Pathology to patient per [facility] - referral sent to skin surgery center for MOHS.

## 2020-06-16 ENCOUNTER — Telehealth: Payer: Self-pay

## 2020-06-16 ENCOUNTER — Telehealth: Payer: Self-pay | Admitting: Dermatology

## 2020-06-16 NOTE — Telephone Encounter (Signed)
Surgeon in [place] can't see her until February.BCC needs to come off.  PLEASE CALL HER. She's very nervous about waiting that long,

## 2020-06-16 NOTE — Telephone Encounter (Signed)
Phone call from [facility] stating that the need the patient's Pathology report and office note.  I informed [facility] that the patient's office note wasn't completed but I would send over the Pathology report.

## 2020-06-16 NOTE — Telephone Encounter (Signed)
Phone call from patient wanting to know if she could see Dr. Winifred Olive instead of waiting until February to see Dr. Link Snuffer?  I informed patient that she can see whichever Doctor is available to see her if she's not wanting to wait until February.  Patient states that she will call [facility] and see if she can get a sooner appointment.

## 2020-06-22 ENCOUNTER — Encounter: Payer: Self-pay | Admitting: Dermatology

## 2020-06-22 NOTE — Progress Notes (Signed)
   Follow-Up Visit   Subjective  Barbara Soto is a 79 y.o. female who presents for the following: Skin Problem (check spot in between breast. tan textured lesion possible SK?Marland Kitchen Also check lesion on left side face. Did have shingles 3 weeks ago. Not sure if its related.  ).  Growths left jawline and chest Location:  Duration:  Quality:  Associated Signs/Symptoms: Modifying Factors:  Severity:  Timing: Context:   Objective  Well appearing patient in no apparent distress; mood and affect are within normal limits.  A focused examination was performed including , Neck, arms, upper chest, back.. Relevant physical exam findings are noted in the Assessment and Plan.   Assessment & Plan    Neoplasm of uncertain behavior of skin Left Parotid Area  Skin / nail biopsy Type of biopsy: tangential   Informed consent: discussed and consent obtained   Timeout: patient name, date of birth, surgical site, and procedure verified   Procedure prep:  Patient was prepped and draped in usual sterile fashion (Non sterile) Prep type:  Chlorhexidine Anesthesia: the lesion was anesthetized in a standard fashion   Anesthetic:  1% lidocaine w/ epinephrine 1-100,000 local infiltration Instrument used: flexible razor blade   Outcome: patient tolerated procedure well   Post-procedure details: wound care instructions given    Specimen 1 - Surgical pathology Differential Diagnosis: scc vs bcc Check Margins: No  Seborrheic keratosis Chest - Medial (Center)  Benign no treatment. ( If stable)      I, Lavonna Monarch, MD, have reviewed all documentation for this visit.  The documentation on 06/22/20 for the exam, diagnosis, procedures, and orders are all accurate and complete.

## 2020-06-24 DIAGNOSIS — C4442 Squamous cell carcinoma of skin of scalp and neck: Secondary | ICD-10-CM | POA: Diagnosis not present

## 2020-08-07 ENCOUNTER — Telehealth (INDEPENDENT_AMBULATORY_CARE_PROVIDER_SITE_OTHER): Payer: PPO | Admitting: Family Medicine

## 2020-08-07 ENCOUNTER — Other Ambulatory Visit: Payer: Self-pay

## 2020-08-07 ENCOUNTER — Encounter: Payer: Self-pay | Admitting: Family Medicine

## 2020-08-07 VITALS — Ht 61.0 in | Wt 220.0 lb

## 2020-08-07 DIAGNOSIS — R059 Cough, unspecified: Secondary | ICD-10-CM

## 2020-08-07 DIAGNOSIS — J309 Allergic rhinitis, unspecified: Secondary | ICD-10-CM

## 2020-08-07 DIAGNOSIS — R062 Wheezing: Secondary | ICD-10-CM | POA: Diagnosis not present

## 2020-08-07 MED ORDER — ALBUTEROL SULFATE HFA 108 (90 BASE) MCG/ACT IN AERS: 1.0000 | INHALATION_SPRAY | RESPIRATORY_TRACT | 0 refills | Status: DC | PRN

## 2020-08-07 MED ORDER — FLUTICASONE PROPIONATE 50 MCG/ACT NA SUSP: 2.0000 | Freq: Every day | NASAL | 6 refills | Status: DC

## 2020-08-07 NOTE — Patient Instructions (Signed)
As your symptoms started after perfume exposure it is probably due to allergies and possibly reactive airway or wheezing from that exposure.  Try Flonase nasal spray 1 to 2 sprays per nostril once per day.  Allegra, Claritin or Zyrtec over-the-counter once per day.  I did write for albuterol inhaler to be used for wheezing up to every 4 hours, but if you require that medication sooner than 4 hours or require that medication more than once or twice per day I do recommend being evaluated through urgent care or emergency room.  If any increasing wheezing, chest pains, worsening shortness of breath, confusion, trouble drinking fluids, or other worsening symptoms be seen at urgent care or emergency room right away.  Follow-up by virtual visit in 3 days as long as you are improving.  We will test for COVID-19, flu and other virus today but until those results are known I do recommend isolating from others and wearing a mask if you have to leave the house.

## 2020-08-07 NOTE — Progress Notes (Signed)
Virtual Visit via Telephone Note  I connected with Barbara Soto on 08/07/20 at 10:55 AM by telephone and verified that I am speaking with the correct person using two identifiers. Patient location:inital call she was at bank- asked for call back later. At home on 2nd call.  My location: office   I discussed the limitations, risks, security and privacy concerns of performing an evaluation and management service by telephone and the availability of in person appointments. I also discussed with the patient that there may be a patient responsible charge related to this service. The patient expressed understanding and agreed to proceed, consent obtained  Chief complaint:  Chief Complaint  Patient presents with  . URI    Pt reports Wheezing, cough, and congestion. Symptoms started 08/05/2020. No known exposure to covid. Pt hasn't had a covid test. Pt is fully vaccinated. Pt reports she has a long history of allergies.    History of Present Illness:  Cough, congestion: Smelled perfume when at grocery store on 08/06/19.  Noticed within 30 mins had nose itching, sneezing, cough, congestion. Chest felt slight tighness today - some wheezing started yesterday. Min yellow mucus since last night, frequent nasal congestion.  Lives by self.  No fever, HA, bodyache.  No known exposure to covid 19, had covid vaccine 2/28 and 10/22/19. Has not received booster yet - will have this Tuesday.  No current inhaler. No hx of asthma.  Hx of allergies prior sinus infections.  Trouble sleeping last night - tight feeling with breathing when in cold. No dyspnea at rest. No chest pain, no leg swelling.   Tx: none.   Patient Active Problem List   Diagnosis Date Noted  . Status post right hip replacement 10/17/2017  . Nausea and vomiting 09/05/2017  . Anemia 09/05/2017  . Acute blood loss as cause of postoperative anemia 09/01/2017  . Primary osteoarthritis of right hip 08/29/2017  . Essential hypertension  07/22/2009  . GALLBLADDER DISEASE 07/22/2009  . ARTHRITIS 07/22/2009   Past Medical History:  Diagnosis Date  . Complication of anesthesia   . DJD (degenerative joint disease)    right hip  . Hypertension   . PONV (postoperative nausea and vomiting)   . Squamous cell carcinoma of skin 06/08/2020   mod diff/ulcerated-Left parotid area (MOHS)  . Wears dentures   . Wears glasses    Past Surgical History:  Procedure Laterality Date  . ABDOMINAL HYSTERECTOMY    . APPENDECTOMY    . CATARACT EXTRACTION W/ INTRAOCULAR LENS  IMPLANT, BILATERAL    . CHOLECYSTECTOMY    . DILATION AND CURETTAGE OF UTERUS    . MULTIPLE TOOTH EXTRACTIONS    . TOTAL HIP ARTHROPLASTY Right 08/29/2017   Procedure: TOTAL HIP ARTHROPLASTY ANTERIOR APPROACH;  Surgeon: Melrose Nakayama, MD;  Location: [facility];  Service: Orthopedics;  Laterality: Right;   Allergies  Allergen Reactions  . Adhesive [Tape] Other (See Comments)    Takes skin off   . Penicillins Rash and Other (See Comments)    PATIENT HAS HAD A PCN REACTION WITH IMMEDIATE RASH, FACIAL/TONGUE/THROAT SWELLING, SOB, OR LIGHTHEADEDNESS WITH HYPOTENSION:  #  #  #  YES  #  #  #   Has patient had a PCN reaction causing severe rash involving mucus membranes or skin necrosis: No Has patient had a PCN reaction that required hospitalization: No Has patient had a PCN reaction occurring within the last 10 years: No   . Latex Other (See Comments)  unlisted   Prior to Admission medications   Medication Sig Start Date End Date Taking? Authorizing Provider  zolpidem (AMBIEN) 5 MG tablet Take 1 tablet (5 mg total) by mouth at bedtime as needed for sleep. 03/24/20  Yes Jacelyn Pi, Lilia Argue, MD  gabapentin (NEURONTIN) 400 MG capsule Take 1 capsule (400 mg total) by mouth 3 (three) times daily. Patient not taking: Reported on 08/07/2020 03/24/20   Jacelyn Pi, Lilia Argue, MD  HYDROcodone-acetaminophen (NORCO/VICODIN) 5-325 MG tablet Take 1 tablet by mouth every 6 (six) hours  as needed for moderate pain. Patient not taking: Reported on 08/07/2020 01/08/20   Maximiano Coss, NP  valACYclovir (VALTREX) 1000 MG tablet Take 1 tablet (1,000 mg total) by mouth 3 (three) times daily. Patient not taking: Reported on 08/07/2020 03/24/20   Jacelyn Pi, Lilia Argue, MD   Social History   Socioeconomic History  . Marital status: Widowed    Spouse name: Not on file  . Number of children: Not on file  . Years of education: Not on file  . Highest education level: Not on file  Occupational History  . Not on file  Tobacco Use  . Smoking status: Never Smoker  . Smokeless tobacco: Never Used  Vaping Use  . Vaping Use: Not on file  Substance and Sexual Activity  . Alcohol use: No  . Drug use: No  . Sexual activity: Yes    Birth control/protection: Surgical  Other Topics Concern  . Not on file  Social History Narrative  . Not on file   Social Determinants of Health   Financial Resource Strain: Not on file  Food Insecurity: Not on file  Transportation Needs: Not on file  Physical Activity: Not on file  Stress: Not on file  Social Connections: Not on file  Intimate Partner Violence: Not on file     Observations/Objective: Vitals:   08/07/20 0908  Weight: 220 lb (99.8 kg)  Height: 5\' 1"  (1.549 m)  speaking in full sentences on phone, no respiratory distress or cough.  No audible wheeze over phone.  Coherent responses, all questions answered with understanding of plan expressed.    Assessment and Plan: Cough - Plan: COVID-19, Flu A+B and RSV  Wheezing - Plan: albuterol (VENTOLIN HFA) 108 (90 Base) MCG/ACT inhaler  Allergic rhinitis, unspecified seasonality, unspecified trigger - Plan: fluticasone (FLONASE) 50 MCG/ACT nasal spray  Initial symptoms after perfume exposure, reactive airway and allergies likely but will check for COVID, flu, RSV.  Trial of Flonase nasal spray, over-the-counter antihistamine, albuterol prescribed temporarily for wheezing but if that is  required more than once or twice per day, or sooner than 4 hours advised urgent care or emergency room eval.  Also urgent care/ER eval if any worsening or new symptoms.  Understanding was expressed.  3-day virtual visit follow-up.  Follow Up Instructions: Patient Instructions  As your symptoms started after perfume exposure it is probably due to allergies and possibly reactive airway or wheezing from that exposure.  Try Flonase nasal spray 1 to 2 sprays per nostril once per day.  Allegra, Claritin or Zyrtec over-the-counter once per day.  I did write for albuterol inhaler to be used for wheezing up to every 4 hours, but if you require that medication sooner than 4 hours or require that medication more than once or twice per day I do recommend being evaluated through urgent care or emergency room.  If any increasing wheezing, chest pains, worsening shortness of breath, confusion, trouble drinking fluids, or  other worsening symptoms be seen at urgent care or emergency room right away.  Follow-up by virtual visit in 3 days as long as you are improving.  We will test for COVID-19, flu and other virus today but until those results are known I do recommend isolating from others and wearing a mask if you have to leave the house.       I discussed the assessment and treatment plan with the patient. The patient was provided an opportunity to ask questions and all were answered. The patient agreed with the plan and demonstrated an understanding of the instructions.   The patient was advised to call back or seek an in-person evaluation if the symptoms worsen or if the condition fails to improve as anticipated.  I provided 21 minutes of non-face-to-face time during this encounter.  Signed,   Merri Ray, MD [facility].  08/07/20

## 2020-08-10 ENCOUNTER — Telehealth: Payer: Self-pay | Admitting: Family Medicine

## 2020-08-10 NOTE — Telephone Encounter (Signed)
What is the name of the medication? albuterol (VENTOLIN HFA) 108 (90 Base) MCG/ACT inhaler [[id number]]    Have you contacted your pharmacy to request a refill? She will need a new script. She is still having trouble breathing, she took a covid test with PCP on 08/07/20 and has not gotten the results yet.   Which pharmacy would you like this sent to? Pharmacy  [id number] - [place], [place] - [address]  [address], [place] [address]  Phone:  [phone] Fax:  [phone]  DEA #:  [id number]     PT WOULD ALSO LIKE TO KNOW IF SHE CAN GET AN X-RAY ON HER CHEST?   Patient notified that their request is being sent to the clinical staff for review and that they should receive a call once it is complete. If they do not receive a call within 72 hours they can check with their pharmacy or our office.

## 2020-08-11 ENCOUNTER — Telehealth: Payer: Self-pay | Admitting: Family Medicine

## 2020-08-11 LAB — COVID-19, FLU A+B AND RSV
Influenza A, NAA: NOT DETECTED
Influenza B, NAA: NOT DETECTED
RSV, NAA: DETECTED — AB
SARS-CoV-2, NAA: NOT DETECTED

## 2020-08-11 NOTE — Telephone Encounter (Signed)
Patient is calling wanting her Covid results as are available      Please advise

## 2020-08-12 ENCOUNTER — Encounter: Payer: Self-pay | Admitting: Dermatology

## 2020-08-12 ENCOUNTER — Other Ambulatory Visit: Payer: Self-pay

## 2020-08-12 ENCOUNTER — Ambulatory Visit: Payer: PPO | Admitting: Dermatology

## 2020-08-12 DIAGNOSIS — L570 Actinic keratosis: Secondary | ICD-10-CM | POA: Diagnosis not present

## 2020-08-12 DIAGNOSIS — Z85828 Personal history of other malignant neoplasm of skin: Secondary | ICD-10-CM | POA: Diagnosis not present

## 2020-08-12 DIAGNOSIS — Z8589 Personal history of malignant neoplasm of other organs and systems: Secondary | ICD-10-CM

## 2020-08-12 DIAGNOSIS — L918 Other hypertrophic disorders of the skin: Secondary | ICD-10-CM | POA: Diagnosis not present

## 2020-08-12 NOTE — Patient Instructions (Signed)
Several issues discussed with Barbara Soto date of birth 1941/02/27.  The site of Mohs surgery along the left angle of the jaw shows a central 3 mm pearly bump and a smaller one superiorly.  These are most likely deep stitches that are coming out and can be watched, but if they do not disappear in the next 3 months she will return for biopsy.  On the left cheek and forehead are 7 small pink crusts that represent minor sun damage and which were treated with liquid nitrogen freeze.  These will swell and may peel in the next week or 2.  Below the left eye is an irritating 2 mm projection called a skin tag; this was snipped at Barbara Soto request (no procedure charge).  Her back examination showed no atypical moles or skin cancer.  If the surgical site along the left neck is smooth in 3 months, she may cancel that visit and I be delighted to see her back in 1 year

## 2020-08-12 NOTE — Telephone Encounter (Signed)
Inhaler has been sent to the pharmacy

## 2020-08-13 NOTE — Telephone Encounter (Signed)
Pt RSV positive others are negative please advise and I can call patient

## 2020-08-13 NOTE — Telephone Encounter (Signed)
patient is calling back in for her results /   Please reach out to patient with results

## 2020-08-14 NOTE — Telephone Encounter (Signed)
Patient called for swab results. Informed patient that she is neg for COVID and Flu. Positive for RSV.  Patient stated that she is depressed and requested medication. Please advise. Patient made appt 09/04/20

## 2020-08-16 ENCOUNTER — Encounter: Payer: Self-pay | Admitting: Dermatology

## 2020-08-16 NOTE — Progress Notes (Signed)
   Follow-Up Visit   Subjective  Barbara Soto is a 80 y.o. female who presents for the following: Skin Problem (Left cheek - few scaly spots).  Check Mohs site plus scaly spots on left cheek Location:  Duration:  Quality:  Associated Signs/Symptoms: Modifying Factors:  Severity:  Timing: Context:   Objective  Well appearing patient in no apparent distress; mood and affect are within normal limits. Objective  Left Anterior Neck: Mohs f/u: Clinically clear except for some probable deep stitch papules.  Objective  Left Malar Cheek: 2 mm pedunculated papule which patient requests removal; removed by Dr Denna Haggard no charge  Objective  Left Posterior Mandible (3): 3 to 4 mm flat pink crust (3)   All sun exposed areas plus back examined.   Assessment & Plan    History of squamous cell carcinoma Left Anterior Neck  Recheck 3 months if papules are not clear.  Skin tag Left Malar Cheek  Scissor snip at patient's request (no procedure charge).  Actinic keratosis (3) Left Posterior Mandible    Destruction of lesion - Left Posterior Mandible Complexity: simple   Destruction method: cryotherapy   Informed consent: discussed and consent obtained   Lesion destroyed using liquid nitrogen: Yes   Cryotherapy cycles:  5 Outcome: patient tolerated procedure well with no complications       I, Lavonna Monarch, MD, have reviewed all documentation for this visit.  The documentation on 08/16/20 for the exam, diagnosis, procedures, and orders are all accurate and complete.

## 2020-08-24 ENCOUNTER — Ambulatory Visit (INDEPENDENT_AMBULATORY_CARE_PROVIDER_SITE_OTHER): Payer: PPO

## 2020-08-24 ENCOUNTER — Encounter: Payer: Self-pay | Admitting: Family Medicine

## 2020-08-24 ENCOUNTER — Ambulatory Visit (INDEPENDENT_AMBULATORY_CARE_PROVIDER_SITE_OTHER): Payer: PPO | Admitting: Family Medicine

## 2020-08-24 ENCOUNTER — Other Ambulatory Visit: Payer: Self-pay

## 2020-08-24 VITALS — BP 146/90 | HR 90 | Temp 98.2°F | Ht 61.0 in | Wt 211.0 lb

## 2020-08-24 DIAGNOSIS — R630 Anorexia: Secondary | ICD-10-CM

## 2020-08-24 DIAGNOSIS — R059 Cough, unspecified: Secondary | ICD-10-CM | POA: Diagnosis not present

## 2020-08-24 DIAGNOSIS — B974 Respiratory syncytial virus as the cause of diseases classified elsewhere: Secondary | ICD-10-CM

## 2020-08-24 DIAGNOSIS — B338 Other specified viral diseases: Secondary | ICD-10-CM

## 2020-08-24 DIAGNOSIS — F329 Major depressive disorder, single episode, unspecified: Secondary | ICD-10-CM

## 2020-08-24 DIAGNOSIS — R4584 Anhedonia: Secondary | ICD-10-CM

## 2020-08-24 MED ORDER — SERTRALINE HCL 25 MG PO TABS: 25.0000 mg | ORAL_TABLET | Freq: Every day | ORAL | 1 refills | Status: DC

## 2020-08-24 MED ORDER — HYDROXYZINE HCL 25 MG PO TABS: 12.5000 mg | ORAL_TABLET | Freq: Three times a day (TID) | ORAL | 0 refills | Status: DC | PRN

## 2020-08-24 NOTE — Patient Instructions (Addendum)
Start sertraline once per day for depression.  See other information below.  I would recommend meeting with a therapist but let me know if you change your mind.  If you are having times of feeling more anxious or stressed I did write hydroxyzine that can be taken at half a pill initially.   That can also be used for sleep.  Do not combine it with Benadryl or other sedating medicines.  That medicine can cause you to be sedated as well as dizzy or unsteady so be careful taking that medicine.   I will check some other lab work today but if you have any increased difficulty with eating, nausea or vomiting please follow-up sooner.  Otherwise recheck in 2 weeks.  Managing Depression, Adult Depression is a mental health condition that affects your thoughts, feelings, and actions. Being diagnosed with depression can bring you relief if you did not know why you have felt or behaved a certain way. It could also leave you feeling overwhelmed with uncertainty about your future. Preparing yourself to manage your symptoms can help you feel more positive about your future. How to manage lifestyle changes Managing stress Stress is your body's reaction to life changes and events, both good and bad. Stress can add to your feelings of depression. Learning to manage your stress can help lessen your feelings of depression. Try some of the following approaches to reducing your stress (stress reduction techniques):  Listen to music that you enjoy and that inspires you.  Try using a meditation app or take a meditation class.  Develop a practice that helps you connect with your spiritual self. Walk in nature, pray, or go to a place of worship.  Do some deep breathing. To do this, inhale slowly through your nose. Pause at the top of your inhale for a few seconds and then exhale slowly, letting your muscles relax.  Practice yoga to help relax and work your muscles. Choose a stress reduction technique that suits your  lifestyle and personality. These techniques take time and practice to develop. Set aside 5-15 minutes a day to do them. Therapists can offer training in these techniques. Other things you can do to manage stress include:  Keeping a stress diary.  Knowing your limits and saying no when you think something is too much.  Paying attention to how you react to certain situations. You may not be able to control everything, but you can change your reaction.  Adding humor to your life by watching funny films or TV shows.  Making time for activities that you enjoy and that relax you.   Medicines Medicines, such as antidepressants, are often a part of treatment for depression.  Talk with your pharmacist or health care provider about all the medicines, supplements, and herbal products that you take, their possible side effects, and what medicines and other products are safe to take together.  Make sure to report any side effects you may have to your health care provider. Relationships Your health care provider may suggest family therapy, couples therapy, or individual therapy as part of your treatment. How to recognize changes Everyone responds differently to treatment for depression. As you recover from depression, you may start to:  Have more interest in doing activities.  Feel less hopeless.  Have more energy.  Overeat less often, or have a better appetite.  Have better mental focus. It is important to recognize if your depression is not getting better or is getting worse. The symptoms you had  in the beginning may return, such as:  Tiredness (fatigue) or low energy.  Eating too much or too little.  Sleeping too much or too little.  Feeling restless, agitated, or hopeless.  Trouble focusing or making decisions.  Unexplained physical complaints.  Feeling irritable, angry, or aggressive. If you or your family members notice these symptoms coming back, let your health care provider  know right away. Follow these instructions at home: Activity  Try to get some form of exercise each day, such as walking, biking, swimming, or lifting weights.  Practice stress reduction techniques.  Engage your mind by taking a class or doing some volunteer work.   Lifestyle  Get the right amount and quality of sleep.  Cut down on using caffeine, tobacco, alcohol, and other potentially harmful substances.  Eat a healthy diet that includes plenty of vegetables, fruits, whole grains, low-fat dairy products, and lean protein. Do not eat a lot of foods that are high in solid fats, added sugars, or salt (sodium). General instructions  Take over-the-counter and prescription medicines only as told by your health care provider.  Keep all follow-up visits as told by your health care provider. This is important. Where to find support Talking to others Friends and family members can be sources of support and guidance. Talk to trusted friends or family members about your condition. Explain your symptoms to them, and let them know that you are working with a health care provider to treat your depression. Tell friends and family members how they also can be helpful.   Finances  Find appropriate mental health providers that fit with your financial situation.  Talk with your health care provider about options to get reduced prices on your medicines. Where to find more information You can find support in your area from:  Anxiety and Depression Association of America (ADAA): www.adaa.org  Mental Health America: www.mentalhealthamerica.net  [employer] on Mental Illness: www.nami.org Contact a health care provider if:  You stop taking your antidepressant medicines, and you have any of these symptoms: ? Nausea. ? Headache. ? Light-headedness. ? Chills and body aches. ? Not being able to sleep (insomnia).  You or your friends and family think your depression is getting worse. Get  help right away if:  You have thoughts of hurting yourself or others. If you ever feel like you may hurt yourself or others, or have thoughts about taking your own life, get help right away. Go to your nearest emergency department or:  Call your local emergency services (911 in the [place]  Call a suicide crisis helpline, such as the [facility] at 435-253-3650. This is open 24 hours a day in the [place]  Text the Crisis Text Line at 551 391 6083 (in the [place].). Summary  If you are diagnosed with depression, preparing yourself to manage your symptoms is a good way to feel positive about your future.  Work with your health care provider on a management plan that includes stress reduction techniques, medicines (if applicable), therapy, and healthy lifestyle habits.  Keep talking with your health care provider about how your treatment is working.  If you have thoughts about taking your own life, call a suicide crisis helpline or text a crisis text line. This information is not intended to replace advice given to you by your health care provider. Make sure you discuss any questions you have with your health care provider. Document Revised: 05/29/2019 Document Reviewed: 05/29/2019 Elsevier Patient Education  2021 [employer].   If  you have lab work done today you will be contacted with your lab results within the next 2 weeks.  If you have not heard from [place] then please contact us. The fastest way to get your results is to register for My Chart.   IF you received an x-ray today, you will receive an invoice from [facility]. Please contact [facility] at [phone] with questions or concerns regarding your invoice.   IF you received labwork today, you will receive an invoice from [place] [facility]. Please contact [facility] at [phone] with questions or concerns regarding your invoice.   Our billing staff will not be able to assist you with questions regarding  bills from these companies.  You will be contacted with the lab results as soon as they are available. The fastest way to get your results is to activate your My Chart account. Instructions are located on the last page of this paperwork. If you have not heard from [place] regarding the results in 2 weeks, please contact this office.

## 2020-08-24 NOTE — Progress Notes (Signed)
Subjective:  Patient ID: Jorge Mandril, female    DOB: 1941/06/28  Age: 80 y.o. MRN: [id number]  CC:  Chief Complaint  Patient presents with  . Depression    Pt reports she has noticed feeling depressed since starting albuterol. Pt has stopped taking the albuterol since noticing the depression. Pt reports still feeling depressed even after stopping the medication. PHQ-9 Score of 10. Pt reports loosing a brother and a sister recently and only a few months apart.    HPI ANGELAMARIE AVAKIAN presents for   Cough, RSV infection.  Discussed by telemedicine January 7.  Noted after exposure to perfume.  possible reactive airway.  Covid, flu testing was negative, RSV positive.  Albuterol inhaler was prescribed with RTC/ER precautions.  No recent wheeze, dyspnea. Improved within 3-4 days pf last visit. Still some cough with yellow phlegm.  Has not taken prednisone recently.  nasal d/c and yellow phlegm. Does report feeling depressed few days after becoming sick. Being snowed in did not help. This may have worsened depression.  Denies prior depression/anxiety.  Valium in past when separated form husband since 1981. Feels like albuterol made her depressed.  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 Slight concern with how she will pay bills.  Decreased appetite - feels nauseous if eating, feels like going to be sick is swallowing food, no vomiting. No abd pain.  Anhedonia. Tired at times. No dizziness. Only since sick. No chest pains.  Trouble sleeping in past - tx benadryl and alleve helps some. Has been using more lately. No recent therapy No change in depression since stopping albuterol. Sister passed away in 02/26/23 of last year, brother passed away in 2023/06/29.  Her older sister who has dementia is also not doing well, and may be passing soon.   Results for orders placed or performed in visit on 08/07/20  COVID-19, Flu A+B and RSV   Specimen: Nasal Swab  Result Value Ref Range    SARS-CoV-2, NAA Not Detected Not Detected   Influenza A, NAA Not Detected Not Detected   Influenza B, NAA Not Detected Not Detected   RSV, NAA Detected (A) Not Detected    Depression screen [facility] 2/9 08/24/2020 08/07/2020 02/12/2020 01/08/2020 11/06/2019  Decreased Interest 0 0 0 0 0  Down, Depressed, Hopeless 1 0 0 0 0  PHQ - 2 Score 1 0 0 0 0  Altered sleeping 3 - - - -  Tired, decreased energy 3 - - - -  Change in appetite 3 - - - -  Feeling bad or failure about yourself  0 - - - -  Trouble concentrating 0 - - - -  Moving slowly or fidgety/restless 0 - - - -  Suicidal thoughts 0 - - - -  PHQ-9 Score 10 - - - -     History Patient Active Problem List   Diagnosis Date Noted  . Status post right hip replacement 10/17/2017  . Nausea and vomiting 09/05/2017  . Anemia 09/05/2017  . Acute blood loss as cause of postoperative anemia 09/01/2017  . Primary osteoarthritis of right hip 08/29/2017  . Essential hypertension 07/22/2009  . GALLBLADDER DISEASE 07/22/2009  . ARTHRITIS 07/22/2009   Past Medical History:  Diagnosis Date  . Complication of anesthesia   . DJD (degenerative joint disease)    right hip  . Hypertension   . PONV (postoperative nausea and vomiting)   . Squamous cell carcinoma of skin 06/08/2020   mod diff/ulcerated-Left  parotid area (MOHS)  . Wears dentures   . Wears glasses    Past Surgical History:  Procedure Laterality Date  . ABDOMINAL HYSTERECTOMY    . APPENDECTOMY    . CATARACT EXTRACTION W/ INTRAOCULAR LENS  IMPLANT, BILATERAL    . CHOLECYSTECTOMY    . DILATION AND CURETTAGE OF UTERUS    . MULTIPLE TOOTH EXTRACTIONS    . TOTAL HIP ARTHROPLASTY Right 08/29/2017   Procedure: TOTAL HIP ARTHROPLASTY ANTERIOR APPROACH;  Surgeon: Melrose Nakayama, MD;  Location: [facility];  Service: Orthopedics;  Laterality: Right;   Allergies  Allergen Reactions  . Adhesive [Tape] Other (See Comments)    Takes skin off   . Penicillins Rash and Other (See Comments)    PATIENT  HAS HAD A PCN REACTION WITH IMMEDIATE RASH, FACIAL/TONGUE/THROAT SWELLING, SOB, OR LIGHTHEADEDNESS WITH HYPOTENSION:  #  #  #  YES  #  #  #   Has patient had a PCN reaction causing severe rash involving mucus membranes or skin necrosis: No Has patient had a PCN reaction that required hospitalization: No Has patient had a PCN reaction occurring within the last 10 years: No   . Latex Other (See Comments)    unlisted   Prior to Admission medications   Medication Sig Start Date End Date Taking? Authorizing Provider  aspirin EC 81 MG tablet Take 81 mg by mouth daily. Swallow whole.   Yes [provider]  albuterol (VENTOLIN HFA) 108 (90 Base) MCG/ACT inhaler Inhale 1-2 puffs into the lungs every 4 (four) hours as needed for wheezing or shortness of breath. Patient not taking: Reported on 08/24/2020 08/07/20   Wendie Agreste, MD  fluticasone [facility]) 50 MCG/ACT nasal spray Place 2 sprays into both nostrils daily. Patient not taking: No sig reported 08/07/20   Wendie Agreste, MD  zolpidem (AMBIEN) 5 MG tablet Take 1 tablet (5 mg total) by mouth at bedtime as needed for sleep. Patient not taking: No sig reported 03/24/20   Jacelyn Pi, Lilia Argue, MD   Social History   Socioeconomic History  . Marital status: Widowed    Spouse name: Not on file  . Number of children: Not on file  . Years of education: Not on file  . Highest education level: Not on file  Occupational History  . Not on file  Tobacco Use  . Smoking status: Never Smoker  . Smokeless tobacco: Never Used  Vaping Use  . Vaping Use: Not on file  Substance and Sexual Activity  . Alcohol use: No  . Drug use: No  . Sexual activity: Yes    Birth control/protection: Surgical  Other Topics Concern  . Not on file  Social History Narrative  . Not on file   Social Determinants of Health   Financial Resource Strain: Not on file  Food Insecurity: Not on file  Transportation Needs: Not on file  Physical Activity: Not on  file  Stress: Not on file  Social Connections: Not on file  Intimate Partner Violence: Not on file    Review of Systems Per HPI.   Objective:   Vitals:   08/24/20 1128 08/24/20 1342  BP: (!) 174/98 (!) 146/90  Pulse: 90   Temp: 98.2 F (36.8 C)   TempSrc: Temporal   SpO2: 98%   Weight: 211 lb (95.7 kg)   Height: 5\' 1"  (1.549 m)     Physical Exam Vitals reviewed.  Constitutional:      Appearance: She is well-developed and well-nourished.  HENT:     Head: Normocephalic and atraumatic.  Eyes:     Extraocular Movements: EOM normal.     Conjunctiva/sclera: Conjunctivae normal.     Pupils: Pupils are equal, round, and reactive to light.  Neck:     Vascular: No carotid bruit.  Cardiovascular:     Rate and Rhythm: Normal rate and regular rhythm.     Pulses: Intact distal pulses.     Heart sounds: Normal heart sounds.  Pulmonary:     Effort: Pulmonary effort is normal.     Breath sounds: Normal breath sounds.  Abdominal:     Palpations: Abdomen is soft. There is no pulsatile mass.     Tenderness: There is no abdominal tenderness.  Skin:    General: Skin is warm and dry.  Neurological:     Mental Status: She is alert and oriented to person, place, and time.  Psychiatric:        Mood and Affect: Mood and affect normal. Affect is tearful.        Behavior: Behavior normal.        Thought Content: Thought content does not include homicidal or suicidal ideation.      DG Chest 2 View  Result Date: 08/24/2020 CLINICAL DATA:  Cough EXAM: CHEST - 2 VIEW COMPARISON:  03/07/2018 FINDINGS: The heart size and mediastinal contours are within normal limits. Both lungs are clear. The visualized skeletal structures are unremarkable. IMPRESSION: No active cardiopulmonary disease. Electronically Signed   By: Inez Catalina M.D.   On: 08/24/2020 13:39    Assessment & Plan:  MILLENNIUM SIEKER is a 80 y.o. female . Decreased appetite - Plan: Comprehensive metabolic panel Reactive  depression - Plan: sertraline (ZOLOFT) 25 MG tablet, TSH, hydrOXYzine (ATARAX/VISTARIL) 25 MG tablet Anhedonia - Plan: TSH  -Initially appear to be adjustment disorder with recent illness, but suspect some underlying symptoms with deaths in the family last year along with recent illness. Unlikely albuterol contribution to depression symptoms, but off that medication at this time. Counseling discussed but declined at this time. Will start sertraline low-dose, hydroxyzine if needed for breakthrough anxiety symptoms or sleep, with potential side effects discussed. Recheck next few weeks. Handout given.  Cough - Plan: Comprehensive metabolic panel, CBC, DG Chest 2 View RSV infection   -Improved symptoms, off albuterol. Chest x-ray reassuring as above. RTC precautions.   Meds ordered this encounter  Medications  . sertraline (ZOLOFT) 25 MG tablet    Sig: Take 1 tablet (25 mg total) by mouth daily.    Dispense:  30 tablet    Refill:  1  . hydrOXYzine (ATARAX/VISTARIL) 25 MG tablet    Sig: Take 0.5-1 tablets (12.5-25 mg total) by mouth 3 (three) times daily as needed for anxiety (or sleep).    Dispense:  30 tablet    Refill:  0   Patient Instructions   Start sertraline once per day for depression.  See other information below.  I would recommend meeting with a therapist but let me know if you change your mind.  If you are having times of feeling more anxious or stressed I did write hydroxyzine that can be taken at half a pill initially.   That can also be used for sleep.  Do not combine it with Benadryl or other sedating medicines.  That medicine can cause you to be sedated as well as dizzy or unsteady so be careful taking that medicine.   I will check some other lab work today  but if you have any increased difficulty with eating, nausea or vomiting please follow-up sooner.  Otherwise recheck in 2 weeks.  Managing Depression, Adult Depression is a mental health condition that affects your  thoughts, feelings, and actions. Being diagnosed with depression can bring you relief if you did not know why you have felt or behaved a certain way. It could also leave you feeling overwhelmed with uncertainty about your future. Preparing yourself to manage your symptoms can help you feel more positive about your future. How to manage lifestyle changes Managing stress Stress is your body's reaction to life changes and events, both good and bad. Stress can add to your feelings of depression. Learning to manage your stress can help lessen your feelings of depression. Try some of the following approaches to reducing your stress (stress reduction techniques):  Listen to music that you enjoy and that inspires you.  Try using a meditation app or take a meditation class.  Develop a practice that helps you connect with your spiritual self. Walk in nature, pray, or go to a place of worship.  Do some deep breathing. To do this, inhale slowly through your nose. Pause at the top of your inhale for a few seconds and then exhale slowly, letting your muscles relax.  Practice yoga to help relax and work your muscles. Choose a stress reduction technique that suits your lifestyle and personality. These techniques take time and practice to develop. Set aside 5-15 minutes a day to do them. Therapists can offer training in these techniques. Other things you can do to manage stress include:  Keeping a stress diary.  Knowing your limits and saying no when you think something is too much.  Paying attention to how you react to certain situations. You may not be able to control everything, but you can change your reaction.  Adding humor to your life by watching funny films or TV shows.  Making time for activities that you enjoy and that relax you.   Medicines Medicines, such as antidepressants, are often a part of treatment for depression.  Talk with your pharmacist or health care provider about all the  medicines, supplements, and herbal products that you take, their possible side effects, and what medicines and other products are safe to take together.  Make sure to report any side effects you may have to your health care provider. Relationships Your health care provider may suggest family therapy, couples therapy, or individual therapy as part of your treatment. How to recognize changes Everyone responds differently to treatment for depression. As you recover from depression, you may start to:  Have more interest in doing activities.  Feel less hopeless.  Have more energy.  Overeat less often, or have a better appetite.  Have better mental focus. It is important to recognize if your depression is not getting better or is getting worse. The symptoms you had in the beginning may return, such as:  Tiredness (fatigue) or low energy.  Eating too much or too little.  Sleeping too much or too little.  Feeling restless, agitated, or hopeless.  Trouble focusing or making decisions.  Unexplained physical complaints.  Feeling irritable, angry, or aggressive. If you or your family members notice these symptoms coming back, let your health care provider know right away. Follow these instructions at home: Activity  Try to get some form of exercise each day, such as walking, biking, swimming, or lifting weights.  Practice stress reduction techniques.  Engage your mind by taking a  class or doing some volunteer work.   Lifestyle  Get the right amount and quality of sleep.  Cut down on using caffeine, tobacco, alcohol, and other potentially harmful substances.  Eat a healthy diet that includes plenty of vegetables, fruits, whole grains, low-fat dairy products, and lean protein. Do not eat a lot of foods that are high in solid fats, added sugars, or salt (sodium). General instructions  Take over-the-counter and prescription medicines only as told by your health care  provider.  Keep all follow-up visits as told by your health care provider. This is important. Where to find support Talking to others Friends and family members can be sources of support and guidance. Talk to trusted friends or family members about your condition. Explain your symptoms to them, and let them know that you are working with a health care provider to treat your depression. Tell friends and family members how they also can be helpful.   Finances  Find appropriate mental health providers that fit with your financial situation.  Talk with your health care provider about options to get reduced prices on your medicines. Where to find more information You can find support in your area from:  Anxiety and Depression Association of America (ADAA): www.adaa.org  Mental Health America: www.mentalhealthamerica.net  [employer] on Mental Illness: www.nami.org Contact a health care provider if:  You stop taking your antidepressant medicines, and you have any of these symptoms: ? Nausea. ? Headache. ? Light-headedness. ? Chills and body aches. ? Not being able to sleep (insomnia).  You or your friends and family think your depression is getting worse. Get help right away if:  You have thoughts of hurting yourself or others. If you ever feel like you may hurt yourself or others, or have thoughts about taking your own life, get help right away. Go to your nearest emergency department or:  Call your local emergency services (911 in the [place]  Call a suicide crisis helpline, such as the [place] at 515-668-7640. This is open 24 hours a day in the [place]  Text the Crisis Text Line at 367-413-1272 (in the [place].). Summary  If you are diagnosed with depression, preparing yourself to manage your symptoms is a good way to feel positive about your future.  Work with your health care provider on a management plan that includes stress reduction techniques,  medicines (if applicable), therapy, and healthy lifestyle habits.  Keep talking with your health care provider about how your treatment is working.  If you have thoughts about taking your own life, call a suicide crisis helpline or text a crisis text line. This information is not intended to replace advice given to you by your health care provider. Make sure you discuss any questions you have with your health care provider. Document Revised: 05/29/2019 Document Reviewed: 05/29/2019 Elsevier Patient Education  2021 [employer].   If you have lab work done today you will be contacted with your lab results within the next 2 weeks.  If you have not heard from [place] then please contact us. The fastest way to get your results is to register for My Chart.   IF you received an x-ray today, you will receive an invoice from [facility]. Please contact [facility] at [phone] with questions or concerns regarding your invoice.   IF you received labwork today, you will receive an invoice from [facility]. Please contact [facility] at [phone] with questions or concerns regarding your invoice.   Our billing staff  will not be able to assist you with questions regarding bills from these companies.  You will be contacted with the lab results as soon as they are available. The fastest way to get your results is to activate your My Chart account. Instructions are located on the last page of this paperwork. If you have not heard from [place] regarding the results in 2 weeks, please contact this office.         Signed, Merri Ray, MD [facility]

## 2020-08-25 LAB — COMPREHENSIVE METABOLIC PANEL
ALT: 43 IU/L — ABNORMAL HIGH (ref 0–32)
AST: 63 IU/L — ABNORMAL HIGH (ref 0–40)
Albumin/Globulin Ratio: 1.6 (ref 1.2–2.2)
Albumin: 4.3 g/dL (ref 3.7–4.7)
Alkaline Phosphatase: 102 IU/L (ref 44–121)
BUN/Creatinine Ratio: 15 (ref 12–28)
BUN: 15 mg/dL (ref 8–27)
Bilirubin Total: 1 mg/dL (ref 0.0–1.2)
CO2: 26 mmol/L (ref 20–29)
Calcium: 9.3 mg/dL (ref 8.7–10.3)
Chloride: 100 mmol/L (ref 96–106)
Creatinine, Ser: 1 mg/dL (ref 0.57–1.00)
GFR calc Af Amer: 62 mL/min/{1.73_m2} (ref >59)
GFR calc non Af Amer: 54 mL/min/{1.73_m2} — ABNORMAL LOW (ref >59)
Globulin, Total: 2.7 g/dL (ref 1.5–4.5)
Glucose: 102 mg/dL — ABNORMAL HIGH (ref 65–99)
Potassium: 3.8 mmol/L (ref 3.5–5.2)
Sodium: 141 mmol/L (ref 134–144)
Total Protein: 7 g/dL (ref 6.0–8.5)

## 2020-08-25 LAB — CBC
Hematocrit: 40.9 % (ref 34.0–46.6)
Hemoglobin: 13.8 g/dL (ref 11.1–15.9)
MCH: 28.5 pg (ref 26.6–33.0)
MCHC: 33.7 g/dL (ref 31.5–35.7)
MCV: 85 fL (ref 79–97)
Platelets: 262 10*3/uL (ref 150–450)
RBC: 4.84 x10E6/uL (ref 3.77–5.28)
RDW: 14.1 % (ref 11.7–15.4)
WBC: 7.7 10*3/uL (ref 3.4–10.8)

## 2020-08-25 LAB — TSH: TSH: 0.969 u[IU]/mL (ref 0.450–4.500)

## 2020-08-28 ENCOUNTER — Telehealth: Payer: Self-pay | Admitting: Family Medicine

## 2020-08-28 DIAGNOSIS — F418 Other specified anxiety disorders: Secondary | ICD-10-CM

## 2020-08-28 DIAGNOSIS — F329 Major depressive disorder, single episode, unspecified: Secondary | ICD-10-CM

## 2020-08-28 NOTE — Telephone Encounter (Signed)
Pt called stating that she is currently taking Hydroxyzine. She states that they are causing her to feel shaky and nervous. Pt is requesting to have a few Valium sent in for her to help calm her nerves. PT is requesting to have these sent in today since there may be snow this weekend. Please advise.       [id number] [place], [place] - [address]  [address] [place] [address]  Phone: [phone] Fax: [phone]  Hours: Not open 24 hours

## 2020-08-31 NOTE — Telephone Encounter (Signed)
Pt states that the Hydroxyzine is causing her to feel shaky and nervous. Pt is requesting to have a few Valium sent in for her to help calm her nerves. I assume the pt should take a different Antihistamine. Please advise.

## 2020-09-01 MED ORDER — DIAZEPAM 2 MG PO TABS: 2.0000 mg | ORAL_TABLET | Freq: Two times a day (BID) | ORAL | 0 refills | Status: AC | PRN

## 2020-09-01 NOTE — Telephone Encounter (Signed)
Some of her symptoms may be due to to initial side effects of sertraline. Those should improve. Okay to stop hydroxyzine at this time, I will prescribe a few Valium short-term, but not long-term solution, and watch for any lightheadedness, dizziness with that medication.  Follow-up in the next 5-7 days if not improving, sooner if worse.

## 2020-09-01 NOTE — Telephone Encounter (Signed)
Called to discuss medication changes advised by J.Greene. LM for pt to call back

## 2020-09-02 NOTE — Telephone Encounter (Signed)
Pt aware of medication changes. No more information needed

## 2020-09-02 NOTE — Progress Notes (Signed)
Letter SENT 

## 2020-09-02 NOTE — Telephone Encounter (Signed)
Pt called back this morning. Read off what provider stated to pt she showed understanding. Please advise.

## 2020-09-04 ENCOUNTER — Ambulatory Visit: Payer: PPO | Admitting: Family Medicine

## 2020-09-10 ENCOUNTER — Ambulatory Visit: Payer: PPO | Admitting: Family Medicine

## 2020-09-10 ENCOUNTER — Other Ambulatory Visit: Payer: Self-pay

## 2020-09-10 ENCOUNTER — Ambulatory Visit (INDEPENDENT_AMBULATORY_CARE_PROVIDER_SITE_OTHER): Payer: PPO | Admitting: Family Medicine

## 2020-09-10 ENCOUNTER — Encounter: Payer: Self-pay | Admitting: Family Medicine

## 2020-09-10 VITALS — BP 166/92 | HR 82 | Temp 97.6°F | Resp 17 | Ht 61.0 in | Wt 215.8 lb

## 2020-09-10 DIAGNOSIS — R059 Cough, unspecified: Secondary | ICD-10-CM | POA: Diagnosis not present

## 2020-09-10 DIAGNOSIS — R7989 Other specified abnormal findings of blood chemistry: Secondary | ICD-10-CM

## 2020-09-10 DIAGNOSIS — R030 Elevated blood-pressure reading, without diagnosis of hypertension: Secondary | ICD-10-CM

## 2020-09-10 DIAGNOSIS — F329 Major depressive disorder, single episode, unspecified: Secondary | ICD-10-CM

## 2020-09-10 DIAGNOSIS — R739 Hyperglycemia, unspecified: Secondary | ICD-10-CM

## 2020-09-10 MED ORDER — SERTRALINE HCL 25 MG PO TABS: 25.0000 mg | ORAL_TABLET | Freq: Every day | ORAL | 1 refills | Status: AC

## 2020-09-10 NOTE — Progress Notes (Signed)
Subjective:  Patient ID: Barbara Soto, female    DOB: 30-Apr-1941  Age: 80 y.o. MRN: [id number]  CC:  Chief Complaint  Patient presents with  . Depression    Pt reports medications did not work well and she flushed them. Pt has the Valium still but did get rid of zoloft. PHQ9 score 10   . Cough    Pt following up from RSV pt doing better cough is gone. Also was requested to have her liver test and blood sugar rechecked per labs     HPI Barbara Soto presents for   Depression: Discussed last visit with reports of feeling depressed after she became sick.  Had denied recent depression but has used Valium in the past during separation in the 1980s.  She quit her job a few weeks ago.  Decreased appetite.  Anhedonia.  Some increased difficulty sleeping, using Benadryl.  Felt like albuterol made her depressed but no change with stopping albuterol.  Sister passed away in 02/21/2023 of last year, brother passed away in 06/24/2023 and older sister with dementia is not doing well.  Thought to have adjustment disorder with recent illness as well as with deaths in the family last year.  Counseling was discussed but declined.  Started low-dose sertraline 25 mg daily.  Hydroxyzine provided as needed for sleep.  Tried hydroxyzine for 4-5 days - felt jittery so stopped - flushed down toilet.   Taking sertraline daily at 6pm - feels like this has helped depression. No new side effects Still feels down/depressed. Prayer is helping. Appetite is getting better.  Valium - 1/4 pill at times. Not daily.  Sister is expected to pass in next week.     Depression screen [facility] 2/9 09/10/2020 08/24/2020 08/07/2020 02/12/2020 01/08/2020  Decreased Interest 0 0 0 0 0  Down, Depressed, Hopeless 2 1 0 0 0  PHQ - 2 Score 2 1 0 0 0  Altered sleeping 3 3 - - -  Tired, decreased energy 2 3 - - -  Change in appetite 3 3 - - -  Feeling bad or failure about yourself  0 0 - - -  Trouble concentrating 0 0 - - -  Moving slowly or  fidgety/restless 0 0 - - -  Suicidal thoughts 0 0 - - -  PHQ-9 Score 10 10 - - -   History of RSV infection Improved, cough resolved.  Hypertension: Has reported elevated readings in doctor's office the past.  Whitecoat hypertension discussed in August 2021.  Had reported blood pressures at the local fire station in the 110s over 70s. Plans to have BP checked at fire station after leaving here.  BP Readings from Last 3 Encounters:  09/10/20 (!) 166/92  08/24/20 (!) 146/90  03/24/20 (!) 155/85   Lab Results  Component Value Date   CREATININE 1.00 08/24/2020   Elevated LFTs Slight increase in LFTs on January 24 compared to 03/24/2020.  Glucose 102 at that time  History Patient Active Problem List   Diagnosis Date Noted  . Status post right hip replacement 10/17/2017  . Nausea and vomiting 09/05/2017  . Anemia 09/05/2017  . Acute blood loss as cause of postoperative anemia 09/01/2017  . Primary osteoarthritis of right hip 08/29/2017  . Essential hypertension 07/22/2009  . GALLBLADDER DISEASE 07/22/2009  . ARTHRITIS 07/22/2009   Past Medical History:  Diagnosis Date  . Complication of anesthesia   . DJD (degenerative joint disease)    right hip  . Hypertension   .  PONV (postoperative nausea and vomiting)   . Squamous cell carcinoma of skin 06/08/2020   mod diff/ulcerated-Left parotid area (MOHS)  . Wears dentures   . Wears glasses    Past Surgical History:  Procedure Laterality Date  . ABDOMINAL HYSTERECTOMY    . APPENDECTOMY    . CATARACT EXTRACTION W/ INTRAOCULAR LENS  IMPLANT, BILATERAL    . CHOLECYSTECTOMY    . DILATION AND CURETTAGE OF UTERUS    . MULTIPLE TOOTH EXTRACTIONS    . TOTAL HIP ARTHROPLASTY Right 08/29/2017   Procedure: TOTAL HIP ARTHROPLASTY ANTERIOR APPROACH;  Surgeon: Melrose Nakayama, MD;  Location: [place];  Service: Orthopedics;  Laterality: Right;   Allergies  Allergen Reactions  . Adhesive [Tape] Other (See Comments)    Takes skin off   .  Penicillins Rash and Other (See Comments)    PATIENT HAS HAD A PCN REACTION WITH IMMEDIATE RASH, FACIAL/TONGUE/THROAT SWELLING, SOB, OR LIGHTHEADEDNESS WITH HYPOTENSION:  #  #  #  YES  #  #  #   Has patient had a PCN reaction causing severe rash involving mucus membranes or skin necrosis: No Has patient had a PCN reaction that required hospitalization: No Has patient had a PCN reaction occurring within the last 10 years: No   . Latex Other (See Comments)    unlisted   Prior to Admission medications   Medication Sig Start Date End Date Taking? Authorizing Provider  aspirin EC 81 MG tablet Take 81 mg by mouth daily. Swallow whole.    [provider]  diazepam (VALIUM) 2 MG tablet Take 1 tablet (2 mg total) by mouth every 12 (twelve) hours as needed for anxiety. 09/01/20   Wendie Agreste, MD   Social History   Socioeconomic History  . Marital status: Widowed    Spouse name: Not on file  . Number of children: Not on file  . Years of education: Not on file  . Highest education level: Not on file  Occupational History  . Not on file  Tobacco Use  . Smoking status: Never Smoker  . Smokeless tobacco: Never Used  Vaping Use  . Vaping Use: Not on file  Substance and Sexual Activity  . Alcohol use: No  . Drug use: No  . Sexual activity: Yes    Birth control/protection: Surgical  Other Topics Concern  . Not on file  Social History Narrative  . Not on file   Social Determinants of Health   Financial Resource Strain: Not on file  Food Insecurity: Not on file  Transportation Needs: Not on file  Physical Activity: Not on file  Stress: Not on file  Social Connections: Not on file  Intimate Partner Violence: Not on file    Review of Systems  Respiratory: Negative for chest tightness and shortness of breath.   Cardiovascular: Negative for chest pain, palpitations and leg swelling.  Gastrointestinal: Negative for abdominal pain, blood in stool, nausea and vomiting.     Objective:   Vitals:   09/10/20 0841 09/10/20 0908  BP: (!) 215/107 (!) 166/92  Pulse: 82   Resp: 17   Temp: 97.6 F (36.4 C)   TempSrc: Temporal   SpO2: 98%   Weight: 215 lb 12.8 oz (97.9 kg)   Height: 5\' 1"  (1.549 m)      Physical Exam Vitals reviewed.  Constitutional:      Appearance: She is well-developed and well-nourished.     Comments: Overweight.   HENT:     Head: Normocephalic  and atraumatic.  Eyes:     Extraocular Movements: EOM normal.     Conjunctiva/sclera: Conjunctivae normal.     Pupils: Pupils are equal, round, and reactive to light.  Neck:     Vascular: No carotid bruit.  Cardiovascular:     Rate and Rhythm: Normal rate and regular rhythm.     Pulses: Intact distal pulses.     Heart sounds: Normal heart sounds.  Pulmonary:     Effort: Pulmonary effort is normal.     Breath sounds: Normal breath sounds.  Abdominal:     Palpations: Abdomen is soft. There is no pulsatile mass.     Tenderness: There is no abdominal tenderness.  Skin:    General: Skin is warm and dry.  Neurological:     Mental Status: She is alert and oriented to person, place, and time.  Psychiatric:        Mood and Affect: Mood and affect and mood normal.        Behavior: Behavior normal.        Thought Content: Thought content normal.    30 minutes spent during visit, greater than 50% counseling and assimilation of information, chart review, and discussion of plan.    Assessment & Plan:  DASIAH HOOLEY is a 80 y.o. female . Reactive depression - Plan: sertraline (ZOLOFT) 25 MG tablet  -Reports some improvement in symptoms, PHQ similar.  Declines counseling but numbers provided if she changes her mind.  Would like to continue same dose sertraline.  No changes for now.  Has diazepam, using low dose, but if persistent use will need to look at other changes.  Cough  -Status post RSV infection, improved.  Denies current cough.  RTC precautions.  Elevated LFTs - Plan:  Comprehensive metabolic panel  -Slight increase last visit compared to last year.  Repeat testing.  Asymptomatic.  Hyperglycemia - Plan: Hemoglobin A1c  -Mild elevated reading last visit, may not have been fasting.  Check A1c  Elevated blood pressure reading  -Reports history of whitecoat hypertension.  Plans to check her blood pressure after leaving our office, RTC precautions given.  Asymptomatic.  Meds ordered this encounter  Medications  . sertraline (ZOLOFT) 25 MG tablet    Sig: Take 1 tablet (25 mg total) by mouth daily.    Dispense:  90 tablet    Refill:  1   Patient Instructions     Keep a record of your blood pressures outside of the office and if over 140/90 - return to discuss further.   Continue sertraline 1 pill/day.  Diazepam short-term for anxiety, but if that medicine is needed consistently we will need to look at other medication changes.  I would consider meeting with therapist as we discussed, and some numbers have been provided below.    I will recheck some lab work today.  Recheck in 3 months as long as you are continuing to improve, sooner if worse.  Here are a few options for counseling:  [facility]:  [facility] [phone]    http://APA.org/depression-guideline"> https://clinicalkey.com"> http://point-of-care.elsevierperformancemanager.com/skills/"> http://point-of-care.elsevierperformancemanager.com">  Managing Depression, Adult Depression is a mental health condition that affects your thoughts, feelings, and actions. Being diagnosed with depression can bring you relief if you did not know why you have felt or behaved a certain way. It could also leave you feeling overwhelmed with uncertainty about your future. Preparing yourself to manage your symptoms can help you feel more positive about your future. How to  manage lifestyle changes Managing stress Stress is your body's reaction to life  changes and events, both good and bad. Stress can add to your feelings of depression. Learning to manage your stress can help lessen your feelings of depression. Try some of the following approaches to reducing your stress (stress reduction techniques):  Listen to music that you enjoy and that inspires you.  Try using a meditation app or take a meditation class.  Develop a practice that helps you connect with your spiritual self. Walk in nature, pray, or go to a place of worship.  Do some deep breathing. To do this, inhale slowly through your nose. Pause at the top of your inhale for a few seconds and then exhale slowly, letting your muscles relax.  Practice yoga to help relax and work your muscles. Choose a stress reduction technique that suits your lifestyle and personality. These techniques take time and practice to develop. Set aside 5-15 minutes a day to do them. Therapists can offer training in these techniques. Other things you can do to manage stress include:  Keeping a stress diary.  Knowing your limits and saying no when you think something is too much.  Paying attention to how you react to certain situations. You may not be able to control everything, but you can change your reaction.  Adding humor to your life by watching funny films or TV shows.  Making time for activities that you enjoy and that relax you.   Medicines Medicines, such as antidepressants, are often a part of treatment for depression.  Talk with your pharmacist or health care provider about all the medicines, supplements, and herbal products that you take, their possible side effects, and what medicines and other products are safe to take together.  Make sure to report any side effects you may have to your health care provider. Relationships Your health care provider may suggest family therapy, couples therapy, or individual therapy as part of your treatment. How to recognize changes Everyone responds  differently to treatment for depression. As you recover from depression, you may start to:  Have more interest in doing activities.  Feel less hopeless.  Have more energy.  Overeat less often, or have a better appetite.  Have better mental focus. It is important to recognize if your depression is not getting better or is getting worse. The symptoms you had in the beginning may return, such as:  Tiredness (fatigue) or low energy.  Eating too much or too little.  Sleeping too much or too little.  Feeling restless, agitated, or hopeless.  Trouble focusing or making decisions.  Unexplained physical complaints.  Feeling irritable, angry, or aggressive. If you or your family members notice these symptoms coming back, let your health care provider know right away. Follow these instructions at home: Activity  Try to get some form of exercise each day, such as walking, biking, swimming, or lifting weights.  Practice stress reduction techniques.  Engage your mind by taking a class or doing some volunteer work.   Lifestyle  Get the right amount and quality of sleep.  Cut down on using caffeine, tobacco, alcohol, and other potentially harmful substances.  Eat a healthy diet that includes plenty of vegetables, fruits, whole grains, low-fat dairy products, and lean protein. Do not eat a lot of foods that are high in solid fats, added sugars, or salt (sodium). General instructions  Take over-the-counter and prescription medicines only as told by your health care provider.  Keep all follow-up visits  as told by your health care provider. This is important. Where to find support Talking to others Friends and family members can be sources of support and guidance. Talk to trusted friends or family members about your condition. Explain your symptoms to them, and let them know that you are working with a health care provider to treat your depression. Tell friends and family members how  they also can be helpful.   Finances  Find appropriate mental health providers that fit with your financial situation.  Talk with your health care provider about options to get reduced prices on your medicines. Where to find more information You can find support in your area from:  Anxiety and Depression Association of America (ADAA): www.adaa.org  Mental Health America: www.mentalhealthamerica.net  [employer] on Mental Illness: www.nami.org Contact a health care provider if:  You stop taking your antidepressant medicines, and you have any of these symptoms: ? Nausea. ? Headache. ? Light-headedness. ? Chills and body aches. ? Not being able to sleep (insomnia).  You or your friends and family think your depression is getting worse. Get help right away if:  You have thoughts of hurting yourself or others. If you ever feel like you may hurt yourself or others, or have thoughts about taking your own life, get help right away. Go to your nearest emergency department or:  Call your local emergency services (911 in the [place]  Call a suicide crisis helpline, such as the [place] at 934-266-4709. This is open 24 hours a day in the [place]  Text the Crisis Text Line at 445-498-9776 (in the [place].). Summary  If you are diagnosed with depression, preparing yourself to manage your symptoms is a good way to feel positive about your future.  Work with your health care provider on a management plan that includes stress reduction techniques, medicines (if applicable), therapy, and healthy lifestyle habits.  Keep talking with your health care provider about how your treatment is working.  If you have thoughts about taking your own life, call a suicide crisis helpline or text a crisis text line. This information is not intended to replace advice given to you by your health care provider. Make sure you discuss any questions you have with your health care  provider. Document Revised: 05/29/2019 Document Reviewed: 05/29/2019 Elsevier Patient Education  2021 [employer].   If you have lab work done today you will be contacted with your lab results within the next 2 weeks.  If you have not heard from [place] then please contact us. The fastest way to get your results is to register for My Chart.   IF you received an x-ray today, you will receive an invoice from [facility]. Please contact [facility] at [phone] with questions or concerns regarding your invoice.   IF you received labwork today, you will receive an invoice from [facility]. Please contact [facility] at [phone] with questions or concerns regarding your invoice.   Our billing staff will not be able to assist you with questions regarding bills from these companies.  You will be contacted with the lab results as soon as they are available. The fastest way to get your results is to activate your My Chart account. Instructions are located on the last page of this paperwork. If you have not heard from [place] regarding the results in 2 weeks, please contact this office.         Signed, Merri Ray, MD [facility]

## 2020-09-10 NOTE — Patient Instructions (Addendum)
Keep a record of your blood pressures outside of the office and if over 140/90 - return to discuss further.   Continue sertraline 1 pill/day.  Diazepam short-term for anxiety, but if that medicine is needed consistently we will need to look at other medication changes.  I would consider meeting with therapist as we discussed, and some numbers have been provided below.    I will recheck some lab work today.  Recheck in 3 months as long as you are continuing to improve, sooner if worse.  Here are a few options for counseling:  [facility]:  [place] [phone]    http://APA.org/depression-guideline"> https://clinicalkey.com"> http://point-of-care.elsevierperformancemanager.com/skills/"> http://point-of-care.elsevierperformancemanager.com">  Managing Depression, Adult Depression is a mental health condition that affects your thoughts, feelings, and actions. Being diagnosed with depression can bring you relief if you did not know why you have felt or behaved a certain way. It could also leave you feeling overwhelmed with uncertainty about your future. Preparing yourself to manage your symptoms can help you feel more positive about your future. How to manage lifestyle changes Managing stress Stress is your body's reaction to life changes and events, both good and bad. Stress can add to your feelings of depression. Learning to manage your stress can help lessen your feelings of depression. Try some of the following approaches to reducing your stress (stress reduction techniques):  Listen to music that you enjoy and that inspires you.  Try using a meditation app or take a meditation class.  Develop a practice that helps you connect with your spiritual self. Walk in nature, pray, or go to a place of worship.  Do some deep breathing. To do this, inhale slowly through your nose. Pause at the top of your inhale for a few seconds and  then exhale slowly, letting your muscles relax.  Practice yoga to help relax and work your muscles. Choose a stress reduction technique that suits your lifestyle and personality. These techniques take time and practice to develop. Set aside 5-15 minutes a day to do them. Therapists can offer training in these techniques. Other things you can do to manage stress include:  Keeping a stress diary.  Knowing your limits and saying no when you think something is too much.  Paying attention to how you react to certain situations. You may not be able to control everything, but you can change your reaction.  Adding humor to your life by watching funny films or TV shows.  Making time for activities that you enjoy and that relax you.   Medicines Medicines, such as antidepressants, are often a part of treatment for depression.  Talk with your pharmacist or health care provider about all the medicines, supplements, and herbal products that you take, their possible side effects, and what medicines and other products are safe to take together.  Make sure to report any side effects you may have to your health care provider. Relationships Your health care provider may suggest family therapy, couples therapy, or individual therapy as part of your treatment. How to recognize changes Everyone responds differently to treatment for depression. As you recover from depression, you may start to:  Have more interest in doing activities.  Feel less hopeless.  Have more energy.  Overeat less often, or have a better appetite.  Have better mental focus. It is important to recognize if your depression is not getting better or is getting worse. The symptoms you had in the beginning may return, such as:  Tiredness (  fatigue) or low energy.  Eating too much or too little.  Sleeping too much or too little.  Feeling restless, agitated, or hopeless.  Trouble focusing or making decisions.  Unexplained  physical complaints.  Feeling irritable, angry, or aggressive. If you or your family members notice these symptoms coming back, let your health care provider know right away. Follow these instructions at home: Activity  Try to get some form of exercise each day, such as walking, biking, swimming, or lifting weights.  Practice stress reduction techniques.  Engage your mind by taking a class or doing some volunteer work.   Lifestyle  Get the right amount and quality of sleep.  Cut down on using caffeine, tobacco, alcohol, and other potentially harmful substances.  Eat a healthy diet that includes plenty of vegetables, fruits, whole grains, low-fat dairy products, and lean protein. Do not eat a lot of foods that are high in solid fats, added sugars, or salt (sodium). General instructions  Take over-the-counter and prescription medicines only as told by your health care provider.  Keep all follow-up visits as told by your health care provider. This is important. Where to find support Talking to others Friends and family members can be sources of support and guidance. Talk to trusted friends or family members about your condition. Explain your symptoms to them, and let them know that you are working with a health care provider to treat your depression. Tell friends and family members how they also can be helpful.   Finances  Find appropriate mental health providers that fit with your financial situation.  Talk with your health care provider about options to get reduced prices on your medicines. Where to find more information You can find support in your area from:  Anxiety and Depression Association of America (ADAA): www.adaa.org  Mental Health America: www.mentalhealthamerica.net  [employer] on Mental Illness: www.nami.org Contact a health care provider if:  You stop taking your antidepressant medicines, and you have any of these  symptoms: ? Nausea. ? Headache. ? Light-headedness. ? Chills and body aches. ? Not being able to sleep (insomnia).  You or your friends and family think your depression is getting worse. Get help right away if:  You have thoughts of hurting yourself or others. If you ever feel like you may hurt yourself or others, or have thoughts about taking your own life, get help right away. Go to your nearest emergency department or:  Call your local emergency services (911 in the [place]  Call a suicide crisis helpline, such as the [facility] at 437-668-6562. This is open 24 hours a day in the [place]  Text the Crisis Text Line at 810-249-4309 (in the [place].). Summary  If you are diagnosed with depression, preparing yourself to manage your symptoms is a good way to feel positive about your future.  Work with your health care provider on a management plan that includes stress reduction techniques, medicines (if applicable), therapy, and healthy lifestyle habits.  Keep talking with your health care provider about how your treatment is working.  If you have thoughts about taking your own life, call a suicide crisis helpline or text a crisis text line. This information is not intended to replace advice given to you by your health care provider. Make sure you discuss any questions you have with your health care provider. Document Revised: 05/29/2019 Document Reviewed: 05/29/2019 Elsevier Patient Education  2021 [employer].   If you have lab work done today you will be  contacted with your lab results within the next 2 weeks.  If you have not heard from [place] then please contact us. The fastest way to get your results is to register for My Chart.   IF you received an x-ray today, you will receive an invoice from [facility]. Please contact [facility] at [phone] with questions or concerns regarding your invoice.   IF you received labwork today, you will  receive an invoice from [facility]. Please contact [facility] at [phone] with questions or concerns regarding your invoice.   Our billing staff will not be able to assist you with questions regarding bills from these companies.  You will be contacted with the lab results as soon as they are available. The fastest way to get your results is to activate your My Chart account. Instructions are located on the last page of this paperwork. If you have not heard from [place] regarding the results in 2 weeks, please contact this office.

## 2020-09-11 LAB — COMPREHENSIVE METABOLIC PANEL
ALT: 14 IU/L (ref 0–32)
AST: 29 IU/L (ref 0–40)
Albumin/Globulin Ratio: 1.5 (ref 1.2–2.2)
Albumin: 4 g/dL (ref 3.7–4.7)
Alkaline Phosphatase: 116 IU/L (ref 44–121)
BUN/Creatinine Ratio: 11 — ABNORMAL LOW (ref 12–28)
BUN: 10 mg/dL (ref 8–27)
Bilirubin Total: 0.6 mg/dL (ref 0.0–1.2)
CO2: 24 mmol/L (ref 20–29)
Calcium: 8.9 mg/dL (ref 8.7–10.3)
Chloride: 99 mmol/L (ref 96–106)
Creatinine, Ser: 0.87 mg/dL (ref 0.57–1.00)
GFR calc Af Amer: 74 mL/min/{1.73_m2} (ref >59)
GFR calc non Af Amer: 64 mL/min/{1.73_m2} (ref >59)
Globulin, Total: 2.7 g/dL (ref 1.5–4.5)
Glucose: 106 mg/dL — ABNORMAL HIGH (ref 65–99)
Potassium: 3.7 mmol/L (ref 3.5–5.2)
Sodium: 141 mmol/L (ref 134–144)
Total Protein: 6.7 g/dL (ref 6.0–8.5)

## 2020-09-11 LAB — HEMOGLOBIN A1C
Est. average glucose Bld gHb Est-mCnc: 117 mg/dL
Hgb A1c MFr Bld: 5.7 % — ABNORMAL HIGH (ref 4.8–5.6)

## 2020-09-22 NOTE — Progress Notes (Signed)
Lab Letter SENT  

## 2020-09-23 DIAGNOSIS — H18519 Endothelial corneal dystrophy, unspecified eye: Secondary | ICD-10-CM | POA: Diagnosis not present

## 2020-09-23 DIAGNOSIS — Z85828 Personal history of other malignant neoplasm of skin: Secondary | ICD-10-CM | POA: Diagnosis not present

## 2020-09-23 DIAGNOSIS — Z96641 Presence of right artificial hip joint: Secondary | ICD-10-CM | POA: Diagnosis not present

## 2020-09-23 DIAGNOSIS — M1611 Unilateral primary osteoarthritis, right hip: Secondary | ICD-10-CM | POA: Diagnosis not present

## 2020-09-23 DIAGNOSIS — G4700 Insomnia, unspecified: Secondary | ICD-10-CM | POA: Diagnosis not present

## 2020-09-23 DIAGNOSIS — Z1339 Encounter for screening examination for other mental health and behavioral disorders: Secondary | ICD-10-CM | POA: Diagnosis not present

## 2020-09-23 DIAGNOSIS — D62 Acute posthemorrhagic anemia: Secondary | ICD-10-CM | POA: Diagnosis not present

## 2020-09-23 DIAGNOSIS — Z1331 Encounter for screening for depression: Secondary | ICD-10-CM | POA: Diagnosis not present

## 2020-09-23 DIAGNOSIS — I10 Essential (primary) hypertension: Secondary | ICD-10-CM | POA: Diagnosis not present

## 2020-09-23 DIAGNOSIS — D649 Anemia, unspecified: Secondary | ICD-10-CM | POA: Diagnosis not present

## 2020-09-23 DIAGNOSIS — R7303 Prediabetes: Secondary | ICD-10-CM | POA: Diagnosis not present

## 2020-10-07 ENCOUNTER — Other Ambulatory Visit: Payer: Self-pay | Admitting: Registered Nurse

## 2020-10-07 DIAGNOSIS — F5104 Psychophysiologic insomnia: Secondary | ICD-10-CM

## 2020-11-06 ENCOUNTER — Encounter: Payer: Self-pay | Admitting: Family Medicine

## 2020-11-10 ENCOUNTER — Encounter: Payer: Self-pay | Admitting: Dermatology

## 2020-11-10 ENCOUNTER — Ambulatory Visit: Payer: PPO | Admitting: Dermatology

## 2020-11-10 ENCOUNTER — Other Ambulatory Visit: Payer: Self-pay

## 2020-11-10 DIAGNOSIS — D485 Neoplasm of uncertain behavior of skin: Secondary | ICD-10-CM | POA: Diagnosis not present

## 2020-11-10 DIAGNOSIS — L821 Other seborrheic keratosis: Secondary | ICD-10-CM

## 2020-11-10 DIAGNOSIS — Z85828 Personal history of other malignant neoplasm of skin: Secondary | ICD-10-CM

## 2020-11-10 DIAGNOSIS — L578 Other skin changes due to chronic exposure to nonionizing radiation: Secondary | ICD-10-CM | POA: Diagnosis not present

## 2020-11-10 DIAGNOSIS — L570 Actinic keratosis: Secondary | ICD-10-CM

## 2020-11-10 DIAGNOSIS — I781 Nevus, non-neoplastic: Secondary | ICD-10-CM | POA: Diagnosis not present

## 2020-11-10 DIAGNOSIS — L905 Scar conditions and fibrosis of skin: Secondary | ICD-10-CM | POA: Diagnosis not present

## 2020-11-10 NOTE — Patient Instructions (Signed)

## 2020-11-17 ENCOUNTER — Telehealth: Payer: Self-pay | Admitting: Dermatology

## 2020-11-17 NOTE — Telephone Encounter (Signed)
Patient is calling for pathology results from last visit with Stuart Tafeen, MD 

## 2020-11-17 NOTE — Telephone Encounter (Signed)
Left message for patient saying path report not back yet and to call in a few days.

## 2020-11-18 DIAGNOSIS — H18519 Endothelial corneal dystrophy, unspecified eye: Secondary | ICD-10-CM | POA: Diagnosis not present

## 2020-11-18 DIAGNOSIS — M1611 Unilateral primary osteoarthritis, right hip: Secondary | ICD-10-CM | POA: Diagnosis not present

## 2020-11-18 DIAGNOSIS — D649 Anemia, unspecified: Secondary | ICD-10-CM | POA: Diagnosis not present

## 2020-11-18 DIAGNOSIS — D62 Acute posthemorrhagic anemia: Secondary | ICD-10-CM | POA: Diagnosis not present

## 2020-11-18 DIAGNOSIS — Z85828 Personal history of other malignant neoplasm of skin: Secondary | ICD-10-CM | POA: Diagnosis not present

## 2020-11-18 DIAGNOSIS — Z96641 Presence of right artificial hip joint: Secondary | ICD-10-CM | POA: Diagnosis not present

## 2020-11-18 DIAGNOSIS — R112 Nausea with vomiting, unspecified: Secondary | ICD-10-CM | POA: Diagnosis not present

## 2020-11-18 DIAGNOSIS — H3530 Unspecified macular degeneration: Secondary | ICD-10-CM | POA: Diagnosis not present

## 2020-11-18 DIAGNOSIS — R7303 Prediabetes: Secondary | ICD-10-CM | POA: Diagnosis not present

## 2020-11-18 DIAGNOSIS — G4700 Insomnia, unspecified: Secondary | ICD-10-CM | POA: Diagnosis not present

## 2020-11-18 DIAGNOSIS — I10 Essential (primary) hypertension: Secondary | ICD-10-CM | POA: Diagnosis not present

## 2020-11-18 DIAGNOSIS — R7989 Other specified abnormal findings of blood chemistry: Secondary | ICD-10-CM | POA: Diagnosis not present

## 2020-11-21 ENCOUNTER — Encounter: Payer: Self-pay | Admitting: Dermatology

## 2020-11-21 NOTE — Progress Notes (Signed)
   Follow-Up Visit   Subjective  Barbara Soto is a 80 y.o. female who presents for the following: Follow-up (Patient here today for follow up on SCC on left anterior neck per patient the area is still sore. Per patient she has a lesion between her breast that she would like rechecked and check lesion on right eyebrow x 2-3 months no bleeding, just sore.).  New bump near scar on left neck, new spot on chest and right forehead Location:  Duration:  Quality:  Associated Signs/Symptoms: Modifying Factors:  Severity:  Timing: Context:   Objective  Well appearing patient in no apparent distress; mood and affect are within normal limits. Objective  Left Anterior Neck superior: Pearly nodules both above and below the surgical site, all biopsy to rule out persistent carcinoma.     Left Anterior Neck inferior     Objective  Right Temple: Gritty 5 mm pink crust  Objective  Chest - Medial [facility]): Textured tan 5 mm papule    A focused examination was performed including Head, neck, chest, back, arms.. Relevant physical exam findings are noted in the Assessment and Plan.   Assessment & Plan    Neoplasm of uncertain behavior of skin (2) Left Anterior Neck superior  Skin / nail biopsy Type of biopsy: tangential   Informed consent: discussed and consent obtained   Timeout: patient name, date of birth, surgical site, and procedure verified   Procedure prep:  Patient was prepped and draped in usual sterile fashion (Non sterile) Prep type:  Chlorhexidine Anesthesia: the lesion was anesthetized in a standard fashion   Anesthetic:  1% lidocaine w/ epinephrine 1-100,000 local infiltration Instrument used: flexible razor blade   Outcome: patient tolerated procedure well   Post-procedure details: wound care instructions given    Specimen 1 - Surgical pathology Differential Diagnosis: bcc scc  Check Margins: No  Left Anterior Neck inferior  Skin / nail biopsy Type of  biopsy: tangential   Informed consent: discussed and consent obtained   Timeout: patient name, date of birth, surgical site, and procedure verified   Procedure prep:  Patient was prepped and draped in usual sterile fashion (Non sterile) Prep type:  Chlorhexidine Anesthesia: the lesion was anesthetized in a standard fashion   Anesthetic:  1% lidocaine w/ epinephrine 1-100,000 local infiltration Instrument used: flexible razor blade   Outcome: patient tolerated procedure well   Post-procedure details: wound care instructions given    Specimen 2 - Surgical pathology Differential Diagnosis: bcc scc  Check Margins: No  AK (actinic keratosis) Right Temple  Destruction of lesion - Right Temple Complexity: simple   Destruction method: cryotherapy   Informed consent: discussed and consent obtained   Timeout:  patient name, date of birth, surgical site, and procedure verified Lesion destroyed using liquid nitrogen: Yes   Cryotherapy cycles:  4 Outcome: patient tolerated procedure well with no complications   Post-procedure details: wound care instructions given    Seborrheic keratosis Chest - Medial ([facility])  Leave if stable      I, Lavonna Monarch, MD, have reviewed all documentation for this visit.  The documentation on 11/21/20 for the exam, diagnosis, procedures, and orders are all accurate and complete.

## 2020-11-23 ENCOUNTER — Telehealth: Payer: Self-pay | Admitting: Dermatology

## 2020-11-23 NOTE — Telephone Encounter (Signed)
Results ST. Says she's called 3x. Line 2

## 2020-11-23 NOTE — Telephone Encounter (Signed)
Phone call from patient wanting her pathology results. Pathology results given to patient.  

## 2020-12-30 ENCOUNTER — Ambulatory Visit: Payer: PPO | Admitting: Podiatry

## 2020-12-30 ENCOUNTER — Ambulatory Visit (INDEPENDENT_AMBULATORY_CARE_PROVIDER_SITE_OTHER): Payer: PPO

## 2020-12-30 ENCOUNTER — Other Ambulatory Visit: Payer: Self-pay

## 2020-12-30 DIAGNOSIS — G5763 Lesion of plantar nerve, bilateral lower limbs: Secondary | ICD-10-CM

## 2020-12-30 MED ORDER — GABAPENTIN 100 MG PO CAPS: 100.0000 mg | ORAL_CAPSULE | Freq: Three times a day (TID) | ORAL | 3 refills | Status: AC

## 2020-12-30 NOTE — Progress Notes (Signed)
   HPI: 80 y.o. female presenting today as a new patient for evaluation of numbness to the bilateral forefoot.  Patient states that her toes feel very numb throughout the day.  She does admit to walking barefoot the majority of the day.  She has not done anything for treatment.  Currently she only takes a baby aspirin daily for medications.  Past Medical History:  Diagnosis Date  . Complication of anesthesia   . DJD (degenerative joint disease)    right hip  . Hypertension   . PONV (postoperative nausea and vomiting)   . Squamous cell carcinoma of skin 06/08/2020   mod diff/ulcerated-Left parotid area ([facility])  . Wears dentures   . Wears glasses      Physical Exam: General: The patient is alert and oriented x3 in no acute distress.  Dermatology: Skin is warm, dry and supple bilateral lower extremities. Negative for open lesions or macerations.  Vascular: Palpable pedal pulses bilaterally. No edema or erythema noted. Capillary refill within normal limits.  Neurological: Epicritic and protective threshold grossly intact bilaterally.  Paresthesia with pins and needle sensation to the bilateral forefoot  Musculoskeletal Exam: No significant pedal deformities noted.  Muscle strength 5/5 all compartments.  Radiographic Exam:  Joint spaces preserved.  Diffuse osseous demineralization noted with an osteoporosis consistency.  No fractures identified.  Good alignment of the radius.  Large posterior heel spurs noted bilateral best seen on lateral views  Assessment: 1.  Morton's metatarsalgia bilateral forefoot   Plan of Care:  1. Patient evaluated. X-Rays reviewed.  2.  Prescription for gabapentin 100 mg nightly 3.  Recommend that the patient begin wearing good supportive shoes and sneakers.  Recommend not going barefoot 4.  Return to clinic as needed      Edrick Kins, DPM [facility]  Dr. Edrick Kins, DPM    [address], [place] [address]                Office [phone]  Fax [phone]

## 2021-02-10 DIAGNOSIS — Z1231 Encounter for screening mammogram for malignant neoplasm of breast: Secondary | ICD-10-CM | POA: Diagnosis not present

## 2021-02-27 ENCOUNTER — Other Ambulatory Visit: Payer: Self-pay | Admitting: Family Medicine

## 2021-02-27 DIAGNOSIS — F329 Major depressive disorder, single episode, unspecified: Secondary | ICD-10-CM

## 2021-05-26 DIAGNOSIS — H43812 Vitreous degeneration, left eye: Secondary | ICD-10-CM | POA: Diagnosis not present

## 2021-05-26 DIAGNOSIS — H353132 Nonexudative age-related macular degeneration, bilateral, intermediate dry stage: Secondary | ICD-10-CM | POA: Diagnosis not present

## 2021-05-26 DIAGNOSIS — H524 Presbyopia: Secondary | ICD-10-CM | POA: Diagnosis not present

## 2021-05-26 DIAGNOSIS — H35373 Puckering of macula, bilateral: Secondary | ICD-10-CM | POA: Diagnosis not present

## 2021-05-26 DIAGNOSIS — H18513 Endothelial corneal dystrophy, bilateral: Secondary | ICD-10-CM | POA: Diagnosis not present

## 2021-07-28 ENCOUNTER — Other Ambulatory Visit: Payer: Self-pay

## 2021-07-28 ENCOUNTER — Encounter: Payer: Self-pay | Admitting: Dermatology

## 2021-07-28 ENCOUNTER — Ambulatory Visit: Payer: PPO | Admitting: Dermatology

## 2021-07-28 DIAGNOSIS — L820 Inflamed seborrheic keratosis: Secondary | ICD-10-CM | POA: Diagnosis not present

## 2021-07-28 DIAGNOSIS — L570 Actinic keratosis: Secondary | ICD-10-CM | POA: Diagnosis not present

## 2021-07-28 DIAGNOSIS — B078 Other viral warts: Secondary | ICD-10-CM

## 2021-07-28 DIAGNOSIS — D485 Neoplasm of uncertain behavior of skin: Secondary | ICD-10-CM

## 2021-07-28 DIAGNOSIS — Z85828 Personal history of other malignant neoplasm of skin: Secondary | ICD-10-CM

## 2021-07-28 NOTE — Patient Instructions (Signed)

## 2021-08-09 DIAGNOSIS — H04123 Dry eye syndrome of bilateral lacrimal glands: Secondary | ICD-10-CM | POA: Diagnosis not present

## 2021-08-09 DIAGNOSIS — H1045 Other chronic allergic conjunctivitis: Secondary | ICD-10-CM | POA: Diagnosis not present

## 2021-08-09 DIAGNOSIS — H18513 Endothelial corneal dystrophy, bilateral: Secondary | ICD-10-CM | POA: Diagnosis not present

## 2021-08-26 ENCOUNTER — Encounter: Payer: Self-pay | Admitting: Dermatology

## 2021-08-26 NOTE — Progress Notes (Addendum)
° °  Follow-Up Visit   Subjective  Barbara Soto is a 81 y.o. female who presents for the following: Skin Problem (Right arm, right thigh & left arm- scaly spots).  Recheck site skin cancer, several new crusts Location:  Duration:  Quality:  Associated Signs/Symptoms: Modifying Factors:  Severity:  Timing: Context:   Objective  Well appearing patient in no apparent distress; mood and affect are within normal limits. Right Upper Arm - Posterior 1 cm pink-tan crust       Left Upper Arm - Posterior 1 cm waxy pink crust       Right Thigh - Posterior 9 mm flat pearly papule with telangiectasia       Left Nasal Sidewall 3 mm gritty pink crust  Anterior Mid Neck No sign of recurrent carcinoma    A focused examination was performed including head, neck, arms, legs.. Relevant physical exam findings are noted in the Assessment and Plan.   Assessment & Plan    Neoplasm of uncertain behavior of skin (3) Right Upper Arm - Posterior  Skin / nail biopsy Type of biopsy: tangential   Informed consent: discussed and consent obtained   Timeout: patient name, date of birth, surgical site, and procedure verified   Anesthesia: the lesion was anesthetized in a standard fashion   Anesthetic:  1% lidocaine w/ epinephrine 1-100,000 local infiltration Instrument used: flexible razor blade   Hemostasis achieved with: aluminum chloride and electrodesiccation   Outcome: patient tolerated procedure well   Post-procedure details: wound care instructions given    Specimen 1 - Surgical pathology Differential Diagnosis: R/O BCC VS SCC  Check Margins: No  Left Upper Arm - Posterior  Skin / nail biopsy Type of biopsy: tangential   Informed consent: discussed and consent obtained   Timeout: patient name, date of birth, surgical site, and procedure verified   Anesthesia: the lesion was anesthetized in a standard fashion   Anesthetic:  1% lidocaine w/ epinephrine 1-100,000  local infiltration Instrument used: flexible razor blade   Hemostasis achieved with: aluminum chloride and electrodesiccation   Outcome: patient tolerated procedure well   Post-procedure details: wound care instructions given    Specimen 2 - Surgical pathology Differential Diagnosis: R/O BCC VS SCC  Check Margins: No  Right Thigh - Posterior  Skin / nail biopsy Type of biopsy: tangential   Informed consent: discussed and consent obtained   Timeout: patient name, date of birth, surgical site, and procedure verified   Anesthesia: the lesion was anesthetized in a standard fashion   Anesthetic:  1% lidocaine w/ epinephrine 1-100,000 local infiltration Instrument used: flexible razor blade   Hemostasis achieved with: aluminum chloride and electrodesiccation   Outcome: patient tolerated procedure well   Post-procedure details: wound care instructions given    Specimen 3 - Surgical pathology Differential Diagnosis: R/O SCC VS BCC  Check Margins: No  Actinic keratosis Left Nasal Sidewall  Destruction of lesion - Left Nasal Sidewall Complexity: simple   Destruction method: cryotherapy   Informed consent: discussed and consent obtained   Lesion destroyed using liquid nitrogen: Yes   Cryotherapy cycles:  3 Outcome: patient tolerated procedure well with no complications    Personal history of skin cancer Anterior Mid Neck  Check as needed change      I, Lavonna Monarch, MD, have reviewed all documentation for this visit.  The documentation on 09/20/21 for the exam, diagnosis, procedures, and orders are all accurate and complete.

## 2021-09-03 DIAGNOSIS — R7303 Prediabetes: Secondary | ICD-10-CM | POA: Diagnosis not present

## 2021-09-03 DIAGNOSIS — I10 Essential (primary) hypertension: Secondary | ICD-10-CM | POA: Diagnosis not present

## 2021-09-07 DIAGNOSIS — Z780 Asymptomatic menopausal state: Secondary | ICD-10-CM | POA: Diagnosis not present

## 2021-09-10 DIAGNOSIS — Z85828 Personal history of other malignant neoplasm of skin: Secondary | ICD-10-CM | POA: Diagnosis not present

## 2021-09-10 DIAGNOSIS — I10 Essential (primary) hypertension: Secondary | ICD-10-CM | POA: Diagnosis not present

## 2021-09-10 DIAGNOSIS — R7303 Prediabetes: Secondary | ICD-10-CM | POA: Diagnosis not present

## 2021-09-10 DIAGNOSIS — Z96641 Presence of right artificial hip joint: Secondary | ICD-10-CM | POA: Diagnosis not present

## 2021-09-10 DIAGNOSIS — M1611 Unilateral primary osteoarthritis, right hip: Secondary | ICD-10-CM | POA: Diagnosis not present

## 2021-09-10 DIAGNOSIS — M8580 Other specified disorders of bone density and structure, unspecified site: Secondary | ICD-10-CM | POA: Diagnosis not present

## 2021-09-10 DIAGNOSIS — E785 Hyperlipidemia, unspecified: Secondary | ICD-10-CM | POA: Diagnosis not present

## 2021-09-10 DIAGNOSIS — Z96649 Presence of unspecified artificial hip joint: Secondary | ICD-10-CM | POA: Diagnosis not present

## 2021-09-10 DIAGNOSIS — G4700 Insomnia, unspecified: Secondary | ICD-10-CM | POA: Diagnosis not present

## 2021-09-10 DIAGNOSIS — R8281 Pyuria: Secondary | ICD-10-CM | POA: Diagnosis not present

## 2021-09-10 DIAGNOSIS — Z0000 Encounter for general adult medical examination without abnormal findings: Secondary | ICD-10-CM | POA: Diagnosis not present

## 2021-09-10 DIAGNOSIS — F321 Major depressive disorder, single episode, moderate: Secondary | ICD-10-CM | POA: Diagnosis not present

## 2021-09-14 NOTE — Addendum Note (Signed)
Addended by: Lennie Odor on: 09/14/2021 11:38 AM   Modules accepted: Orders

## 2021-11-10 ENCOUNTER — Ambulatory Visit: Payer: PPO | Admitting: Dermatology

## 2021-11-10 DIAGNOSIS — H18513 Endothelial corneal dystrophy, bilateral: Secondary | ICD-10-CM | POA: Diagnosis not present

## 2021-11-10 DIAGNOSIS — Z961 Presence of intraocular lens: Secondary | ICD-10-CM | POA: Diagnosis not present

## 2021-11-23 IMAGING — DX DG CHEST 2V
2 series · 2 of 2 positions shown · non-contrast
Comparison: 03/07/2018

CLINICAL DATA: Cough

EXAM:
CHEST - 2 VIEW

[chest pa]
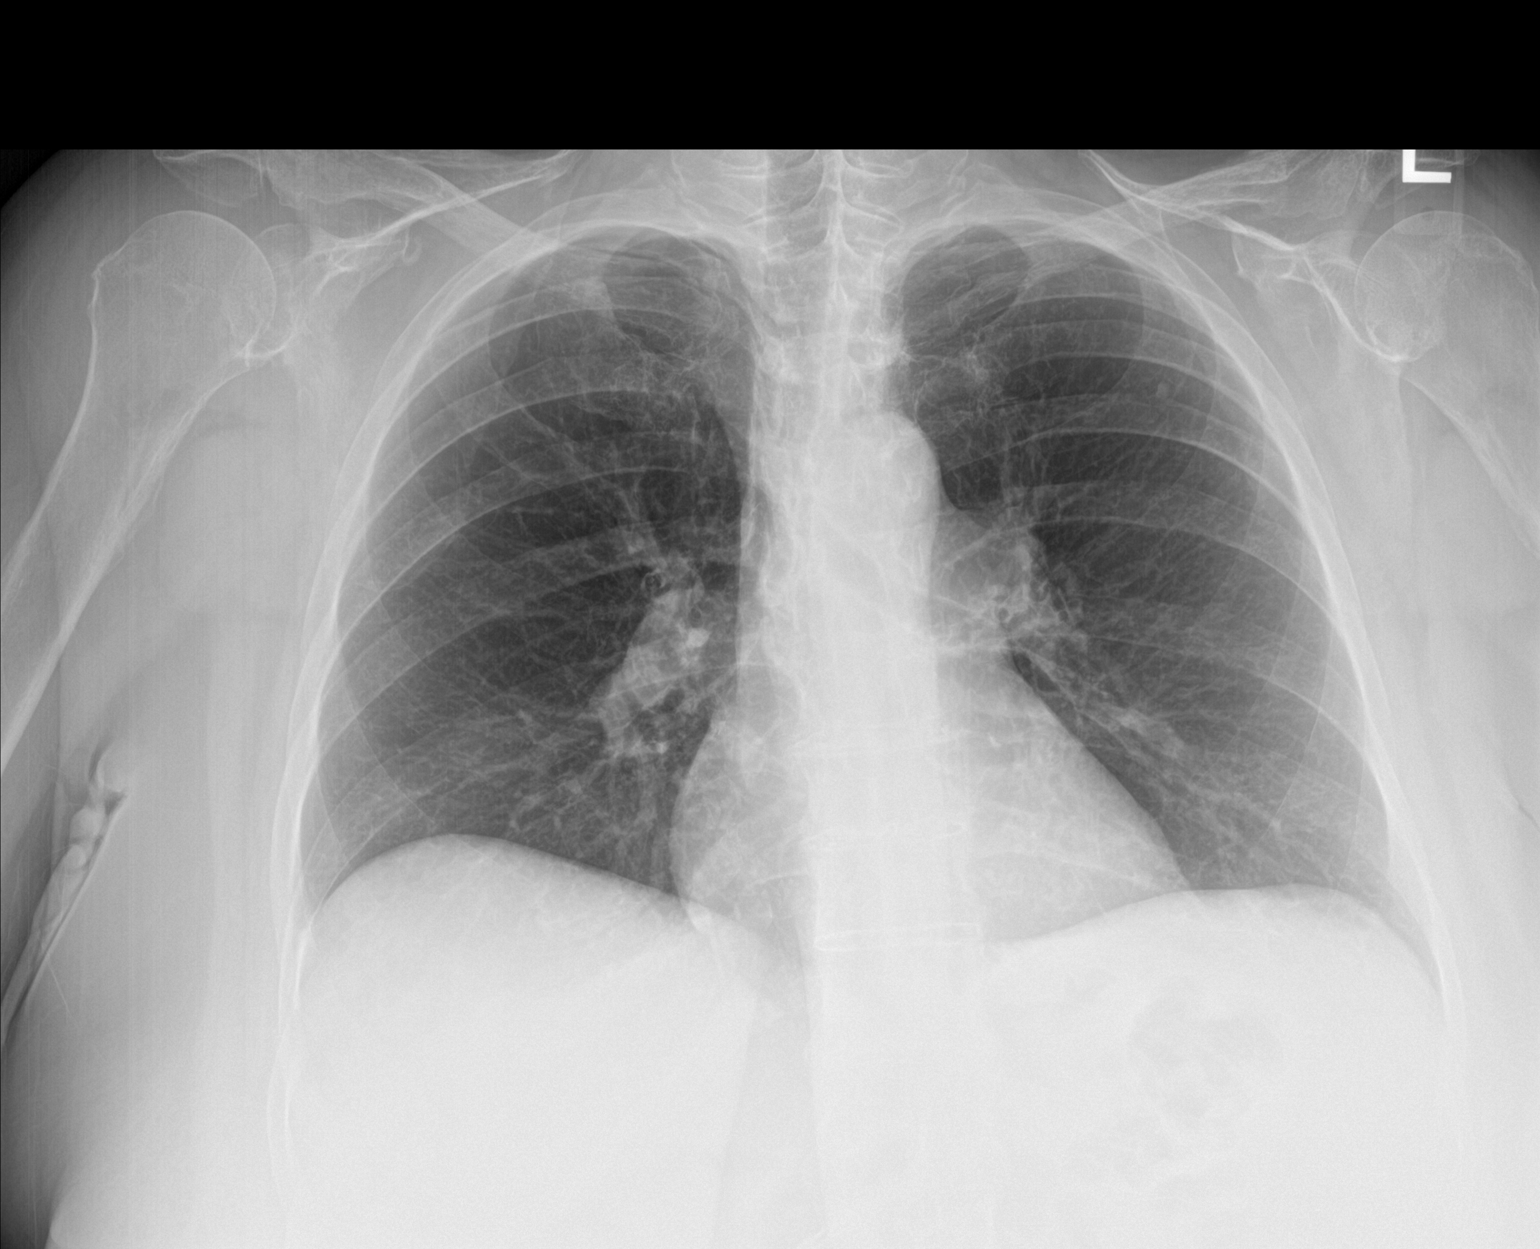

[chest lat]
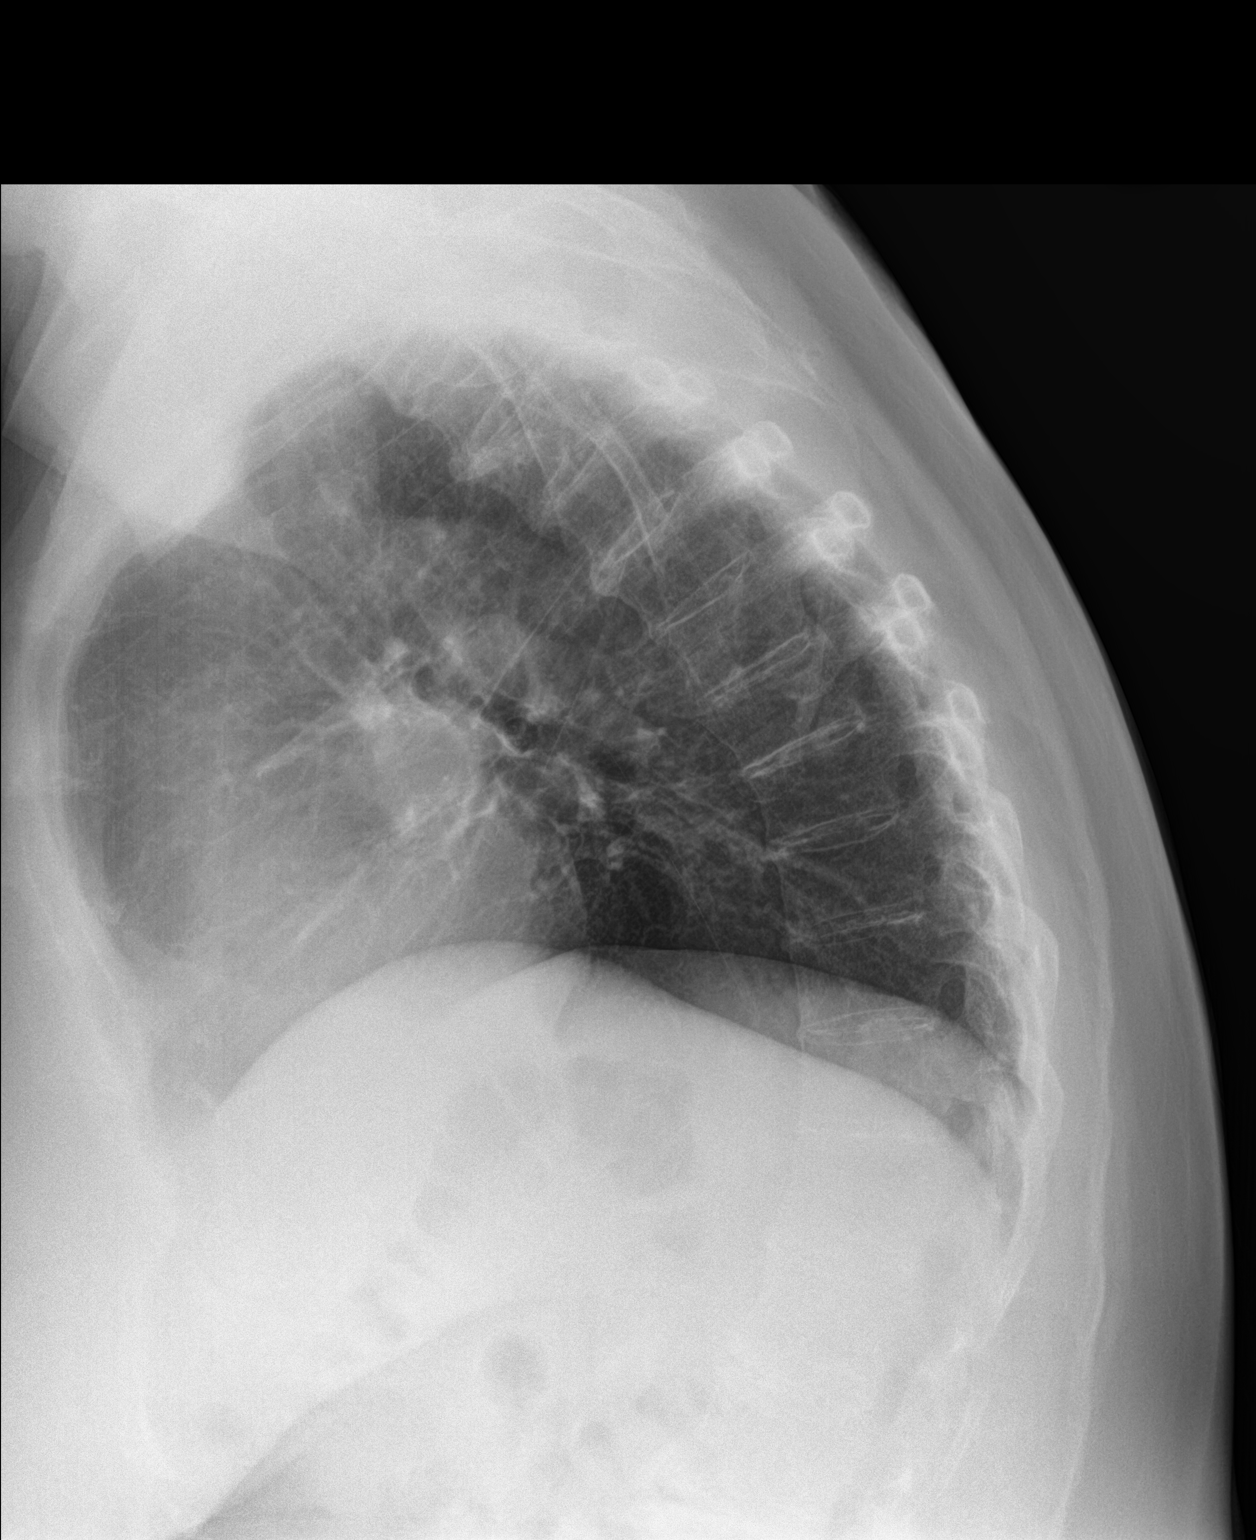

[2 of 2 positions shown; findings below may reference images not displayed]

FINDINGS: The heart size and mediastinal contours are within normal limits.
Both lungs are clear. The visualized skeletal structures are
unremarkable.
IMPRESSION: No active cardiopulmonary disease.

## 2021-11-24 ENCOUNTER — Encounter: Payer: Self-pay | Admitting: Dermatology

## 2021-11-24 ENCOUNTER — Ambulatory Visit: Payer: PPO | Admitting: Dermatology

## 2021-11-24 DIAGNOSIS — B079 Viral wart, unspecified: Secondary | ICD-10-CM | POA: Diagnosis not present

## 2021-11-24 DIAGNOSIS — D485 Neoplasm of uncertain behavior of skin: Secondary | ICD-10-CM

## 2021-11-24 DIAGNOSIS — L570 Actinic keratosis: Secondary | ICD-10-CM

## 2021-11-24 NOTE — Patient Instructions (Signed)

## 2021-12-12 ENCOUNTER — Encounter: Payer: Self-pay | Admitting: Dermatology

## 2021-12-12 NOTE — Progress Notes (Signed)
? ?  Follow-Up Visit ?  ?Subjective  ?Barbara Soto is a 81 y.o. female who presents for the following: Skin Problem (Places on face- just wants checked). ? ?Enlarged growth under her chin, several growths on face ?Location:  ?Duration:  ?Quality:  ?Associated Signs/Symptoms: ?Modifying Factors:  ?Severity:  ?Timing: ?Context:  ? ?Objective  ?Well appearing patient in no apparent distress; mood and affect are within normal limits. ?Right Submental Area ?Verrucous flesh-colored 3 mm papule ? ? ? ? ? ? ?Left Zygomatic Area (3) ?4 mm gritty pink crusts (3) ? ? ? ?A focused examination was performed including head, neck, back, arms. Relevant physical exam findings are noted in the Assessment and Plan. ? ? ?Assessment & Plan  ? ? ?Neoplasm of uncertain behavior of skin ?Right Submental Area ? ?Skin / nail biopsy ?Type of biopsy: tangential   ?Informed consent: discussed and consent obtained   ?Timeout: patient name, date of birth, surgical site, and procedure verified   ?Anesthesia: the lesion was anesthetized in a standard fashion   ?Anesthetic:  1% lidocaine w/ epinephrine 1-100,000 local infiltration ?Instrument used: flexible razor blade   ?Hemostasis achieved with: ferric subsulfate   ?Outcome: patient tolerated procedure well   ?Post-procedure details: wound care instructions given   ? ?Specimen 1 - Surgical pathology ?Differential Diagnosis: scc vs bcc ? ?Check Margins: No ? ?AK (actinic keratosis) (3) ?Left Zygomatic Area ? ?Destruction of lesion - Left Zygomatic Area ?Complexity: simple   ?Destruction method: cryotherapy   ?Informed consent: discussed and consent obtained   ?Timeout:  patient name, date of birth, surgical site, and procedure verified ?Lesion destroyed using liquid nitrogen: Yes   ?Cryotherapy cycles:  3 ?Outcome: patient tolerated procedure well with no complications   ?Post-procedure details: wound care instructions given   ? ? ? ? ? ?I, Lavonna Monarch, MD, have reviewed all documentation for  this visit.  The documentation on 12/12/21 for the exam, diagnosis, procedures, and orders are all accurate and complete. ?

## 2021-12-29 DIAGNOSIS — Z961 Presence of intraocular lens: Secondary | ICD-10-CM | POA: Diagnosis not present

## 2021-12-29 DIAGNOSIS — H18513 Endothelial corneal dystrophy, bilateral: Secondary | ICD-10-CM | POA: Diagnosis not present

## 2022-02-25 DIAGNOSIS — Z1231 Encounter for screening mammogram for malignant neoplasm of breast: Secondary | ICD-10-CM | POA: Diagnosis not present

## 2022-04-06 DIAGNOSIS — M1611 Unilateral primary osteoarthritis, right hip: Secondary | ICD-10-CM | POA: Diagnosis not present

## 2022-04-06 DIAGNOSIS — M8580 Other specified disorders of bone density and structure, unspecified site: Secondary | ICD-10-CM | POA: Diagnosis not present

## 2022-04-06 DIAGNOSIS — Z96641 Presence of right artificial hip joint: Secondary | ICD-10-CM | POA: Diagnosis not present

## 2022-04-06 DIAGNOSIS — E785 Hyperlipidemia, unspecified: Secondary | ICD-10-CM | POA: Diagnosis not present

## 2022-04-06 DIAGNOSIS — G4700 Insomnia, unspecified: Secondary | ICD-10-CM | POA: Diagnosis not present

## 2022-04-06 DIAGNOSIS — R7303 Prediabetes: Secondary | ICD-10-CM | POA: Diagnosis not present

## 2022-04-06 DIAGNOSIS — T8484XD Pain due to internal orthopedic prosthetic devices, implants and grafts, subsequent encounter: Secondary | ICD-10-CM | POA: Diagnosis not present

## 2022-04-06 DIAGNOSIS — I10 Essential (primary) hypertension: Secondary | ICD-10-CM | POA: Diagnosis not present

## 2022-04-06 DIAGNOSIS — F321 Major depressive disorder, single episode, moderate: Secondary | ICD-10-CM | POA: Diagnosis not present

## 2022-05-27 DIAGNOSIS — H04123 Dry eye syndrome of bilateral lacrimal glands: Secondary | ICD-10-CM | POA: Diagnosis not present

## 2022-05-27 DIAGNOSIS — H1045 Other chronic allergic conjunctivitis: Secondary | ICD-10-CM | POA: Diagnosis not present

## 2022-05-27 DIAGNOSIS — H18513 Endothelial corneal dystrophy, bilateral: Secondary | ICD-10-CM | POA: Diagnosis not present

## 2022-05-27 DIAGNOSIS — H353132 Nonexudative age-related macular degeneration, bilateral, intermediate dry stage: Secondary | ICD-10-CM | POA: Diagnosis not present

## 2022-05-27 DIAGNOSIS — H35373 Puckering of macula, bilateral: Secondary | ICD-10-CM | POA: Diagnosis not present

## 2022-06-27 DIAGNOSIS — Z961 Presence of intraocular lens: Secondary | ICD-10-CM | POA: Diagnosis not present

## 2022-06-27 DIAGNOSIS — H18513 Endothelial corneal dystrophy, bilateral: Secondary | ICD-10-CM | POA: Diagnosis not present

## 2022-08-03 ENCOUNTER — Ambulatory Visit: Payer: PPO | Admitting: Dermatology

## 2022-09-06 DIAGNOSIS — R7989 Other specified abnormal findings of blood chemistry: Secondary | ICD-10-CM | POA: Diagnosis not present

## 2022-09-06 DIAGNOSIS — I10 Essential (primary) hypertension: Secondary | ICD-10-CM | POA: Diagnosis not present

## 2022-09-06 DIAGNOSIS — E785 Hyperlipidemia, unspecified: Secondary | ICD-10-CM | POA: Diagnosis not present

## 2022-09-06 DIAGNOSIS — R7303 Prediabetes: Secondary | ICD-10-CM | POA: Diagnosis not present

## 2022-09-06 DIAGNOSIS — D649 Anemia, unspecified: Secondary | ICD-10-CM | POA: Diagnosis not present

## 2022-09-06 DIAGNOSIS — M8580 Other specified disorders of bone density and structure, unspecified site: Secondary | ICD-10-CM | POA: Diagnosis not present

## 2022-09-13 DIAGNOSIS — Z0000 Encounter for general adult medical examination without abnormal findings: Secondary | ICD-10-CM | POA: Diagnosis not present

## 2022-09-13 DIAGNOSIS — R82998 Other abnormal findings in urine: Secondary | ICD-10-CM | POA: Diagnosis not present

## 2022-09-13 DIAGNOSIS — F321 Major depressive disorder, single episode, moderate: Secondary | ICD-10-CM | POA: Diagnosis not present

## 2022-09-13 DIAGNOSIS — Z96649 Presence of unspecified artificial hip joint: Secondary | ICD-10-CM | POA: Diagnosis not present

## 2022-09-13 DIAGNOSIS — G4700 Insomnia, unspecified: Secondary | ICD-10-CM | POA: Diagnosis not present

## 2022-09-13 DIAGNOSIS — E611 Iron deficiency: Secondary | ICD-10-CM | POA: Diagnosis not present

## 2022-09-13 DIAGNOSIS — Z85828 Personal history of other malignant neoplasm of skin: Secondary | ICD-10-CM | POA: Diagnosis not present

## 2022-09-13 DIAGNOSIS — M8580 Other specified disorders of bone density and structure, unspecified site: Secondary | ICD-10-CM | POA: Diagnosis not present

## 2022-09-13 DIAGNOSIS — E785 Hyperlipidemia, unspecified: Secondary | ICD-10-CM | POA: Diagnosis not present

## 2022-09-13 DIAGNOSIS — T8484XD Pain due to internal orthopedic prosthetic devices, implants and grafts, subsequent encounter: Secondary | ICD-10-CM | POA: Diagnosis not present

## 2022-09-13 DIAGNOSIS — M25531 Pain in right wrist: Secondary | ICD-10-CM | POA: Diagnosis not present

## 2022-09-13 DIAGNOSIS — I10 Essential (primary) hypertension: Secondary | ICD-10-CM | POA: Diagnosis not present

## 2022-11-10 DIAGNOSIS — I10 Essential (primary) hypertension: Secondary | ICD-10-CM | POA: Diagnosis not present

## 2022-11-10 DIAGNOSIS — E785 Hyperlipidemia, unspecified: Secondary | ICD-10-CM | POA: Diagnosis not present

## 2022-11-10 DIAGNOSIS — R7303 Prediabetes: Secondary | ICD-10-CM | POA: Diagnosis not present

## 2023-02-28 DIAGNOSIS — Z1231 Encounter for screening mammogram for malignant neoplasm of breast: Secondary | ICD-10-CM | POA: Diagnosis not present

## 2023-03-15 DIAGNOSIS — M8580 Other specified disorders of bone density and structure, unspecified site: Secondary | ICD-10-CM | POA: Diagnosis not present

## 2023-03-15 DIAGNOSIS — E611 Iron deficiency: Secondary | ICD-10-CM | POA: Diagnosis not present

## 2023-03-15 DIAGNOSIS — M1611 Unilateral primary osteoarthritis, right hip: Secondary | ICD-10-CM | POA: Diagnosis not present

## 2023-03-15 DIAGNOSIS — M545 Low back pain, unspecified: Secondary | ICD-10-CM | POA: Diagnosis not present

## 2023-03-15 DIAGNOSIS — T8484XD Pain due to internal orthopedic prosthetic devices, implants and grafts, subsequent encounter: Secondary | ICD-10-CM | POA: Diagnosis not present

## 2023-03-15 DIAGNOSIS — M25531 Pain in right wrist: Secondary | ICD-10-CM | POA: Diagnosis not present

## 2023-03-15 DIAGNOSIS — F321 Major depressive disorder, single episode, moderate: Secondary | ICD-10-CM | POA: Diagnosis not present

## 2023-03-15 DIAGNOSIS — E785 Hyperlipidemia, unspecified: Secondary | ICD-10-CM | POA: Diagnosis not present

## 2023-03-15 DIAGNOSIS — D649 Anemia, unspecified: Secondary | ICD-10-CM | POA: Diagnosis not present

## 2023-03-15 DIAGNOSIS — R296 Repeated falls: Secondary | ICD-10-CM | POA: Diagnosis not present

## 2023-03-15 DIAGNOSIS — M8589 Other specified disorders of bone density and structure, multiple sites: Secondary | ICD-10-CM | POA: Diagnosis not present

## 2023-03-15 DIAGNOSIS — I10 Essential (primary) hypertension: Secondary | ICD-10-CM | POA: Diagnosis not present

## 2023-04-06 DIAGNOSIS — R252 Cramp and spasm: Secondary | ICD-10-CM | POA: Diagnosis not present

## 2023-04-06 DIAGNOSIS — I87393 Chronic venous hypertension (idiopathic) with other complications of bilateral lower extremity: Secondary | ICD-10-CM | POA: Diagnosis not present

## 2023-04-06 DIAGNOSIS — M7989 Other specified soft tissue disorders: Secondary | ICD-10-CM | POA: Diagnosis not present

## 2023-04-06 DIAGNOSIS — I83892 Varicose veins of left lower extremities with other complications: Secondary | ICD-10-CM | POA: Diagnosis not present

## 2023-04-11 DIAGNOSIS — M545 Low back pain, unspecified: Secondary | ICD-10-CM | POA: Diagnosis not present

## 2023-04-11 DIAGNOSIS — M47896 Other spondylosis, lumbar region: Secondary | ICD-10-CM | POA: Diagnosis not present

## 2023-04-27 DIAGNOSIS — I872 Venous insufficiency (chronic) (peripheral): Secondary | ICD-10-CM | POA: Diagnosis not present

## 2023-05-03 DIAGNOSIS — Z09 Encounter for follow-up examination after completed treatment for conditions other than malignant neoplasm: Secondary | ICD-10-CM | POA: Diagnosis not present

## 2023-05-03 DIAGNOSIS — I83892 Varicose veins of left lower extremities with other complications: Secondary | ICD-10-CM | POA: Diagnosis not present

## 2023-05-06 DIAGNOSIS — Z23 Encounter for immunization: Secondary | ICD-10-CM | POA: Diagnosis not present

## 2023-05-17 DIAGNOSIS — I83812 Varicose veins of left lower extremities with pain: Secondary | ICD-10-CM | POA: Diagnosis not present

## 2023-06-01 DIAGNOSIS — H35373 Puckering of macula, bilateral: Secondary | ICD-10-CM | POA: Diagnosis not present

## 2023-06-01 DIAGNOSIS — H353132 Nonexudative age-related macular degeneration, bilateral, intermediate dry stage: Secondary | ICD-10-CM | POA: Diagnosis not present

## 2023-06-01 DIAGNOSIS — H18513 Endothelial corneal dystrophy, bilateral: Secondary | ICD-10-CM | POA: Diagnosis not present

## 2023-06-01 DIAGNOSIS — H04123 Dry eye syndrome of bilateral lacrimal glands: Secondary | ICD-10-CM | POA: Diagnosis not present

## 2023-07-19 ENCOUNTER — Ambulatory Visit: Payer: PPO | Admitting: Dermatology

## 2023-08-08 DIAGNOSIS — R600 Localized edema: Secondary | ICD-10-CM | POA: Diagnosis not present

## 2023-08-08 DIAGNOSIS — I10 Essential (primary) hypertension: Secondary | ICD-10-CM | POA: Diagnosis not present

## 2023-08-08 DIAGNOSIS — R0609 Other forms of dyspnea: Secondary | ICD-10-CM | POA: Diagnosis not present

## 2023-08-08 DIAGNOSIS — I872 Venous insufficiency (chronic) (peripheral): Secondary | ICD-10-CM | POA: Diagnosis not present

## 2023-08-09 DIAGNOSIS — I10 Essential (primary) hypertension: Secondary | ICD-10-CM | POA: Diagnosis not present

## 2023-08-09 DIAGNOSIS — R600 Localized edema: Secondary | ICD-10-CM | POA: Diagnosis not present

## 2023-08-15 DIAGNOSIS — I10 Essential (primary) hypertension: Secondary | ICD-10-CM | POA: Diagnosis not present

## 2023-08-15 DIAGNOSIS — R600 Localized edema: Secondary | ICD-10-CM | POA: Diagnosis not present

## 2023-08-15 DIAGNOSIS — R0609 Other forms of dyspnea: Secondary | ICD-10-CM | POA: Diagnosis not present

## 2023-08-15 DIAGNOSIS — I872 Venous insufficiency (chronic) (peripheral): Secondary | ICD-10-CM | POA: Diagnosis not present

## 2023-09-18 ENCOUNTER — Ambulatory Visit: Payer: PPO | Attending: Cardiology | Admitting: Cardiology

## 2023-10-12 DIAGNOSIS — L989 Disorder of the skin and subcutaneous tissue, unspecified: Secondary | ICD-10-CM | POA: Diagnosis not present

## 2023-10-12 DIAGNOSIS — C4492 Squamous cell carcinoma of skin, unspecified: Secondary | ICD-10-CM | POA: Diagnosis not present

## 2023-11-08 DIAGNOSIS — Z85828 Personal history of other malignant neoplasm of skin: Secondary | ICD-10-CM | POA: Diagnosis not present

## 2023-11-08 DIAGNOSIS — Z08 Encounter for follow-up examination after completed treatment for malignant neoplasm: Secondary | ICD-10-CM | POA: Diagnosis not present

## 2023-11-08 DIAGNOSIS — C44729 Squamous cell carcinoma of skin of left lower limb, including hip: Secondary | ICD-10-CM | POA: Diagnosis not present

## 2023-11-08 DIAGNOSIS — L578 Other skin changes due to chronic exposure to nonionizing radiation: Secondary | ICD-10-CM | POA: Diagnosis not present

## 2023-11-08 DIAGNOSIS — D485 Neoplasm of uncertain behavior of skin: Secondary | ICD-10-CM | POA: Diagnosis not present

## 2023-11-16 DIAGNOSIS — D649 Anemia, unspecified: Secondary | ICD-10-CM | POA: Diagnosis not present

## 2023-11-16 DIAGNOSIS — E785 Hyperlipidemia, unspecified: Secondary | ICD-10-CM | POA: Diagnosis not present

## 2023-11-20 DIAGNOSIS — M8580 Other specified disorders of bone density and structure, unspecified site: Secondary | ICD-10-CM | POA: Diagnosis not present

## 2023-11-20 DIAGNOSIS — D649 Anemia, unspecified: Secondary | ICD-10-CM | POA: Diagnosis not present

## 2023-11-20 DIAGNOSIS — I10 Essential (primary) hypertension: Secondary | ICD-10-CM | POA: Diagnosis not present

## 2023-11-20 DIAGNOSIS — R7303 Prediabetes: Secondary | ICD-10-CM | POA: Diagnosis not present

## 2023-11-20 DIAGNOSIS — E785 Hyperlipidemia, unspecified: Secondary | ICD-10-CM | POA: Diagnosis not present

## 2023-11-20 DIAGNOSIS — E611 Iron deficiency: Secondary | ICD-10-CM | POA: Diagnosis not present

## 2023-11-24 DIAGNOSIS — R82998 Other abnormal findings in urine: Secondary | ICD-10-CM | POA: Diagnosis not present

## 2023-11-24 DIAGNOSIS — R600 Localized edema: Secondary | ICD-10-CM | POA: Diagnosis not present

## 2023-11-24 DIAGNOSIS — M545 Low back pain, unspecified: Secondary | ICD-10-CM | POA: Diagnosis not present

## 2023-11-24 DIAGNOSIS — Z1331 Encounter for screening for depression: Secondary | ICD-10-CM | POA: Diagnosis not present

## 2023-11-24 DIAGNOSIS — Z0000 Encounter for general adult medical examination without abnormal findings: Secondary | ICD-10-CM | POA: Diagnosis not present

## 2023-11-24 DIAGNOSIS — R0609 Other forms of dyspnea: Secondary | ICD-10-CM | POA: Diagnosis not present

## 2023-11-24 DIAGNOSIS — C4492 Squamous cell carcinoma of skin, unspecified: Secondary | ICD-10-CM | POA: Diagnosis not present

## 2023-11-24 DIAGNOSIS — G4700 Insomnia, unspecified: Secondary | ICD-10-CM | POA: Diagnosis not present

## 2023-11-24 DIAGNOSIS — Z1339 Encounter for screening examination for other mental health and behavioral disorders: Secondary | ICD-10-CM | POA: Diagnosis not present

## 2023-11-24 DIAGNOSIS — I509 Heart failure, unspecified: Secondary | ICD-10-CM | POA: Diagnosis not present

## 2023-11-24 DIAGNOSIS — H9193 Unspecified hearing loss, bilateral: Secondary | ICD-10-CM | POA: Diagnosis not present

## 2023-12-04 DIAGNOSIS — M79672 Pain in left foot: Secondary | ICD-10-CM | POA: Diagnosis not present

## 2023-12-04 DIAGNOSIS — S93492A Sprain of other ligament of left ankle, initial encounter: Secondary | ICD-10-CM | POA: Diagnosis not present

## 2023-12-04 DIAGNOSIS — M25572 Pain in left ankle and joints of left foot: Secondary | ICD-10-CM | POA: Diagnosis not present

## 2023-12-06 DIAGNOSIS — C44729 Squamous cell carcinoma of skin of left lower limb, including hip: Secondary | ICD-10-CM | POA: Diagnosis not present

## 2023-12-13 DIAGNOSIS — S93492A Sprain of other ligament of left ankle, initial encounter: Secondary | ICD-10-CM | POA: Diagnosis not present

## 2023-12-20 DIAGNOSIS — C44729 Squamous cell carcinoma of skin of left lower limb, including hip: Secondary | ICD-10-CM | POA: Diagnosis not present

## 2023-12-21 DIAGNOSIS — M79672 Pain in left foot: Secondary | ICD-10-CM | POA: Diagnosis not present

## 2023-12-21 DIAGNOSIS — M25572 Pain in left ankle and joints of left foot: Secondary | ICD-10-CM | POA: Diagnosis not present

## 2023-12-21 DIAGNOSIS — S93402A Sprain of unspecified ligament of left ankle, initial encounter: Secondary | ICD-10-CM | POA: Diagnosis not present

## 2023-12-29 ENCOUNTER — Emergency Department (HOSPITAL_COMMUNITY)

## 2023-12-29 ENCOUNTER — Inpatient Hospital Stay (HOSPITAL_COMMUNITY)
Admission: EM | Admit: 2023-12-29 | Discharge: 2024-01-04 | DRG: 242 | Disposition: A | Discharge status: Home Health | Attending: Internal Medicine | Admitting: Internal Medicine

## 2023-12-29 ENCOUNTER — Encounter (HOSPITAL_COMMUNITY): Payer: Self-pay

## 2023-12-29 ENCOUNTER — Other Ambulatory Visit: Payer: Self-pay

## 2023-12-29 DIAGNOSIS — R0989 Other specified symptoms and signs involving the circulatory and respiratory systems: Secondary | ICD-10-CM | POA: Diagnosis not present

## 2023-12-29 DIAGNOSIS — I6782 Cerebral ischemia: Secondary | ICD-10-CM | POA: Diagnosis not present

## 2023-12-29 DIAGNOSIS — S40211A Abrasion of right shoulder, initial encounter: Secondary | ICD-10-CM | POA: Diagnosis present

## 2023-12-29 DIAGNOSIS — I3139 Other pericardial effusion (noninflammatory): Secondary | ICD-10-CM | POA: Diagnosis present

## 2023-12-29 DIAGNOSIS — E1122 Type 2 diabetes mellitus with diabetic chronic kidney disease: Secondary | ICD-10-CM | POA: Diagnosis present

## 2023-12-29 DIAGNOSIS — I700 Atherosclerosis of aorta: Secondary | ICD-10-CM | POA: Diagnosis present

## 2023-12-29 DIAGNOSIS — I6523 Occlusion and stenosis of bilateral carotid arteries: Secondary | ICD-10-CM | POA: Diagnosis not present

## 2023-12-29 DIAGNOSIS — R55 Syncope and collapse: Principal | ICD-10-CM | POA: Diagnosis present

## 2023-12-29 DIAGNOSIS — R4182 Altered mental status, unspecified: Secondary | ICD-10-CM | POA: Diagnosis not present

## 2023-12-29 DIAGNOSIS — I4891 Unspecified atrial fibrillation: Secondary | ICD-10-CM | POA: Diagnosis not present

## 2023-12-29 DIAGNOSIS — N1831 Chronic kidney disease, stage 3a: Secondary | ICD-10-CM | POA: Diagnosis present

## 2023-12-29 DIAGNOSIS — S80211A Abrasion, right knee, initial encounter: Secondary | ICD-10-CM | POA: Diagnosis present

## 2023-12-29 DIAGNOSIS — D631 Anemia in chronic kidney disease: Secondary | ICD-10-CM | POA: Diagnosis present

## 2023-12-29 DIAGNOSIS — Z6841 Body Mass Index (BMI) 40.0 and over, adult: Secondary | ICD-10-CM

## 2023-12-29 DIAGNOSIS — I130 Hypertensive heart and chronic kidney disease with heart failure and stage 1 through stage 4 chronic kidney disease, or unspecified chronic kidney disease: Secondary | ICD-10-CM | POA: Diagnosis present

## 2023-12-29 DIAGNOSIS — F32A Depression, unspecified: Secondary | ICD-10-CM | POA: Diagnosis present

## 2023-12-29 DIAGNOSIS — W109XXA Fall (on) (from) unspecified stairs and steps, initial encounter: Secondary | ICD-10-CM | POA: Diagnosis present

## 2023-12-29 DIAGNOSIS — S299XXA Unspecified injury of thorax, initial encounter: Secondary | ICD-10-CM | POA: Diagnosis not present

## 2023-12-29 DIAGNOSIS — R001 Bradycardia, unspecified: Secondary | ICD-10-CM | POA: Diagnosis not present

## 2023-12-29 DIAGNOSIS — Z85828 Personal history of other malignant neoplasm of skin: Secondary | ICD-10-CM

## 2023-12-29 DIAGNOSIS — R000 Tachycardia, unspecified: Secondary | ICD-10-CM | POA: Diagnosis not present

## 2023-12-29 DIAGNOSIS — Z950 Presence of cardiac pacemaker: Secondary | ICD-10-CM | POA: Diagnosis not present

## 2023-12-29 DIAGNOSIS — N179 Acute kidney failure, unspecified: Secondary | ICD-10-CM | POA: Diagnosis present

## 2023-12-29 DIAGNOSIS — Z23 Encounter for immunization: Secondary | ICD-10-CM

## 2023-12-29 DIAGNOSIS — S199XXA Unspecified injury of neck, initial encounter: Secondary | ICD-10-CM | POA: Diagnosis not present

## 2023-12-29 DIAGNOSIS — Z91040 Latex allergy status: Secondary | ICD-10-CM

## 2023-12-29 DIAGNOSIS — S00211A Abrasion of right eyelid and periocular area, initial encounter: Secondary | ICD-10-CM | POA: Diagnosis present

## 2023-12-29 DIAGNOSIS — J323 Chronic sphenoidal sinusitis: Secondary | ICD-10-CM | POA: Diagnosis not present

## 2023-12-29 DIAGNOSIS — I129 Hypertensive chronic kidney disease with stage 1 through stage 4 chronic kidney disease, or unspecified chronic kidney disease: Secondary | ICD-10-CM | POA: Diagnosis present

## 2023-12-29 DIAGNOSIS — I4819 Other persistent atrial fibrillation: Secondary | ICD-10-CM | POA: Diagnosis present

## 2023-12-29 DIAGNOSIS — J90 Pleural effusion, not elsewhere classified: Secondary | ICD-10-CM | POA: Diagnosis not present

## 2023-12-29 DIAGNOSIS — I469 Cardiac arrest, cause unspecified: Secondary | ICD-10-CM | POA: Diagnosis not present

## 2023-12-29 DIAGNOSIS — I2510 Atherosclerotic heart disease of native coronary artery without angina pectoris: Secondary | ICD-10-CM | POA: Diagnosis present

## 2023-12-29 DIAGNOSIS — Z880 Allergy status to penicillin: Secondary | ICD-10-CM

## 2023-12-29 DIAGNOSIS — I444 Left anterior fascicular block: Secondary | ICD-10-CM | POA: Diagnosis present

## 2023-12-29 DIAGNOSIS — S3991XA Unspecified injury of abdomen, initial encounter: Secondary | ICD-10-CM | POA: Diagnosis not present

## 2023-12-29 DIAGNOSIS — R569 Unspecified convulsions: Secondary | ICD-10-CM | POA: Diagnosis not present

## 2023-12-29 DIAGNOSIS — I672 Cerebral atherosclerosis: Secondary | ICD-10-CM | POA: Diagnosis not present

## 2023-12-29 DIAGNOSIS — Z96641 Presence of right artificial hip joint: Secondary | ICD-10-CM | POA: Diagnosis not present

## 2023-12-29 DIAGNOSIS — Z7982 Long term (current) use of aspirin: Secondary | ICD-10-CM

## 2023-12-29 DIAGNOSIS — E876 Hypokalemia: Secondary | ICD-10-CM | POA: Diagnosis present

## 2023-12-29 DIAGNOSIS — I495 Sick sinus syndrome: Secondary | ICD-10-CM | POA: Diagnosis not present

## 2023-12-29 DIAGNOSIS — K5730 Diverticulosis of large intestine without perforation or abscess without bleeding: Secondary | ICD-10-CM | POA: Diagnosis present

## 2023-12-29 DIAGNOSIS — Z79899 Other long term (current) drug therapy: Secondary | ICD-10-CM

## 2023-12-29 DIAGNOSIS — D6869 Other thrombophilia: Secondary | ICD-10-CM | POA: Diagnosis present

## 2023-12-29 DIAGNOSIS — I482 Chronic atrial fibrillation, unspecified: Secondary | ICD-10-CM | POA: Diagnosis present

## 2023-12-29 DIAGNOSIS — Z66 Do not resuscitate: Secondary | ICD-10-CM | POA: Diagnosis present

## 2023-12-29 DIAGNOSIS — Z713 Dietary counseling and surveillance: Secondary | ICD-10-CM

## 2023-12-29 DIAGNOSIS — S3993XA Unspecified injury of pelvis, initial encounter: Secondary | ICD-10-CM | POA: Diagnosis not present

## 2023-12-29 DIAGNOSIS — Z45018 Encounter for adjustment and management of other part of cardiac pacemaker: Secondary | ICD-10-CM

## 2023-12-29 DIAGNOSIS — R1111 Vomiting without nausea: Secondary | ICD-10-CM | POA: Diagnosis not present

## 2023-12-29 DIAGNOSIS — Z7901 Long term (current) use of anticoagulants: Secondary | ICD-10-CM

## 2023-12-29 DIAGNOSIS — J9811 Atelectasis: Secondary | ICD-10-CM | POA: Diagnosis present

## 2023-12-29 DIAGNOSIS — S0990XA Unspecified injury of head, initial encounter: Secondary | ICD-10-CM | POA: Diagnosis not present

## 2023-12-29 DIAGNOSIS — R918 Other nonspecific abnormal finding of lung field: Secondary | ICD-10-CM | POA: Diagnosis not present

## 2023-12-29 DIAGNOSIS — Z8674 Personal history of sudden cardiac arrest: Secondary | ICD-10-CM

## 2023-12-29 DIAGNOSIS — S0003XA Contusion of scalp, initial encounter: Secondary | ICD-10-CM

## 2023-12-29 DIAGNOSIS — M1612 Unilateral primary osteoarthritis, left hip: Secondary | ICD-10-CM | POA: Diagnosis not present

## 2023-12-29 LAB — PROTIME-INR
INR: 1.2 (ref 0.8–1.2)
Prothrombin Time: 15.1 s (ref 11.4–15.2)

## 2023-12-29 LAB — I-STAT CHEM 8, ED
BUN: 18 mg/dL (ref 8–23)
Calcium, Ion: 0.96 mmol/L — ABNORMAL LOW (ref 1.15–1.40)
Chloride: 102 mmol/L (ref 98–111)
Creatinine, Ser: 1.3 mg/dL — ABNORMAL HIGH (ref 0.44–1.00)
Glucose, Bld: 132 mg/dL — ABNORMAL HIGH (ref 70–99)
HCT: 32 % — ABNORMAL LOW (ref 36.0–46.0)
Hemoglobin: 10.9 g/dL — ABNORMAL LOW (ref 12.0–15.0)
Potassium: 3.7 mmol/L (ref 3.5–5.1)
Sodium: 139 mmol/L (ref 135–145)
TCO2: 24 mmol/L (ref 22–32)

## 2023-12-29 LAB — CBC
HCT: 33.6 % — ABNORMAL LOW (ref 36.0–46.0)
Hemoglobin: 10.7 g/dL — ABNORMAL LOW (ref 12.0–15.0)
MCH: 29.4 pg (ref 26.0–34.0)
MCHC: 31.8 g/dL (ref 30.0–36.0)
MCV: 92.3 fL (ref 80.0–100.0)
Platelets: 181 10*3/uL (ref 150–400)
RBC: 3.64 MIL/uL — ABNORMAL LOW (ref 3.87–5.11)
RDW: 14.6 % (ref 11.5–15.5)
WBC: 5.4 10*3/uL (ref 4.0–10.5)
nRBC: 0 % (ref 0.0–0.2)

## 2023-12-29 LAB — URINALYSIS, ROUTINE W REFLEX MICROSCOPIC
Bacteria, UA: NONE SEEN
Bilirubin Urine: NEGATIVE
Glucose, UA: NEGATIVE mg/dL
Hgb urine dipstick: NEGATIVE
Ketones, ur: NEGATIVE mg/dL
Nitrite: NEGATIVE
Protein, ur: NEGATIVE mg/dL
Specific Gravity, Urine: 1.027 (ref 1.005–1.030)
pH: 6 (ref 5.0–8.0)

## 2023-12-29 LAB — COMPREHENSIVE METABOLIC PANEL WITH GFR
ALT: 14 U/L (ref 0–44)
AST: 30 U/L (ref 15–41)
Albumin: 3.7 g/dL (ref 3.5–5.0)
Alkaline Phosphatase: 85 U/L (ref 38–126)
Anion gap: 13 (ref 5–15)
BUN: 16 mg/dL (ref 8–23)
CO2: 24 mmol/L (ref 22–32)
Calcium: 8.6 mg/dL — ABNORMAL LOW (ref 8.9–10.3)
Chloride: 102 mmol/L (ref 98–111)
Creatinine, Ser: 1.21 mg/dL — ABNORMAL HIGH (ref 0.44–1.00)
GFR, Estimated: 45 mL/min — ABNORMAL LOW (ref >60)
Glucose, Bld: 132 mg/dL — ABNORMAL HIGH (ref 70–99)
Potassium: 3.9 mmol/L (ref 3.5–5.1)
Sodium: 139 mmol/L (ref 135–145)
Total Bilirubin: 0.9 mg/dL (ref 0.0–1.2)
Total Protein: 6.6 g/dL (ref 6.5–8.1)

## 2023-12-29 LAB — I-STAT CG4 LACTIC ACID, ED: Lactic Acid, Venous: 1.8 mmol/L (ref 0.5–1.9)

## 2023-12-29 LAB — ETHANOL: Alcohol, Ethyl (B): <15 mg/dL (ref <15)

## 2023-12-29 LAB — BRAIN NATRIURETIC PEPTIDE: B Natriuretic Peptide: 182.3 pg/mL — ABNORMAL HIGH (ref 0.0–100.0)

## 2023-12-29 LAB — SAMPLE TO BLOOD BANK

## 2023-12-29 LAB — TROPONIN I (HIGH SENSITIVITY)
Troponin I (High Sensitivity): 7 ng/L (ref <18)
Troponin I (High Sensitivity): 9 ng/L (ref <18)

## 2023-12-29 LAB — TSH: TSH: 0.656 u[IU]/mL (ref 0.350–4.500)

## 2023-12-29 MED ORDER — ACETAMINOPHEN 325 MG PO TABS
650.0000 mg | ORAL_TABLET | Freq: Four times a day (QID) | ORAL | Status: DC | PRN
  Administered 2023-12-30 – 2024-01-01 (×5): 650 mg via ORAL
  Filled 2023-12-29 (×6): qty 2

## 2023-12-29 MED ORDER — HEPARIN BOLUS VIA INFUSION
3500.0000 [IU] | Freq: Once | INTRAVENOUS | Status: AC
  Administered 2023-12-29: 3500 [IU] via INTRAVENOUS
  Filled 2023-12-29: qty 3500

## 2023-12-29 MED ORDER — TETANUS-DIPHTH-ACELL PERTUSSIS 5-2.5-18.5 LF-MCG/0.5 IM SUSY
0.5000 mL | PREFILLED_SYRINGE | Freq: Once | INTRAMUSCULAR | Status: AC
  Administered 2023-12-29: 0.5 mL via INTRAMUSCULAR

## 2023-12-29 MED ORDER — METOPROLOL TARTRATE 25 MG PO TABS: 25.0000 mg | ORAL_TABLET | Freq: Two times a day (BID) | ORAL | Status: DC

## 2023-12-29 MED ORDER — ACETAMINOPHEN 650 MG RE SUPP: 650.0000 mg | Freq: Four times a day (QID) | RECTAL | Status: DC | PRN

## 2023-12-29 MED ORDER — METOPROLOL TARTRATE 12.5 MG HALF TABLET: 12.5000 mg | ORAL_TABLET | Freq: Two times a day (BID) | ORAL | Status: DC

## 2023-12-29 MED ORDER — IOHEXOL 350 MG/ML SOLN
60.0000 mL | Freq: Once | INTRAVENOUS | Status: AC | PRN
  Administered 2023-12-29: 60 mL via INTRAVENOUS

## 2023-12-29 MED ORDER — METOPROLOL TARTRATE 25 MG PO TABS
25.0000 mg | ORAL_TABLET | Freq: Two times a day (BID) | ORAL | Status: DC
  Administered 2023-12-29 – 2023-12-30 (×2): 25 mg via ORAL
  Filled 2023-12-29 (×2): qty 1

## 2023-12-29 MED ORDER — ONDANSETRON HCL 4 MG/2ML IJ SOLN
4.0000 mg | Freq: Once | INTRAMUSCULAR | Status: AC
  Administered 2023-12-29: 4 mg via INTRAVENOUS

## 2023-12-29 MED ORDER — DILTIAZEM HCL 25 MG/5ML IV SOLN
20.0000 mg | Freq: Once | INTRAVENOUS | Status: AC
  Administered 2023-12-29: 20 mg via INTRAVENOUS
  Filled 2023-12-29: qty 5

## 2023-12-29 MED ORDER — DILTIAZEM LOAD VIA INFUSION: 20.0000 mg | Freq: Once | INTRAVENOUS | Status: DC

## 2023-12-29 MED ORDER — HEPARIN (PORCINE) 25000 UT/250ML-% IV SOLN
1250.0000 [IU]/h | INTRAVENOUS | Status: AC
  Administered 2023-12-29: 1000 [IU]/h via INTRAVENOUS
  Administered 2023-12-30 – 2023-12-31 (×2): 1250 [IU]/h via INTRAVENOUS
  Filled 2023-12-29 (×3): qty 250

## 2023-12-29 MED ORDER — ONDANSETRON HCL 4 MG/2ML IJ SOLN
4.0000 mg | Freq: Four times a day (QID) | INTRAMUSCULAR | Status: DC | PRN
  Administered 2023-12-30 – 2024-01-02 (×2): 4 mg via INTRAVENOUS
  Filled 2023-12-29 (×3): qty 2

## 2023-12-29 MED ORDER — ONDANSETRON HCL 4 MG PO TABS
4.0000 mg | ORAL_TABLET | Freq: Four times a day (QID) | ORAL | Status: DC | PRN
  Administered 2024-01-02: 4 mg via ORAL

## 2023-12-29 MED ORDER — SODIUM CHLORIDE 0.9 % IV SOLN
INTRAVENOUS | Status: DC
  Administered 2023-12-29 – 2023-12-30 (×2): 75 mL via INTRAVENOUS

## 2023-12-29 NOTE — Hospital Course (Addendum)
 82yof w/ history of depression, hypertension brought to the ED by EMS after found down at the bottom of 4 steps with face down in the dirt & body on concrete and was unconscious.  Unknown downtime and when EMS arrived and rolled her over she was still unconscious with agonal breathing, c-collar was applied.  On the way patient was vomiting violently, becoming more alert awake and on arrival alert awake oriented x 2. In the ED she reported not remembering following but reports feeling funny prior to the fall, no reported chest pain shortness of breath dizziness, focal weakness.  At baseline patient is ambulatory.  She had recent fall a month ago supposed to have a left ankle brace but unable to use due to brace rubbing on the skin and plan is for the cast after she gets her skin cancer removed which could not happen because her blood pressure was high.  She lives with her son.  On my exam she is alert awake and oriented x 3. In the ED: Vitals tachycardic with A-fib.  Other vital stable, on room air.  Labs showed creatinine 1.2 calcium 8.6 troponin 7, normal lactic acid, hemoglobin 10.7 INR 1.2. Underwent CT head and neck no acute finding mild chronic small vessel disease, CT chest abdomen pelvis> no acute traumatic injury, small right pleural effusion coronary artery calcification and colonic diverticulosis noted. Heparin  drip was started for A-fib since no evidence of internal bleeding, Cardizem  was ordered. 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  Did well with PT OT and eager to go home Cardiology advised okay for discharge with starting OAC on 6/8

## 2023-12-29 NOTE — Progress Notes (Signed)
 Responded to page to support pt that was found down.  Chaplain provided emotional and spiritual support.  Will follow as needed.  Anton Baton, [place], [facility], Pager [phone]

## 2023-12-29 NOTE — ED Provider Notes (Signed)
 [place] [facility]MENT AT [facility] Provider Note   CSN: [id number] Arrival date & time: 12/29/23  1324     History  Chief Complaint  Patient presents with   Level 2 Fall/LOC    Barbara Soto is a 83 y.o. female.  Patient is an 83 year old female with past medical history of depression and hypertension presenting to the emergency department after a fall.  Per EMS, the patient was at a neighbor's house babysitting when she fell down 4 steps and landed on the concrete, landed on her face.  Initially was unresponsive.  They report when fire arrived they rolled her over and she was having agonal respirations, after rolling her over and sitting her up she started breathing spontaneously on her own.  EMS reports on their arrival she was hemodynamically stable though had repetitive vomiting and route.  Was confused and route.  Patient states that she does not remember falling but states that she does remember "feeling funny" prior to the fall.  She states that she has had no chest pain or shortness of breath, lightheadedness or dizziness.  She states she had no nausea or vomiting prior to the fall.  Denies any pain at this time.  States that she is on aspirin only.  The history is provided by the patient and the EMS personnel.       Home Medications Prior to Admission medications   Not on File      Allergies    Patient has no allergy information on record.    Review of Systems   Review of Systems  Physical Exam Updated Vital Signs BP 117/79   Pulse 94   Temp 97.7 F (36.5 C) (Oral)   Resp 17   Ht 5\' 1"  (1.549 m)   Wt 96.2 kg   SpO2 97%   BMI 40.06 kg/m  Physical Exam Vitals and nursing note reviewed.  Constitutional:      General: She is not in acute distress.    Appearance: Normal appearance.  HENT:     Head: Normocephalic.     Comments: Abrasion to R eyebrow, bruising and hematoma to R anterior scalp    Nose: Nose normal.     Mouth/Throat:      Mouth: Mucous membranes are moist.     Pharynx: Oropharynx is clear.  Eyes:     Extraocular Movements: Extraocular movements intact.     Conjunctiva/sclera: Conjunctivae normal.     Pupils: Pupils are equal, round, and reactive to light.  Neck:     Comments: No midline neck tenderness, c-collar in place Cardiovascular:     Rate and Rhythm: Normal rate and regular rhythm.     Heart sounds: Normal heart sounds.  Pulmonary:     Effort: Pulmonary effort is normal.     Breath sounds: Normal breath sounds.  Abdominal:     General: Abdomen is flat.     Palpations: Abdomen is soft.     Tenderness: There is no abdominal tenderness.  Musculoskeletal:        General: Normal range of motion.     Comments: No midline back tenderness No bony tenderness of bilateral upper or lower extremities Pelvis stable, nontender  Skin:    General: Skin is warm and dry.     Comments: Abrasion to right shoulder and right knee  Neurological:     General: No focal deficit present.     Mental Status: She is alert.     Comments: Oriented  to person and place only, following commands in all 4 extremities  Psychiatric:        Mood and Affect: Mood normal.        Behavior: Behavior normal.     ED Results / Procedures / Treatments   Labs (all labs ordered are listed, but only abnormal results are displayed) Labs Reviewed  COMPREHENSIVE METABOLIC PANEL WITH GFR - Abnormal; Notable for the following components:      Result Value   Glucose, Bld 132 (*)    Creatinine, Ser 1.21 (*)    Calcium 8.6 (*)    GFR, Estimated 45 (*)    All other components within normal limits  CBC - Abnormal; Notable for the following components:   RBC 3.64 (*)    Hemoglobin 10.7 (*)    HCT 33.6 (*)    All other components within normal limits  I-STAT CHEM 8, ED - Abnormal; Notable for the following components:   Creatinine, Ser 1.30 (*)    Glucose, Bld 132 (*)    Calcium, Ion 0.96 (*)    Hemoglobin 10.9 (*)    HCT 32.0 (*)     All other components within normal limits  ETHANOL  PROTIME-INR  URINALYSIS, ROUTINE W REFLEX MICROSCOPIC  I-STAT CG4 LACTIC ACID, ED  SAMPLE TO BLOOD BANK  TROPONIN I (HIGH SENSITIVITY)  TROPONIN I (HIGH SENSITIVITY)    EKG EKG Interpretation Date/Time:  Friday Dec 29 2023 14:08:39 EDT Ventricular Rate:  100 PR Interval:    QRS Duration:  97 QT Interval:  362 QTC Calculation: 467 R Axis:   -66  Text Interpretation: Atrial fibrillation Abnormal R-wave progression, early transition Inferior infarct, old Consider anterior infarct No previous ECGs available Confirmed by Celesta Coke ([id number]) on 12/29/2023 2:20:47 PM  Radiology No results found.  Procedures Ultrasound ED FAST  Date/Time: 12/29/2023 2:05 PM  Performed by: Nolberto Batty, DO Authorized by: Nolberto Batty, DO  Procedure details:    Indications: blunt abdominal trauma and blunt chest trauma       Assess for:  Pericardial effusion and intra-abdominal fluid    Technique:  Abdominal and cardiac    Images: archived      Abdominal findings:    L kidney:  Visualized   R kidney:  Visualized   Liver:  Visualized    Bladder:  Visualized   Hepatorenal space visualized: identified     Splenorenal space: identified     Rectovesical free fluid: not identified     Splenorenal free fluid: not identified     Hepatorenal space free fluid: not identified   Cardiac findings:    Heart:  Visualized   Wall motion: identified     Pericardial effusion: not identified   Comments:     FAST negative     Medications Ordered in ED Medications  ondansetron  (ZOFRAN ) injection 4 mg (4 mg Intravenous Given 12/29/23 1332)  Tdap (BOOSTRIX ) injection 0.5 mL (0.5 mLs Intramuscular Given 12/29/23 1341)  iohexol  (OMNIPAQUE ) 350 MG/ML injection 60 mL (60 mLs Intravenous Contrast Given 12/29/23 1411)    ED Course/ Medical Decision Making/ A&P Clinical Course as of 12/29/23 1532  Fri Dec 29, 2023  1530 Does appear to  be in new a fib on EKG, labs otherwise with mild increased Cr. Imaging reads pending. Signed out to Dr. Scarlette Currier with plan for likely admission with high risk syncope. [VK]    Clinical Course User Index [VK] Kingsley, Madilynn Montante K, DO  Medical Decision Making This patient presents to the ED with chief complaint(s) of syncopal fall with pertinent past medical history of HTN, depression which further complicates the presenting complaint. The complaint involves an extensive differential diagnosis and also carries with it a high risk of complications and morbidity.    The differential diagnosis includes ICH, mass effect, cervical spine fracture, blunt thoracic or abdominal trauma, arrhythmia, anemia, dehydration, electrolyte abnormality, infection  Additional history obtained: Additional history obtained from EMS  Records reviewed N/A  ED Course and Reassessment: Due to patient's initial agonal respirations she was made a prehospital arrival level 1 trauma.  On her arrival she is awake and alert and primary survey is intact so she was downgraded to level 2.  Secondary survey was significant for confusion to date, small abrasion/superficial laceration and hematoma to her right side of her head as well as abrasion to her right shoulder and right knee.  FAST exam was performed and was negative.  The patient will have CT pan scan to evaluate for traumatic injury as well as labs and EKG to evaluate for cause of her syncopal event and she will be closely reassessed.  Independent labs interpretation:  The following labs were independently interpreted: mild increased Cr otherwise within normal range  Independent visualization of imaging: - pending   Amount and/or Complexity of Data Reviewed Labs: ordered. Radiology: ordered.  Risk Prescription drug management.          Final Clinical Impression(s) / ED Diagnoses Final diagnoses:  Syncope, unspecified  syncope type  New onset atrial fibrillation [facility])    Rx / DC Orders ED Discharge Orders     None         Kingsley, Reni Hausner K, DO 12/29/23 1532

## 2023-12-29 NOTE — Progress Notes (Signed)
 ANTICOAGULATION CONSULT NOTE  Pharmacy Consult for Heparin  Indication: atrial fibrillation  Not on File  Patient Measurements: Height: 5\' 1"  (154.9 cm) Weight: 96.2 kg (212 lb) IBW/kg (Calculated) : 47.8 Heparin  Dosing Weight: 70.7 kg  Vital Signs: Temp: 97.7 F (36.5 C) (05/30 1327) Temp Source: Oral (05/30 1327) BP: 141/82 (05/30 1645) Pulse Rate: 107 (05/30 1645)  Labs: Recent Labs    12/29/23 1337 12/29/23 1342  HGB 10.7* 10.9*  HCT 33.6* 32.0*  PLT 181  --   LABPROT 15.1  --   INR 1.2  --   CREATININE 1.21* 1.30*  TROPONINIHS 7  --     Estimated Creatinine Clearance: 36 mL/min (A) (by C-G formula based on SCr of 1.3 mg/dL (H)).   Medical History: History reviewed. No pertinent past medical history.  Medications:  (Not in a hospital admission)  Scheduled:   diltiazem   20 mg Intravenous Once   Infusions:  PRN:   Assessment: 34 yof with a history of depression, HTN. Patient is presenting after a fall. Heparin  per pharmacy consult placed for atrial fibrillation.  CT imaging w/out evidence for acute intracranial abnormality  Patient is not on anticoagulation prior to arrival.  Hgb 10.9; plt 181 PT/INR 15.1/1.2  Goal of Therapy:  Heparin  level 0.3-0.7 units/ml Monitor platelets by anticoagulation protocol: Yes   Plan:  Give IV heparin  3500 units bolus x 1 Start heparin  infusion at 1000 units/hr Check anti-Xa level in 8 hours and daily while on heparin  Continue to monitor H&H and platelets  Dionicio Fray, PharmD, BCPS 12/29/2023 4:55 PM ED Clinical Pharmacist -  [phone]

## 2023-12-29 NOTE — ED Notes (Signed)
 Trauma Response Nurse Documentation   Barbara Soto is a 83 y.o. female arriving to [facility] ED via EMS  On No antithrombotic. Trauma was activated as a Level 1 by ED Charge RN based on the following trauma criteria GCS < 9 on scene.  Downgrade to Level 2 upon arrival to ED. Patient cleared for CT by Dr. Nora Beal. Pt transported to CT with trauma response nurse present to monitor. RN remained with the patient throughout their absence from the department for clinical observation.   GCS 14.  Trauma MD Arrival Time: 27 Dr Hildy Lowers - downgrade to Level 2 due to change in pt's condition/GCS improved.  History   History reviewed. No pertinent past medical history.       Initial Focused Assessment (If applicable, or please see trauma documentation): Airway: Intact, Patent  Breathing: Breath sounds clear, equal bilaterally SpO2 100% on RA.  Circulation: Small laceration 0.5cm to R eyebrow. Contusion and hematoma to R anterior scalp. Small abrasion to chin.  Abrasion/bruising to R arm.  Abrasion to R knee. Bleeding all controlled.  Pulses intact.  18G PIV to L FA Disability: A/Ox3. Pt has minimal confusion. PERRLA 2's. EMS c-collar present.   CT's Completed:   CT Head, CT C-Spine, CT Chest w/ contrast, and CT abdomen/pelvis w/ contrast   Interventions:  18G PIV to R FA Trauma labs drawn Pt undressed and logrolled - unremarkable  Warm blankets applied CXR Pelvic XR [place] C-collar applied  CT pan scan  Plan for disposition:  Other   Consults completed:  none at 1400.  Event Summary: Pt was BIB EMS after being found unresponsive, face down at the bottom of 4 stairs with agonal breathing.  Pt landed with her face in the dirt and her body on concrete.  UNK down time or cause of the fall.  FD reported that when the rolled her over, she was still unconscious with agonal breathing.  C-collar was applied and while en route with EMS pt began violently vomiting but became more and more  alert en route to the ED.  Pt arrived alert but confused.  Baseline GCS of 15 and pt was supposedly watching a lady with dementia during this event.   Bedside handoff with ED RN Germain Kohler.    Juan Noel  Trauma Response RN  Please call TRN at [phone] for further assistance.

## 2023-12-29 NOTE — ED Notes (Signed)
Patient transported to CT with TRN.  

## 2023-12-29 NOTE — ED Provider Notes (Signed)
 Pt signed out by Dr. Nora Beal pending CT scans.    Scans reviewed by me.  I agree with the radiologist.  CT head/neck:  No evidence of acute intracranial abnormality or cervical spine  fracture.  2. Mild chronic small vessel ischemic disease.   CT chest/abd/pelvis: No evidence of acute traumatic injury to the chest, abdomen, or  pelvis.  2. Small right pleural effusion. Small pericardial effusion.  3. Coronary artery calcifications.  4. Colonic diverticulosis without diverticulitis.    Aortic Atherosclerosis (ICD10-I70.0).   As there is no evidence of internal bleeding, heparin  gtt started.  HR above 100, so cardizem  also given.  Pt d/w Dr. Bobbetta Burnet (triad) for admission.   Sueellen Emery, MD 12/29/23 [phone]

## 2023-12-29 NOTE — ED Triage Notes (Signed)
 Pt BIB [facility] as Level 1 trauma that was downgraded to 2 upon arrival. Ems reports she was found down at the bottom of 4 steps face down in the dirt & body on concrete unconscious. Unknown cause or down time, when FD arrival & rolled her over she was still unconscious with agonal breathing. C-collar applied, awoke on their truck en route & was violently vomiting & was becoming more alert the closer they became to ED. Was A/Ox2 upon arrival, baseline reported A/Ox4, A-Fib on their monitor ranging from 100-130 bpm, 18g Lt FA PIV.

## 2023-12-29 NOTE — H&P (Addendum)
 History and Physical    Barbara Soto YQM:[id number] DOB: 07-Jan-1941 DOA: 12/29/2023  PCP: No primary care provider on file.   Patient coming from: Home vsi EMS  Chief Complaint  Patient presents with   Level 2 Fall/LOC  HPI:82yof w/ history of depression, hypertension brought to the ED by EMS after found down at the bottom of 4 steps with face down in the dirt & body on concrete and was unconscious.  Unknown downtime and when EMS arrived and rolled her over she was still unconscious with agonal breathing, c-collar was applied.  On the way patient was vomiting violently, becoming more alert awake and on arrival alert awake oriented x 2. In the ED she reported not remembering following but reports "feeling funny" prior to the fall, no reported chest pain shortness of breath dizziness, focal weakness.  At baseline patient is ambulatory.  She had recent fall a month ago supposed to have a left ankle brace but unable to use due to brace rubbing on the skin and plan is for the cast after she gets her skin cancer removed which could not happen because her blood pressure was high.  She lives with her son.  On my exam she is alert awake and oriented x 3. In the ED: Vitals tachycardic with A-fib.  Other vital stable, on room air.  Labs showed creatinine 1.2 calcium 8.6 troponin 7, normal lactic acid, hemoglobin 10.7 INR 1.2. Underwent CT head and neck no acute finding mild chronic small vessel disease, CT chest abdomen pelvis> no acute traumatic injury, small right pleural effusion coronary artery calcification and colonic diverticulosis noted. Heparin  drip was started for A-fib since no evidence of internal bleeding, Cardizem  was ordered. Admission requested due to syncope and new onset A-fib with RVR   Assessment/Plan Principal Problem:   New onset atrial fibrillation (HCC)  Suspected syncope Patient found at the bottom of the steps with facedown unknown downtime New onset A-fib with RVR: Admit  the patient continue IV fluid hydration check orthostatic vitals PT OT.  Check UA.  No other infectious suspected.  Will check echocardiogram, TSH, FT4.  Heparin  has been started in the ED.  Given her syncopal episode new onset A-fib with RVR will consult cardiology-discussed with cardiology team since she is stable currently plan is for cardiology to evaluate her pending echocardiogram.  Hypertension: BP stable. Hold meds-start metoprolol .  Resume home meds slowly Patient reports blood pressure has been poorly controlled at home.  AKI versus CKD: Her last creatinine was 0.8 in 2022 in another chart, we will hydrate and repeat labs  Anemia: Normocytic likely chronic check anemia panel.  Of note hemoglobin was normal 05/12/2021 but in 2091 as low as 6.4 g  Recent left ankle injury: Followed by orthopedics supposed to have cast placed after skin cancer removed from her left leg. Will ask ortho to eval in am  Morbid obesity with BMI Body mass index is 40.06 kg/m.  She will benefit with weight loss PCP follow-up and healthy lifestyle  Severity of Illness: The appropriate patient status for this patient is OBSERVATION. Observation status is judged to be reasonable and necessary in order to provide the required intensity of service to ensure the patient's safety. The patient's presenting symptoms, physical exam findings, and initial radiographic and laboratory data in the context of their medical condition is felt to place them at decreased risk for further clinical deterioration. Furthermore, it is anticipated that the patient will be medically stable for discharge from  the hospital within 2 midnights of admission.    DVT prophylaxis: heparin  gtt Code Status:   Code Status: Limited: Do not attempt resuscitation (DNR) -DNR-LIMITED -Do Not Intubate/DNI  as per daughter patient is DNR, patient agrees. Family Communication: Admission, patients condition and plan of care including tests being ordered  have been discussed with the patient and her daughter who indicate understanding and agree with the plan and Code Status.  Consults called:  Cardiology  Review of Systems: All systems were reviewed and were negative except as mentioned in HPI above. Negative for fever Negative for chest pain Negative for shortness of breath  Past medical history: Hypertension skin cancer DJD postoperative nausea vomiting  Past surgical history: total hip arthroplasty right in 2019 following which she had acute blood loss anemia needing blood transfusion next abdominal hysterectomy, appendicectomy, cholecystectomy  Prior to Admission medications   Medication Sig Start Date End Date Taking? Authorizing Provider  amLODipine (NORVASC) 5 MG tablet Take 5 mg by mouth daily. 12/21/23   [provider]  furosemide (LASIX) 20 MG tablet Take 20 mg by mouth. 10/20/23   [provider]  HYDROcodone-acetaminophen  (NORCO/VICODIN) 5-325 MG tablet Take 1 tablet by mouth every 8 (eight) hours as needed. 12/04/23   [provider]  lisinopril (ZESTRIL) 10 MG tablet Take 10 mg by mouth daily. 11/03/23   [provider]  mupirocin ointment (BACTROBAN) 2 % Apply 1 Application topically 2 (two) times daily. 12/14/23   [provider]  sertraline (ZOLOFT) 25 MG tablet Take 25 mg by mouth daily. 12/23/23   [provider]  TOLAK 4 % CREA Apply 1 application  topically at bedtime. 11/17/23   [provider]  zolpidem (AMBIEN) 5 MG tablet Take 5 mg by mouth at bedtime as needed. 11/24/23   [provider]    Physical Exam: Vitals:   12/29/23 1630 12/29/23 1645 12/29/23 1700 12/29/23 1715  BP: 138/88 (!) 141/82 116/75 97/60  Pulse: 97 (!) 107 96 87  Resp: 18 20 (!) 25 (!) 23  Temp:      TempSrc:      SpO2: 99% 99% 94% 98%  Weight:      Height:       General exam: AAOx3 , NAD, weak appearing. HEENT:Oral mucosa moist, Ear/Nose WNL grossly, dentition  normal. Respiratory system: bilaterally clear no wheezing or crackles,no use of accessory muscle, irregular Cardiovascular system: S1 & S2 +, No JVD,. Gastrointestinal system: Abdomen soft, NT,ND, BS+ Nervous System:Alert, awake, moving extremities and grossly nonfocal Extremities: No edema, distal peripheral pulses palpable.  Skin: No rashes,no icterus. MSK: Normal muscle bulk,tone, power, left ankle with mild tenderness and swelling  Labs on Admission: I have personally reviewed following labs and imaging studies  CBC: Recent Labs  Lab 12/29/23 1337 12/29/23 1342  WBC 5.4  --   HGB 10.7* 10.9*  HCT 33.6* 32.0*  MCV 92.3  --   PLT 181  --    Basic Metabolic Panel: Recent Labs  Lab 12/29/23 1337 12/29/23 1342  NA 139 139  K 3.9 3.7  CL 102 102  CO2 24  --   GLUCOSE 132* 132*  BUN 16 18  CREATININE 1.21* 1.30*  CALCIUM 8.6*  --    Estimated Creatinine Clearance: 36 mL/min (A) (by C-G formula based on SCr of 1.3 mg/dL (H)). Recent Labs  Lab 12/29/23 1337  AST 30  ALT 14  ALKPHOS 85  BILITOT 0.9  PROT 6.6  ALBUMIN 3.7  Coagulation  Profile: Recent Labs  Lab 12/29/23 1337  INR 1.2   Cardiac Panel (last 3 results) Recent Labs    12/29/23 1337 12/29/23 1634  TROPONINIHS 7 9    Radiological Exams on Admission: CT CHEST ABDOMEN PELVIS W CONTRAST Result Date: 12/29/2023 CLINICAL DATA:  Blunt trauma, status post fall. Wound bottom of steps. EXAM: CT CHEST, ABDOMEN, AND PELVIS WITH CONTRAST TECHNIQUE: Multidetector CT imaging of the chest, abdomen and pelvis was performed following the standard protocol during bolus administration of intravenous contrast. RADIATION DOSE REDUCTION: This exam was performed according to the departmental dose-optimization program which includes automated exposure control, adjustment of the mA and/or kV according to patient size and/or use of iterative reconstruction technique. CONTRAST:  60mL OMNIPAQUE  IOHEXOL  350 MG/ML SOLN COMPARISON:   None Available. FINDINGS: CT CHEST FINDINGS Cardiovascular: No evidence of acute aortic or traumatic vascular injury. The heart is normal in size. There is a small pericardial effusion. Coronary artery calcifications. Mild aortic atherosclerosis. Mediastinum/Nodes: No mediastinal hemorrhage or hematoma. No mediastinal or hilar adenopathy. No pneumomediastinum. Decompressed esophagus. Lungs/Pleura: No pneumothorax. No pulmonary contusion. Small right pleural effusion appears to be simple fluid density. Hypoventilatory changes in the dependent lungs. Mild subpleural reticulation. Trachea and central airways are clear. Musculoskeletal: No acute fracture of the ribs, sternum, included clavicles or shoulder girdles. Degenerative change throughout the thoracic spine and both shoulders. No confluent chest wall contusion. CT ABDOMEN PELVIS FINDINGS Hepatobiliary: No hepatic injury or perihepatic hematoma. No focal liver abnormality. Cholecystectomy without biliary dilatation. Pancreas: No evidence of injury. No ductal dilatation or inflammation. Spleen: No splenic injury or perisplenic hematoma. Adrenals/Urinary Tract: No adrenal nodule or hemorrhage. No evidence of renal injury. No hydronephrosis. Homogeneous renal enhancement. Urinary bladder is decompressed. No evidence of bladder injury. Stomach/Bowel: No evidence of bowel injury or mesenteric hematoma. Stomach is decompressed. Small duodenal diverticulum. No bowel wall thickening or inflammation. Small volume of stool throughout the colon. Colonic diverticulosis without diverticulitis. The appendix is not confidently seen. Vascular/Lymphatic: Aortic atherosclerosis. No aortic aneurysm. No vascular injury or retroperitoneal fluid. No bulky adenopathy. Reproductive: Status post hysterectomy. No adnexal masses. Other: No free air or free fluid.  No confluent body wall contusion. Musculoskeletal: Right hip arthroplasty. No periprosthetic lucency. No acute fracture of the  pelvis or lumbar spine. Degenerative change of the lumbar spine and left hip. IMPRESSION: 1. No evidence of acute traumatic injury to the chest, abdomen, or pelvis. 2. Small right pleural effusion. Small pericardial effusion. 3. Coronary artery calcifications. 4. Colonic diverticulosis without diverticulitis. Aortic Atherosclerosis (ICD10-I70.0). Electronically Signed   By: Chadwick Colonel M.D.   On: 12/29/2023 16:36   DG Pelvis Portable Result Date: 12/29/2023 CLINICAL DATA:  Trauma, fall. EXAM: PORTABLE PELVIS 1-2 VIEWS COMPARISON:  None Available. FINDINGS: Technically limited by soft tissue attenuation from habitus. No evidence of pelvic fracture. Right hip arthroplasty is only partially included in the field of view, intact were visualized. Left hip osteoarthritis without hip dislocation. Pubic rami are grossly intact. No pubic symphyseal or sacroiliac diastasis. IMPRESSION: 1. No pelvic fracture. 2. Right hip arthroplasty. 3. Left hip osteoarthritis. Electronically Signed   By: Chadwick Colonel M.D.   On: 12/29/2023 16:29   DG Chest Port 1 View Result Date: 12/29/2023 CLINICAL DATA:  Trauma, fall. EXAM: PORTABLE CHEST 1 VIEW COMPARISON:  Radiograph 08/24/2020 FINDINGS: Low lung volumes limit assessment. Prominent heart size likely accentuated by technique. Artifact projects over the right lung base. No evidence of pneumothorax, pleural effusion or focal airspace disease. No grossly  displaced fracture on limited osseous assessment. IMPRESSION: Hypoventilatory chest without evidence of acute traumatic injury. Electronically Signed   By: Chadwick Colonel M.D.   On: 12/29/2023 16:29   CT HEAD WO CONTRAST Result Date: 12/29/2023 CLINICAL DATA:  Head trauma, moderate-severe; Polytrauma, blunt. Level 2 trauma. Fall. Found at the bottom of steps unconscious. EXAM: CT HEAD WITHOUT CONTRAST CT CERVICAL SPINE WITHOUT CONTRAST TECHNIQUE: Multidetector CT imaging of the head and cervical spine was performed  following the standard protocol without intravenous contrast. Multiplanar CT image reconstructions of the cervical spine were also generated. RADIATION DOSE REDUCTION: This exam was performed according to the departmental dose-optimization program which includes automated exposure control, adjustment of the mA and/or kV according to patient size and/or use of iterative reconstruction technique. COMPARISON:  CT head and cervical spine 06/30/2008 FINDINGS: CT HEAD FINDINGS Brain: There is no evidence of an acute infarct, intracranial hemorrhage, mass, midline shift, or extra-axial fluid collection. Mild cerebral atrophy is within normal limits for age. Cerebral white matter hypodensities are nonspecific but compatible with mild chronic small vessel ischemic disease. Vascular: Calcified atherosclerosis at the skull base. No hyperdense vessel. Skull: No acute fracture or suspicious lesion. Sinuses/Orbits: Chronic sinusitis including subtotal opacification of the right sphenoid sinus. Small chronic left mastoid effusion. Bilateral cataract extraction. Other: None. CT CERVICAL SPINE FINDINGS Alignment: No significant subluxation. Skull base and vertebrae: No acute fracture or suspicious lesion. Soft tissues and spinal canal: No prevertebral fluid or swelling. No visible canal hematoma. Disc levels: Mild spondylosis. Advanced multilevel cervical facet arthrosis with facet ankylosis on the left at C2-3 and right at C3-4. Severe neural foraminal stenosis on the right at C3-4 and on the left at C4-5. No evidence of high-grade spinal canal stenosis. Upper chest: Evaluated on the separately reported contemporaneous CT of the chest, abdomen, and pelvis. Other: None. IMPRESSION: 1. No evidence of acute intracranial abnormality or cervical spine fracture. 2. Mild chronic small vessel ischemic disease. Electronically Signed   By: Aundra Lee M.D.   On: 12/29/2023 16:13   CT CERVICAL SPINE WO CONTRAST Result Date:  12/29/2023 CLINICAL DATA:  Head trauma, moderate-severe; Polytrauma, blunt. Level 2 trauma. Fall. Found at the bottom of steps unconscious. EXAM: CT HEAD WITHOUT CONTRAST CT CERVICAL SPINE WITHOUT CONTRAST TECHNIQUE: Multidetector CT imaging of the head and cervical spine was performed following the standard protocol without intravenous contrast. Multiplanar CT image reconstructions of the cervical spine were also generated. RADIATION DOSE REDUCTION: This exam was performed according to the departmental dose-optimization program which includes automated exposure control, adjustment of the mA and/or kV according to patient size and/or use of iterative reconstruction technique. COMPARISON:  CT head and cervical spine 06/30/2008 FINDINGS: CT HEAD FINDINGS Brain: There is no evidence of an acute infarct, intracranial hemorrhage, mass, midline shift, or extra-axial fluid collection. Mild cerebral atrophy is within normal limits for age. Cerebral white matter hypodensities are nonspecific but compatible with mild chronic small vessel ischemic disease. Vascular: Calcified atherosclerosis at the skull base. No hyperdense vessel. Skull: No acute fracture or suspicious lesion. Sinuses/Orbits: Chronic sinusitis including subtotal opacification of the right sphenoid sinus. Small chronic left mastoid effusion. Bilateral cataract extraction. Other: None. CT CERVICAL SPINE FINDINGS Alignment: No significant subluxation. Skull base and vertebrae: No acute fracture or suspicious lesion. Soft tissues and spinal canal: No prevertebral fluid or swelling. No visible canal hematoma. Disc levels: Mild spondylosis. Advanced multilevel cervical facet arthrosis with facet ankylosis on the left at C2-3 and right at C3-4. Severe neural  foraminal stenosis on the right at C3-4 and on the left at C4-5. No evidence of high-grade spinal canal stenosis. Upper chest: Evaluated on the separately reported contemporaneous CT of the chest, abdomen, and  pelvis. Other: None. IMPRESSION: 1. No evidence of acute intracranial abnormality or cervical spine fracture. 2. Mild chronic small vessel ischemic disease. Electronically Signed   By: Aundra Lee M.D.   On: 12/29/2023 16:13   Barbara Rape MD [facility]  If 7PM-7AM, please contact night-coverage [email or web address]  12/29/2023, 5:50 PM

## 2023-12-29 NOTE — ED Notes (Signed)
 X-ray at bedside

## 2023-12-29 NOTE — Consult Note (Signed)
 Cardiology Consultation   Patient ID: Barbara Soto MRN: [id number]; DOB: January 12, 1941  Admit date: 12/29/2023 Date of Consult: 12/29/2023  PCP:  No primary care provider on file.   [facility] Providers Cardiologist:  None        Patient Profile: Barbara Soto is a 83 y.o. female with a hx of depression, hypertension brought in by EMS to the ED after being found down at the bottom of 4 steps facedown in the dirt when EMS arrived and rolled her over she was unconscious with agonal breathing, on upon presentation to the ED was noted to be in atrial fibrillation. being seen 12/29/2023 for the evaluation of syncope and atrial fibrillation at the request of Dr. Bobbetta Burnet.  History of Present Illness: Barbara Soto is an 83 year old female with new onset atrial fibrillation with rapid ventricular response.  Unclear what precipitated her fall, trauma screening overall unremarkable, head CT noted mild chronic small vessel disease, CT chest abdomen pelvis with no acute traumatic injury though she does have a right small pleural effusion and coronary artery calcifications are noted.  Noted to be in atrial fibrillation with rapid ventricular response, given negative trauma screening she was started on IV heparin .  Echocardiogram is ordered and will be completed tomorrow.  Rates are overall reasonably well-controlled.  Pertinent labs include normal sodium and potassium, creatinine 1.3 hemoglobin 10.9 hematocrit 32 platelet count 181 moderate leukocytes on urinalysis.   History reviewed. No pertinent past medical history.     Home Medications:  Prior to Admission medications   Medication Sig Start Date End Date Taking? Authorizing Provider  aspirin EC 81 MG tablet Take 81 mg by mouth daily. Swallow whole.   Yes [provider]  amLODipine (NORVASC) 5 MG tablet Take 5 mg by mouth daily. 12/21/23  Yes [provider]  furosemide (LASIX) 20 MG tablet Take 20 mg by mouth. 10/20/23   Yes [provider]  HYDROcodone-acetaminophen  (NORCO/VICODIN) 5-325 MG tablet Take 1 tablet by mouth every 8 (eight) hours as needed. Patient not taking: Reported on 12/29/2023 12/04/23   [provider]  lisinopril (ZESTRIL) 10 MG tablet Take 10 mg by mouth daily. 11/03/23  Yes [provider]  mupirocin ointment (BACTROBAN) 2 % Apply 1 Application topically 2 (two) times daily. Patient not taking: Reported on 12/29/2023 12/14/23   [provider]  sertraline (ZOLOFT) 25 MG tablet Take 25 mg by mouth daily. 12/23/23  Yes [provider]  TOLAK 4 % CREA Apply 1 application  topically at bedtime. Patient not taking: Reported on 12/29/2023 11/17/23   [provider]  zolpidem (AMBIEN) 5 MG tablet Take 5 mg by mouth at bedtime as needed. Patient not taking: Reported on 12/29/2023 11/24/23   [provider]    Scheduled Meds:  metoprolol  tartrate  25 mg Oral BID   Continuous Infusions:  sodium chloride      heparin  1,000 Units/hr (12/29/23 1715)   PRN Meds: acetaminophen  **OR** acetaminophen , ondansetron  **OR** ondansetron  (ZOFRAN ) IV  Allergies:    Allergies  Allergen Reactions   Latex Rash   Penicillins Other (See Comments)    -Pt stated that this medication "does her no good"    Social History:   Social History   Socioeconomic History   Marital status: Widowed    Spouse name: Not on file   Number of children: Not on file   Years of education: Not on file   Highest education level: Not on file  Occupational History  Not on file  Tobacco Use   Smoking status: Not on file   Smokeless tobacco: Not on file  Substance and Sexual Activity   Alcohol use: Not on file   Drug use: Not on file   Sexual activity: Not on file  Other Topics Concern   Not on file  Social History Narrative   Not on file   Social Drivers of Health   Financial Resource Strain: Not on file  Food Insecurity: Not on file  Transportation Needs: Not  on file  Physical Activity: Not on file  Stress: Not on file  Social Connections: Not on file  Intimate Partner Violence: Not on file    Family History:   History reviewed. No pertinent family history.   ROS:  Please see the history of present illness.   All other ROS reviewed and negative.     Physical Exam/Data: Vitals:   12/29/23 1645 12/29/23 1700 12/29/23 1715 12/29/23 1825  BP: (!) 141/82 116/75 97/60 (!) 147/72  Pulse: (!) 107 96 87 86  Resp: 20 (!) 25 (!) 23 20  Temp:    97.6 F (36.4 C)  TempSrc:    Oral  SpO2: 99% 94% 98% 97%  Weight:      Height:       No intake or output data in the 24 hours ending 12/29/23 1837    12/29/2023    1:48 PM  Last 3 Weights  Weight (lbs) 212 lb  Weight (kg) 96.163 kg     Body mass index is 40.06 kg/m.  General:  Well nourished, well developed, in no acute distress HEENT: normal Neck: no JVD Vascular: No carotid bruits; Distal pulses 2+ bilaterally Cardiac:  normal S1, S2; iRRR; no murmur  Lungs:  clear to auscultation bilaterally, no wheezing, rhonchi or rales  Abd: soft, nontender, no hepatomegaly  Ext: no edema Musculoskeletal:  No deformities, BUE and BLE strength normal and equal Skin: warm and dry  Neuro:  CNs 2-12 intact, no focal abnormalities noted Psych:  Normal affect   EKG:  The EKG was personally reviewed and demonstrates:  afib rvr rate 100, inferior infarct Telemetry:  Telemetry was personally reviewed and demonstrates:  Afib rates 90-100  Relevant CV Studies: Echo pending  Laboratory Data: High Sensitivity Troponin:   Recent Labs  Lab 12/29/23 1337 12/29/23 1634  TROPONINIHS 7 9     Chemistry Recent Labs  Lab 12/29/23 1337 12/29/23 1342  NA 139 139  K 3.9 3.7  CL 102 102  CO2 24  --   GLUCOSE 132* 132*  BUN 16 18  CREATININE 1.21* 1.30*  CALCIUM 8.6*  --   GFRNONAA 45*  --   ANIONGAP 13  --     Recent Labs  Lab 12/29/23 1337  PROT 6.6  ALBUMIN 3.7  AST 30  ALT 14  ALKPHOS 85   BILITOT 0.9   Lipids No results for input(s): "CHOL", "TRIG", "HDL", "LABVLDL", "LDLCALC", "CHOLHDL" in the last 168 hours.  Hematology Recent Labs  Lab 12/29/23 1337 12/29/23 1342  WBC 5.4  --   RBC 3.64*  --   HGB 10.7* 10.9*  HCT 33.6* 32.0*  MCV 92.3  --   MCH 29.4  --   MCHC 31.8  --   RDW 14.6  --   PLT 181  --    Thyroid  Recent Labs  Lab 12/29/23 1634  TSH 0.656    BNP Recent Labs  Lab 12/29/23 1337  BNP 182.3*  DDimer No results for input(s): "DDIMER" in the last 168 hours.  Radiology/Studies:  CT CHEST ABDOMEN PELVIS W CONTRAST Result Date: 12/29/2023 CLINICAL DATA:  Blunt trauma, status post fall. Wound bottom of steps. EXAM: CT CHEST, ABDOMEN, AND PELVIS WITH CONTRAST TECHNIQUE: Multidetector CT imaging of the chest, abdomen and pelvis was performed following the standard protocol during bolus administration of intravenous contrast. RADIATION DOSE REDUCTION: This exam was performed according to the departmental dose-optimization program which includes automated exposure control, adjustment of the mA and/or kV according to patient size and/or use of iterative reconstruction technique. CONTRAST:  60mL OMNIPAQUE  IOHEXOL  350 MG/ML SOLN COMPARISON:  None Available. FINDINGS: CT CHEST FINDINGS Cardiovascular: No evidence of acute aortic or traumatic vascular injury. The heart is normal in size. There is a small pericardial effusion. Coronary artery calcifications. Mild aortic atherosclerosis. Mediastinum/Nodes: No mediastinal hemorrhage or hematoma. No mediastinal or hilar adenopathy. No pneumomediastinum. Decompressed esophagus. Lungs/Pleura: No pneumothorax. No pulmonary contusion. Small right pleural effusion appears to be simple fluid density. Hypoventilatory changes in the dependent lungs. Mild subpleural reticulation. Trachea and central airways are clear. Musculoskeletal: No acute fracture of the ribs, sternum, included clavicles or shoulder girdles. Degenerative  change throughout the thoracic spine and both shoulders. No confluent chest wall contusion. CT ABDOMEN PELVIS FINDINGS Hepatobiliary: No hepatic injury or perihepatic hematoma. No focal liver abnormality. Cholecystectomy without biliary dilatation. Pancreas: No evidence of injury. No ductal dilatation or inflammation. Spleen: No splenic injury or perisplenic hematoma. Adrenals/Urinary Tract: No adrenal nodule or hemorrhage. No evidence of renal injury. No hydronephrosis. Homogeneous renal enhancement. Urinary bladder is decompressed. No evidence of bladder injury. Stomach/Bowel: No evidence of bowel injury or mesenteric hematoma. Stomach is decompressed. Small duodenal diverticulum. No bowel wall thickening or inflammation. Small volume of stool throughout the colon. Colonic diverticulosis without diverticulitis. The appendix is not confidently seen. Vascular/Lymphatic: Aortic atherosclerosis. No aortic aneurysm. No vascular injury or retroperitoneal fluid. No bulky adenopathy. Reproductive: Status post hysterectomy. No adnexal masses. Other: No free air or free fluid.  No confluent body wall contusion. Musculoskeletal: Right hip arthroplasty. No periprosthetic lucency. No acute fracture of the pelvis or lumbar spine. Degenerative change of the lumbar spine and left hip. IMPRESSION: 1. No evidence of acute traumatic injury to the chest, abdomen, or pelvis. 2. Small right pleural effusion. Small pericardial effusion. 3. Coronary artery calcifications. 4. Colonic diverticulosis without diverticulitis. Aortic Atherosclerosis (ICD10-I70.0). Electronically Signed   By: Chadwick Colonel M.D.   On: 12/29/2023 16:36   DG Pelvis Portable Result Date: 12/29/2023 CLINICAL DATA:  Trauma, fall. EXAM: PORTABLE PELVIS 1-2 VIEWS COMPARISON:  None Available. FINDINGS: Technically limited by soft tissue attenuation from habitus. No evidence of pelvic fracture. Right hip arthroplasty is only partially included in the field of view,  intact were visualized. Left hip osteoarthritis without hip dislocation. Pubic rami are grossly intact. No pubic symphyseal or sacroiliac diastasis. IMPRESSION: 1. No pelvic fracture. 2. Right hip arthroplasty. 3. Left hip osteoarthritis. Electronically Signed   By: Chadwick Colonel M.D.   On: 12/29/2023 16:29   DG Chest Port 1 View Result Date: 12/29/2023 CLINICAL DATA:  Trauma, fall. EXAM: PORTABLE CHEST 1 VIEW COMPARISON:  Radiograph 08/24/2020 FINDINGS: Low lung volumes limit assessment. Prominent heart size likely accentuated by technique. Artifact projects over the right lung base. No evidence of pneumothorax, pleural effusion or focal airspace disease. No grossly displaced fracture on limited osseous assessment. IMPRESSION: Hypoventilatory chest without evidence of acute traumatic injury. Electronically Signed   By: Chadwick Colonel  M.D.   On: 12/29/2023 16:29   CT HEAD WO CONTRAST Result Date: 12/29/2023 CLINICAL DATA:  Head trauma, moderate-severe; Polytrauma, blunt. Level 2 trauma. Fall. Found at the bottom of steps unconscious. EXAM: CT HEAD WITHOUT CONTRAST CT CERVICAL SPINE WITHOUT CONTRAST TECHNIQUE: Multidetector CT imaging of the head and cervical spine was performed following the standard protocol without intravenous contrast. Multiplanar CT image reconstructions of the cervical spine were also generated. RADIATION DOSE REDUCTION: This exam was performed according to the departmental dose-optimization program which includes automated exposure control, adjustment of the mA and/or kV according to patient size and/or use of iterative reconstruction technique. COMPARISON:  CT head and cervical spine 06/30/2008 FINDINGS: CT HEAD FINDINGS Brain: There is no evidence of an acute infarct, intracranial hemorrhage, mass, midline shift, or extra-axial fluid collection. Mild cerebral atrophy is within normal limits for age. Cerebral white matter hypodensities are nonspecific but compatible with mild  chronic small vessel ischemic disease. Vascular: Calcified atherosclerosis at the skull base. No hyperdense vessel. Skull: No acute fracture or suspicious lesion. Sinuses/Orbits: Chronic sinusitis including subtotal opacification of the right sphenoid sinus. Small chronic left mastoid effusion. Bilateral cataract extraction. Other: None. CT CERVICAL SPINE FINDINGS Alignment: No significant subluxation. Skull base and vertebrae: No acute fracture or suspicious lesion. Soft tissues and spinal canal: No prevertebral fluid or swelling. No visible canal hematoma. Disc levels: Mild spondylosis. Advanced multilevel cervical facet arthrosis with facet ankylosis on the left at C2-3 and right at C3-4. Severe neural foraminal stenosis on the right at C3-4 and on the left at C4-5. No evidence of high-grade spinal canal stenosis. Upper chest: Evaluated on the separately reported contemporaneous CT of the chest, abdomen, and pelvis. Other: None. IMPRESSION: 1. No evidence of acute intracranial abnormality or cervical spine fracture. 2. Mild chronic small vessel ischemic disease. Electronically Signed   By: Aundra Lee M.D.   On: 12/29/2023 16:13   CT CERVICAL SPINE WO CONTRAST Result Date: 12/29/2023 CLINICAL DATA:  Head trauma, moderate-severe; Polytrauma, blunt. Level 2 trauma. Fall. Found at the bottom of steps unconscious. EXAM: CT HEAD WITHOUT CONTRAST CT CERVICAL SPINE WITHOUT CONTRAST TECHNIQUE: Multidetector CT imaging of the head and cervical spine was performed following the standard protocol without intravenous contrast. Multiplanar CT image reconstructions of the cervical spine were also generated. RADIATION DOSE REDUCTION: This exam was performed according to the departmental dose-optimization program which includes automated exposure control, adjustment of the mA and/or kV according to patient size and/or use of iterative reconstruction technique. COMPARISON:  CT head and cervical spine 06/30/2008 FINDINGS: CT  HEAD FINDINGS Brain: There is no evidence of an acute infarct, intracranial hemorrhage, mass, midline shift, or extra-axial fluid collection. Mild cerebral atrophy is within normal limits for age. Cerebral white matter hypodensities are nonspecific but compatible with mild chronic small vessel ischemic disease. Vascular: Calcified atherosclerosis at the skull base. No hyperdense vessel. Skull: No acute fracture or suspicious lesion. Sinuses/Orbits: Chronic sinusitis including subtotal opacification of the right sphenoid sinus. Small chronic left mastoid effusion. Bilateral cataract extraction. Other: None. CT CERVICAL SPINE FINDINGS Alignment: No significant subluxation. Skull base and vertebrae: No acute fracture or suspicious lesion. Soft tissues and spinal canal: No prevertebral fluid or swelling. No visible canal hematoma. Disc levels: Mild spondylosis. Advanced multilevel cervical facet arthrosis with facet ankylosis on the left at C2-3 and right at C3-4. Severe neural foraminal stenosis on the right at C3-4 and on the left at C4-5. No evidence of high-grade spinal canal stenosis. Upper chest: Evaluated  on the separately reported contemporaneous CT of the chest, abdomen, and pelvis. Other: None. IMPRESSION: 1. No evidence of acute intracranial abnormality or cervical spine fracture. 2. Mild chronic small vessel ischemic disease. Electronically Signed   By: Aundra Lee M.D.   On: 12/29/2023 16:13     Assessment and Plan: #Afib rvr, new afib #secondary hypercoagulable state #syncope - on IV heparin , reasonable to continue. Could transition to DOAC if patient will take. - will start metoprolol  tart 25 mg BID, as rates are not significantly elevated right now.  - obtain echocardiogram to risk stratify. No chest pain, no significant SOB - if rates are well controlled, patient may be able to consider outpatient DCCV after 4 weeks. She is eager to leave.  - needs outpatient monitor due to possible syncope  vs prodrome/fall.  #HTN - will start metoprolol  tart 25 mg BID  - ok to continue home meds of amlodipine 5 mg daily - ok to hold lisinopril at the moment given AKI - resume lasix tomorrow pending renal function.    Risk Assessment/Risk Scores:         CHA2DS2-VASc Score = 4   This indicates a 4.8% annual risk of stroke. The patient's score is based upon: CHF History: 0 HTN History: 1 Diabetes History: 0 Stroke History: 0 Vascular Disease History: 0 Age Score: 2 Gender Score: 1        For questions or updates, please contact [facility] Please consult [email or web address] for contact info under    Signed, Euell Herrlich, MD  12/29/2023 6:37 PM

## 2023-12-30 ENCOUNTER — Observation Stay (HOSPITAL_COMMUNITY)

## 2023-12-30 ENCOUNTER — Other Ambulatory Visit (HOSPITAL_COMMUNITY): Payer: Self-pay

## 2023-12-30 ENCOUNTER — Observation Stay (HOSPITAL_BASED_OUTPATIENT_CLINIC_OR_DEPARTMENT_OTHER)

## 2023-12-30 DIAGNOSIS — I469 Cardiac arrest, cause unspecified: Secondary | ICD-10-CM | POA: Diagnosis not present

## 2023-12-30 DIAGNOSIS — R55 Syncope and collapse: Secondary | ICD-10-CM | POA: Diagnosis not present

## 2023-12-30 DIAGNOSIS — I4891 Unspecified atrial fibrillation: Secondary | ICD-10-CM

## 2023-12-30 DIAGNOSIS — R0603 Acute respiratory distress: Secondary | ICD-10-CM | POA: Diagnosis not present

## 2023-12-30 DIAGNOSIS — F32A Depression, unspecified: Secondary | ICD-10-CM | POA: Diagnosis not present

## 2023-12-30 DIAGNOSIS — I130 Hypertensive heart and chronic kidney disease with heart failure and stage 1 through stage 4 chronic kidney disease, or unspecified chronic kidney disease: Secondary | ICD-10-CM | POA: Diagnosis not present

## 2023-12-30 DIAGNOSIS — R9389 Abnormal findings on diagnostic imaging of other specified body structures: Secondary | ICD-10-CM | POA: Diagnosis not present

## 2023-12-30 DIAGNOSIS — Z66 Do not resuscitate: Secondary | ICD-10-CM | POA: Diagnosis not present

## 2023-12-30 DIAGNOSIS — M5126 Other intervertebral disc displacement, lumbar region: Secondary | ICD-10-CM | POA: Diagnosis not present

## 2023-12-30 DIAGNOSIS — I3139 Other pericardial effusion (noninflammatory): Secondary | ICD-10-CM | POA: Diagnosis not present

## 2023-12-30 DIAGNOSIS — M7732 Calcaneal spur, left foot: Secondary | ICD-10-CM | POA: Diagnosis not present

## 2023-12-30 DIAGNOSIS — N1831 Chronic kidney disease, stage 3a: Secondary | ICD-10-CM | POA: Diagnosis not present

## 2023-12-30 DIAGNOSIS — I517 Cardiomegaly: Secondary | ICD-10-CM | POA: Diagnosis not present

## 2023-12-30 DIAGNOSIS — E1122 Type 2 diabetes mellitus with diabetic chronic kidney disease: Secondary | ICD-10-CM | POA: Diagnosis not present

## 2023-12-30 DIAGNOSIS — R0989 Other specified symptoms and signs involving the circulatory and respiratory systems: Secondary | ICD-10-CM | POA: Diagnosis not present

## 2023-12-30 DIAGNOSIS — S3992XA Unspecified injury of lower back, initial encounter: Secondary | ICD-10-CM | POA: Diagnosis not present

## 2023-12-30 DIAGNOSIS — D6869 Other thrombophilia: Secondary | ICD-10-CM | POA: Diagnosis not present

## 2023-12-30 DIAGNOSIS — Z23 Encounter for immunization: Secondary | ICD-10-CM | POA: Diagnosis not present

## 2023-12-30 DIAGNOSIS — D631 Anemia in chronic kidney disease: Secondary | ICD-10-CM | POA: Diagnosis not present

## 2023-12-30 DIAGNOSIS — M47816 Spondylosis without myelopathy or radiculopathy, lumbar region: Secondary | ICD-10-CM | POA: Diagnosis not present

## 2023-12-30 DIAGNOSIS — M549 Dorsalgia, unspecified: Secondary | ICD-10-CM | POA: Diagnosis not present

## 2023-12-30 LAB — COMPREHENSIVE METABOLIC PANEL WITH GFR
ALT: 15 U/L (ref 0–44)
AST: 30 U/L (ref 15–41)
Albumin: 3.4 g/dL — ABNORMAL LOW (ref 3.5–5.0)
Alkaline Phosphatase: 75 U/L (ref 38–126)
Anion gap: 9 (ref 5–15)
BUN: 13 mg/dL (ref 8–23)
CO2: 27 mmol/L (ref 22–32)
Calcium: 8.5 mg/dL — ABNORMAL LOW (ref 8.9–10.3)
Chloride: 103 mmol/L (ref 98–111)
Creatinine, Ser: 1.21 mg/dL — ABNORMAL HIGH (ref 0.44–1.00)
GFR, Estimated: 45 mL/min — ABNORMAL LOW (ref >60)
Glucose, Bld: 135 mg/dL — ABNORMAL HIGH (ref 70–99)
Potassium: 4.2 mmol/L (ref 3.5–5.1)
Sodium: 139 mmol/L (ref 135–145)
Total Bilirubin: 1.4 mg/dL — ABNORMAL HIGH (ref 0.0–1.2)
Total Protein: 6.5 g/dL (ref 6.5–8.1)

## 2023-12-30 LAB — CBC
HCT: 30.6 % — ABNORMAL LOW (ref 36.0–46.0)
Hemoglobin: 9.7 g/dL — ABNORMAL LOW (ref 12.0–15.0)
MCH: 29 pg (ref 26.0–34.0)
MCHC: 31.7 g/dL (ref 30.0–36.0)
MCV: 91.6 fL (ref 80.0–100.0)
Platelets: 174 10*3/uL (ref 150–400)
RBC: 3.34 MIL/uL — ABNORMAL LOW (ref 3.87–5.11)
RDW: 14.5 % (ref 11.5–15.5)
WBC: 6 10*3/uL (ref 4.0–10.5)
nRBC: 0 % (ref 0.0–0.2)

## 2023-12-30 LAB — TROPONIN I (HIGH SENSITIVITY): Troponin I (High Sensitivity): 11 ng/L (ref <18)

## 2023-12-30 LAB — RETICULOCYTES
Immature Retic Fract: 5 % (ref 2.3–15.9)
RBC.: 3.35 MIL/uL — ABNORMAL LOW (ref 3.87–5.11)
Retic Count, Absolute: 35.2 10*3/uL (ref 19.0–186.0)
Retic Ct Pct: 1.1 % (ref 0.4–3.1)

## 2023-12-30 LAB — ECHOCARDIOGRAM COMPLETE
AR max vel: 1.79 cm2
AV Area VTI: 2.09 cm2
AV Area mean vel: 2.06 cm2
AV Mean grad: 2.9 mmHg
AV Peak grad: 7.3 mmHg
Ao pk vel: 1.35 m/s
Height: 61 in
S' Lateral: 2.9 cm
Weight: 3507.2 [oz_av]

## 2023-12-30 LAB — BLOOD GAS, VENOUS
Acid-Base Excess: 6.5 mmol/L — ABNORMAL HIGH (ref 0.0–2.0)
Bicarbonate: 33.9 mmol/L — ABNORMAL HIGH (ref 20.0–28.0)
Drawn by: 8029
O2 Saturation: 28.8 %
Patient temperature: 36.6
pCO2, Ven: 59 mmHg (ref 44–60)
pH, Ven: 7.37 (ref 7.25–7.43)
pO2, Ven: <31 mmHg — CL (ref 32–45)

## 2023-12-30 LAB — FERRITIN: Ferritin: 26 ng/mL (ref 11–307)

## 2023-12-30 LAB — OCCULT BLOOD X 1 CARD TO LAB, STOOL: Fecal Occult Bld: NEGATIVE

## 2023-12-30 LAB — BASIC METABOLIC PANEL WITH GFR
Anion gap: 11 (ref 5–15)
BUN: 14 mg/dL (ref 8–23)
CO2: 26 mmol/L (ref 22–32)
Calcium: 8.5 mg/dL — ABNORMAL LOW (ref 8.9–10.3)
Chloride: 103 mmol/L (ref 98–111)
Creatinine, Ser: 1.08 mg/dL — ABNORMAL HIGH (ref 0.44–1.00)
GFR, Estimated: 52 mL/min — ABNORMAL LOW (ref >60)
Glucose, Bld: 103 mg/dL — ABNORMAL HIGH (ref 70–99)
Potassium: 4 mmol/L (ref 3.5–5.1)
Sodium: 140 mmol/L (ref 135–145)

## 2023-12-30 LAB — MAGNESIUM: Magnesium: 1.9 mg/dL (ref 1.7–2.4)

## 2023-12-30 LAB — HEPATITIS B CORE ANTIBODY, TOTAL: HEP B CORE AB: NEGATIVE

## 2023-12-30 LAB — GLUCOSE, CAPILLARY: Glucose-Capillary: 129 mg/dL — ABNORMAL HIGH (ref 70–99)

## 2023-12-30 LAB — LACTIC ACID, PLASMA: Lactic Acid, Venous: 1.4 mmol/L (ref 0.5–1.9)

## 2023-12-30 LAB — IRON AND TIBC
Iron: 60 ug/dL (ref 28–170)
Saturation Ratios: 15 % (ref 10.4–31.8)
TIBC: 403 ug/dL (ref 250–450)
UIBC: 343 ug/dL

## 2023-12-30 LAB — FOLATE: Folate: 15.5 ng/mL (ref >5.9)

## 2023-12-30 LAB — VITAMIN B12: Vitamin B-12: 213 pg/mL (ref 180–914)

## 2023-12-30 LAB — HEPARIN LEVEL (UNFRACTIONATED)
Heparin Unfractionated: 0.15 [IU]/mL — ABNORMAL LOW (ref 0.30–0.70)
Heparin Unfractionated: 0.34 [IU]/mL (ref 0.30–0.70)
Heparin Unfractionated: 0.44 [IU]/mL (ref 0.30–0.70)

## 2023-12-30 MED ORDER — LACTATED RINGERS IV SOLN: INTRAVENOUS | Status: AC

## 2023-12-30 MED ORDER — VITAMIN B-12 100 MCG PO TABS
500.0000 ug | ORAL_TABLET | Freq: Every day | ORAL | Status: DC
  Administered 2023-12-30 – 2024-01-04 (×6): 500 ug via ORAL
  Filled 2023-12-30 (×6): qty 5

## 2023-12-30 MED ORDER — PANTOPRAZOLE SODIUM 40 MG IV SOLR
40.0000 mg | Freq: Once | INTRAVENOUS | Status: AC
  Administered 2023-12-30: 40 mg via INTRAVENOUS
  Filled 2023-12-30: qty 10

## 2023-12-30 MED ORDER — SODIUM CHLORIDE 0.9 % IV BOLUS: 1000.0000 mL | Freq: Once | INTRAVENOUS | Status: DC

## 2023-12-30 MED ORDER — PERFLUTREN LIPID MICROSPHERE
1.0000 mL | INTRAVENOUS | Status: AC | PRN
  Administered 2023-12-30: 3 mL via INTRAVENOUS

## 2023-12-30 MED ORDER — LIDOCAINE 5 % EX PTCH
1.0000 | MEDICATED_PATCH | CUTANEOUS | Status: DC
  Administered 2023-12-30 – 2024-01-03 (×5): 1 via TRANSDERMAL
  Filled 2023-12-30 (×5): qty 1

## 2023-12-30 MED ORDER — HEPARIN BOLUS VIA INFUSION
1000.0000 [IU] | Freq: Once | INTRAVENOUS | Status: AC
  Administered 2023-12-30: 1000 [IU] via INTRAVENOUS
  Filled 2023-12-30: qty 1000

## 2023-12-30 NOTE — Progress Notes (Signed)
 ANTICOAGULATION CONSULT NOTE  Pharmacy Consult for Heparin  Indication: atrial fibrillation  Allergies  Allergen Reactions   Latex Rash   Penicillins Other (See Comments)    -Pt stated that this medication "does her no good"    Patient Measurements: Height: 5\' 1"  (154.9 cm) Weight: 99.4 kg (219 lb 3.2 oz) IBW/kg (Calculated) : 47.8 Heparin  Dosing Weight: 70.7 kg  Vital Signs: Temp: 98.2 F (36.8 C) (05/31 2018) Temp Source: Oral (05/31 2018) BP: 154/89 (05/31 2018) Pulse Rate: 96 (05/31 2018)  Labs: Recent Labs    12/29/23 1337 12/29/23 1342 12/29/23 1634 12/30/23 0050 12/30/23 0445 12/30/23 1026 12/30/23 1952  HGB 10.7* 10.9*  --   --  9.7*  --   --   HCT 33.6* 32.0*  --   --  30.6*  --   --   PLT 181  --   --   --  174  --   --   LABPROT 15.1  --   --   --   --   --   --   INR 1.2  --   --   --   --   --   --   HEPARINUNFRC  --   --   --  0.15*  --  0.34 0.44  CREATININE 1.21* 1.30*  --   --  1.08*  --   --   TROPONINIHS 7  --  9  --   --   --   --     Estimated Creatinine Clearance: 44.1 mL/min (A) (by C-G formula based on SCr of 1.08 mg/dL (H)).   Medical History: History reviewed. No pertinent past medical history.  Medications:  Medications Prior to Admission  Medication Sig Dispense Refill Last Dose/Taking   amLODipine (NORVASC) 5 MG tablet Take 5 mg by mouth daily.   12/29/2023   aspirin EC 81 MG tablet Take 81 mg by mouth daily. Swallow whole.   12/28/2023   furosemide (LASIX) 20 MG tablet Take 20 mg by mouth.   Past Month   lisinopril (ZESTRIL) 10 MG tablet Take 10 mg by mouth daily.   12/29/2023   sertraline (ZOLOFT) 25 MG tablet Take 25 mg by mouth daily.   12/28/2023   HYDROcodone-acetaminophen  (NORCO/VICODIN) 5-325 MG tablet Take 1 tablet by mouth every 8 (eight) hours as needed. (Patient not taking: Reported on 12/29/2023)   Not Taking   mupirocin ointment (BACTROBAN) 2 % Apply 1 Application topically 2 (two) times daily. (Patient not taking:  Reported on 12/29/2023)   Not Taking   TOLAK 4 % CREA Apply 1 application  topically at bedtime. (Patient not taking: Reported on 12/29/2023)   Not Taking   zolpidem (AMBIEN) 5 MG tablet Take 5 mg by mouth at bedtime as needed. (Patient not taking: Reported on 12/29/2023)   Not Taking   Scheduled:   lidocaine  1 patch Transdermal Q24H   pantoprazole (PROTONIX) IV  40 mg Intravenous Once   vitamin B-12  500 mcg Oral Daily   Infusions:   heparin  1,250 Units/hr (12/30/23 1358)   lactated ringers     PRN: acetaminophen  **OR** acetaminophen , ondansetron  **OR** ondansetron  (ZOFRAN ) IV  Assessment: 10 yof with a history of depression, HTN. Patient is presenting after a fall. Heparin  per pharmacy consult placed for atrial fibrillation.  CT imaging w/out evidence for acute intracranial abnormality.  Patient is not on anticoagulation prior to arrival. Hgb 10.9; plt 181 PT/INR 15.1/1.2  Repeat heparin  level remains therapeutic.  Goal of  Therapy:  Heparin  level 0.3-0.7 units/ml Monitor platelets by anticoagulation protocol: Yes   Plan:  Continue heparin  1250 units/h Daily heparin  level and CBC  Levin Reamer, PharmD, BCPS, [facility] Clinical Pharmacist [phone] Please check AMION for all [facility] numbers 12/30/2023

## 2023-12-30 NOTE — Plan of Care (Signed)

## 2023-12-30 NOTE — Progress Notes (Signed)
 ANTICOAGULATION CONSULT NOTE  Pharmacy Consult for Heparin  Indication: atrial fibrillation  Allergies  Allergen Reactions   Latex Rash   Penicillins Other (See Comments)    -Pt stated that this medication "does her no good"    Patient Measurements: Height: 5\' 1"  (154.9 cm) Weight: 99.4 kg (219 lb 3.2 oz) IBW/kg (Calculated) : 47.8 Heparin  Dosing Weight: 70.7 kg  Vital Signs: Temp: 97.9 F (36.6 C) (05/30 2318) Temp Source: Oral (05/30 2318) BP: 126/80 (05/30 2318) Pulse Rate: 93 (05/30 2318)  Labs: Recent Labs    12/29/23 1337 12/29/23 1342 12/29/23 1634 12/30/23 0050  HGB 10.7* 10.9*  --   --   HCT 33.6* 32.0*  --   --   PLT 181  --   --   --   LABPROT 15.1  --   --   --   INR 1.2  --   --   --   HEPARINUNFRC  --   --   --  0.15*  CREATININE 1.21* 1.30*  --   --   TROPONINIHS 7  --  9  --     Estimated Creatinine Clearance: 36.6 mL/min (A) (by C-G formula based on SCr of 1.3 mg/dL (H)).   Medical History: History reviewed. No pertinent past medical history.  Medications:  Medications Prior to Admission  Medication Sig Dispense Refill Last Dose/Taking   amLODipine (NORVASC) 5 MG tablet Take 5 mg by mouth daily.   12/29/2023   aspirin EC 81 MG tablet Take 81 mg by mouth daily. Swallow whole.   12/28/2023   furosemide (LASIX) 20 MG tablet Take 20 mg by mouth.   Past Month   lisinopril (ZESTRIL) 10 MG tablet Take 10 mg by mouth daily.   12/29/2023   sertraline (ZOLOFT) 25 MG tablet Take 25 mg by mouth daily.   12/28/2023   HYDROcodone-acetaminophen  (NORCO/VICODIN) 5-325 MG tablet Take 1 tablet by mouth every 8 (eight) hours as needed. (Patient not taking: Reported on 12/29/2023)   Not Taking   mupirocin ointment (BACTROBAN) 2 % Apply 1 Application topically 2 (two) times daily. (Patient not taking: Reported on 12/29/2023)   Not Taking   TOLAK 4 % CREA Apply 1 application  topically at bedtime. (Patient not taking: Reported on 12/29/2023)   Not Taking   zolpidem  (AMBIEN) 5 MG tablet Take 5 mg by mouth at bedtime as needed. (Patient not taking: Reported on 12/29/2023)   Not Taking   Scheduled:   metoprolol  tartrate  25 mg Oral BID   Infusions:   sodium chloride  75 mL (12/29/23 1859)   heparin  1,000 Units/hr (12/29/23 1715)   PRN: acetaminophen  **OR** acetaminophen , ondansetron  **OR** ondansetron  (ZOFRAN ) IV  Assessment: 7 yof with a history of depression, HTN. Patient is presenting after a fall. Heparin  per pharmacy consult placed for atrial fibrillation.  CT imaging w/out evidence for acute intracranial abnormality  Patient is not on anticoagulation prior to arrival. Hgb 10.9; plt 181 PT/INR 15.1/1.2  Heparin  level is subtherapeutic (0.15) on 1000 units/hr. No issues with the infusion or bleeding reported per RN.  Goal of Therapy:  Heparin  level 0.3-0.7 units/ml Monitor platelets by anticoagulation protocol: Yes   Plan:  Give IV heparin  1000 units bolus x 1 Increase heparin  infusion to 1200 units/hr Check anti-Xa level in 8 hours and daily while on heparin  Continue to monitor H&H and platelets  Armanda Bern, PharmD, BCPS 12/30/2023 1:18 AM  Please check AMION for all [facility] phone numbers After 10:00 PM, call  Main Pharmacy [phone]

## 2023-12-30 NOTE — Code Documentation (Signed)
  Patient Name: Barbara Soto   MRN: [id number]   Date of Birth/ Sex: 1941-06-09 , female      Admission Date: 12/29/2023  Attending Provider: Lesa Rape, MD  Primary Diagnosis: New onset atrial fibrillation [facility])   Indication: Pt was in her usual state of health until this PM, when she was noted to be unresponsive and pulseless. Code blue was subsequently called. At the time of arrival on scene, ACLS protocol was underway.  Prior to arrival patient received a few compressions and her pulse was regained.  She was somnolent but responsive to loud verbal stimuli and follows most commands.  She was oriented to self but not place or time.  Opened eyes and wiggle her toes on command but did not move her arms on command.  No pupillary deficits or asymmetry noted, no facial droop, extremities are warm and well-perfused.  Vitals showed regular rate and rhythm and hypertension with systolics initially around 170.  After review of telemetry her heart rate rapidly decreased prior to the event and was beating about 5-10 times per minute.  She was eating just before this.  Over several minutes she began to wake up more but was persistently confused about where she was and what was going on but she did not have any focal neurologic deficits and was moving her upper extremities independently.  She was put on room air and her oxygen saturations went down to about 88 so she was put back on 2 to 4 L supplemental oxygen through nasal cannula with oxygen saturations of 90-95.  Concern for aspiration event so chest x-ray was ordered.  Rapid response team alerted primary team who added other labs including ABG.  Primary covering physician arrived and we handed off to him.   Technical Description:  - CPR performance duration:  2-3 compressions  - Was defibrillation or cardioversion used? No   - Was external pacer placed? Yes  - Was patient intubated pre/post CPR? No   Medications Administered: Y = Yes; Blank =  No Amiodarone    Atropine    Calcium    Epinephrine    Lidocaine    Magnesium    Norepinephrine    Phenylephrine    Sodium bicarbonate    Vasopressin     Post CPR evaluation:  - Final Status - Was patient successfully resuscitated ? Yes - What is current rhythm? NSR - What is current hemodynamic status? Stable  Miscellaneous Information:  - Labs sent, including: CXR, ABG, CMP, lactic acid  - Primary team notified?  Yes  - Family Notified? unknown  - Additional notes/ transfer status: Per primary     Cleven Dallas, DO  12/30/2023, 6:34 PM

## 2023-12-30 NOTE — Progress Notes (Signed)
 Rounding Note   Patient Name: Barbara Soto Date of Encounter: 12/30/2023  [facility] Cardiologist: Robie Cho  Subjective No compalints  Scheduled Meds:  metoprolol  tartrate  25 mg Oral BID   vitamin B-12  500 mcg Oral Daily   Continuous Infusions:  heparin  1,250 Units/hr (12/30/23 1204)   PRN Meds: acetaminophen  **OR** acetaminophen , ondansetron  **OR** ondansetron  (ZOFRAN ) IV   Vital Signs  Vitals:   12/30/23 0955 12/30/23 1209 12/30/23 1212 12/30/23 1215  BP: 120/79 (!) 147/86 121/78 108/65  Pulse: 80 71 87 78  Resp: 18 18 18 18   Temp:  (!) 97.5 F (36.4 C)    TempSrc:  Oral    SpO2: 95% 94% 92% 94%  Weight:      Height:        Intake/Output Summary (Last 24 hours) at 12/30/2023 1324 Last data filed at 12/30/2023 0955 Gross per 24 hour  Intake 300 ml  Output 100 ml  Net 200 ml      12/29/2023    7:01 PM 12/29/2023    1:48 PM  Last 3 Weights  Weight (lbs) 219 lb 3.2 oz 212 lb  Weight (kg) 99.428 kg 96.163 kg      Telemetry Rate controlled afib - Personally Reviewed  ECG  N/a - Personally Reviewed  Physical Exam  GEN: No acute distress.   Neck: No JVD Cardiac: irreg Respiratory: Clear to auscultation bilaterally. GI: Soft, nontender, non-distended  MS: No edema; No deformity. Neuro:  Nonfocal  Psych: Normal affect   Labs High Sensitivity Troponin:   Recent Labs  Lab 12/29/23 1337 12/29/23 1634  TROPONINIHS 7 9     Chemistry Recent Labs  Lab 12/29/23 1337 12/29/23 1342 12/30/23 0445  NA 139 139 140  K 3.9 3.7 4.0  CL 102 102 103  CO2 24  --  26  GLUCOSE 132* 132* 103*  BUN 16 18 14   CREATININE 1.21* 1.30* 1.08*  CALCIUM 8.6*  --  8.5*  PROT 6.6  --   --   ALBUMIN 3.7  --   --   AST 30  --   --   ALT 14  --   --   ALKPHOS 85  --   --   BILITOT 0.9  --   --   GFRNONAA 45*  --  52*  ANIONGAP 13  --  11    Lipids No results for input(s): "CHOL", "TRIG", "HDL", "LABVLDL", "LDLCALC", "CHOLHDL" in the last 168  hours.  Hematology Recent Labs  Lab 12/29/23 1337 12/29/23 1342 12/30/23 0445  WBC 5.4  --  6.0  RBC 3.64*  --  3.34*  3.35*  HGB 10.7* 10.9* 9.7*  HCT 33.6* 32.0* 30.6*  MCV 92.3  --  91.6  MCH 29.4  --  29.0  MCHC 31.8  --  31.7  RDW 14.6  --  14.5  PLT 181  --  174   Thyroid  Recent Labs  Lab 12/29/23 1634  TSH 0.656    BNP Recent Labs  Lab 12/29/23 1337  BNP 182.3*    DDimer No results for input(s): "DDIMER" in the last 168 hours.   Radiology  CT LUMBAR SPINE WO CONTRAST Result Date: 12/30/2023 EXAM: CT OF THE LUMBAR SPINE WITHOUT CONTRAST 12/30/2023 11:42:11 AM TECHNIQUE: CT of the lumbar spine was performed without the administration of intravenous contrast. Multiplanar reformatted images are provided for review. Automated exposure control, iterative reconstruction, and/or weight based adjustment of the mA/kV was utilized to  reduce the radiation dose to as low as reasonably achievable. COMPARISON: Lumbar spine radiograph 01/08/2020 and lumbar spine MRI 07/20/2013. CLINICAL HISTORY: Low back pain, trauma. Back pain. FINDINGS: BONES AND ALIGNMENT: Conventional lumbosacral anatomy is assumed with 5 non-rib-bearing, lumbar-type vertebral bodies. Congenitally short pedicles throughout the lumbar spine. No acute fracture or suspicious bone lesion. Normal alignment. Ankylosis of the bilateral sacroiliac joints. DEGENERATIVE CHANGES: At L2-3, left eccentric disc bulge and left greater than right facet arthropathy result in at least mild central spinal canal stenosis with severe narrowing of the left subarticular zone and moderate bilateral neural foraminal narrowing. At L3-4, disc extrusion and facet arthropathy result in mild spinal canal stenosis and moderate left neural foraminal narrowing. At L4-5, disc bulge and moderate bilateral facet arthropathy result in moderate spinal canal stenosis with severe left and moderate right neural foraminal narrowing. At L5-S1, disc bulge and  facet arthropathy result in severe bilateral neural foraminal narrowing. SOFT TISSUES: No acute abnormality. Atherosclerotic calcifications of the abdominal aorta and its branches. IMPRESSION: 1. No acute traumatic injury to the lumbar spine . 2. Multilevel lumbar spondylosis, worse than L4-5, where there is moderate spinal canal stenosis, severe left and moderate right neural foraminal narrowing. Electronically signed by: Audra Blend MD 12/30/2023 11:54 AM EDT RP Workstation: [id number]   CT CHEST ABDOMEN PELVIS W CONTRAST Result Date: 12/29/2023 CLINICAL DATA:  Blunt trauma, status post fall. Wound bottom of steps. EXAM: CT CHEST, ABDOMEN, AND PELVIS WITH CONTRAST TECHNIQUE: Multidetector CT imaging of the chest, abdomen and pelvis was performed following the standard protocol during bolus administration of intravenous contrast. RADIATION DOSE REDUCTION: This exam was performed according to the departmental dose-optimization program which includes automated exposure control, adjustment of the mA and/or kV according to patient size and/or use of iterative reconstruction technique. CONTRAST:  60mL OMNIPAQUE  IOHEXOL  350 MG/ML SOLN COMPARISON:  None Available. FINDINGS: CT CHEST FINDINGS Cardiovascular: No evidence of acute aortic or traumatic vascular injury. The heart is normal in size. There is a small pericardial effusion. Coronary artery calcifications. Mild aortic atherosclerosis. Mediastinum/Nodes: No mediastinal hemorrhage or hematoma. No mediastinal or hilar adenopathy. No pneumomediastinum. Decompressed esophagus. Lungs/Pleura: No pneumothorax. No pulmonary contusion. Small right pleural effusion appears to be simple fluid density. Hypoventilatory changes in the dependent lungs. Mild subpleural reticulation. Trachea and central airways are clear. Musculoskeletal: No acute fracture of the ribs, sternum, included clavicles or shoulder girdles. Degenerative change throughout the thoracic spine and both  shoulders. No confluent chest wall contusion. CT ABDOMEN PELVIS FINDINGS Hepatobiliary: No hepatic injury or perihepatic hematoma. No focal liver abnormality. Cholecystectomy without biliary dilatation. Pancreas: No evidence of injury. No ductal dilatation or inflammation. Spleen: No splenic injury or perisplenic hematoma. Adrenals/Urinary Tract: No adrenal nodule or hemorrhage. No evidence of renal injury. No hydronephrosis. Homogeneous renal enhancement. Urinary bladder is decompressed. No evidence of bladder injury. Stomach/Bowel: No evidence of bowel injury or mesenteric hematoma. Stomach is decompressed. Small duodenal diverticulum. No bowel wall thickening or inflammation. Small volume of stool throughout the colon. Colonic diverticulosis without diverticulitis. The appendix is not confidently seen. Vascular/Lymphatic: Aortic atherosclerosis. No aortic aneurysm. No vascular injury or retroperitoneal fluid. No bulky adenopathy. Reproductive: Status post hysterectomy. No adnexal masses. Other: No free air or free fluid.  No confluent body wall contusion. Musculoskeletal: Right hip arthroplasty. No periprosthetic lucency. No acute fracture of the pelvis or lumbar spine. Degenerative change of the lumbar spine and left hip. IMPRESSION: 1. No evidence of acute traumatic injury to the chest, abdomen, or pelvis.  2. Small right pleural effusion. Small pericardial effusion. 3. Coronary artery calcifications. 4. Colonic diverticulosis without diverticulitis. Aortic Atherosclerosis (ICD10-I70.0). Electronically Signed   By: Chadwick Colonel M.D.   On: 12/29/2023 16:36   DG Pelvis Portable Result Date: 12/29/2023 CLINICAL DATA:  Trauma, fall. EXAM: PORTABLE PELVIS 1-2 VIEWS COMPARISON:  None Available. FINDINGS: Technically limited by soft tissue attenuation from habitus. No evidence of pelvic fracture. Right hip arthroplasty is only partially included in the field of view, intact were visualized. Left hip  osteoarthritis without hip dislocation. Pubic rami are grossly intact. No pubic symphyseal or sacroiliac diastasis. IMPRESSION: 1. No pelvic fracture. 2. Right hip arthroplasty. 3. Left hip osteoarthritis. Electronically Signed   By: Chadwick Colonel M.D.   On: 12/29/2023 16:29   DG Chest Port 1 View Result Date: 12/29/2023 CLINICAL DATA:  Trauma, fall. EXAM: PORTABLE CHEST 1 VIEW COMPARISON:  Radiograph 08/24/2020 FINDINGS: Low lung volumes limit assessment. Prominent heart size likely accentuated by technique. Artifact projects over the right lung base. No evidence of pneumothorax, pleural effusion or focal airspace disease. No grossly displaced fracture on limited osseous assessment. IMPRESSION: Hypoventilatory chest without evidence of acute traumatic injury. Electronically Signed   By: Chadwick Colonel M.D.   On: 12/29/2023 16:29   CT HEAD WO CONTRAST Result Date: 12/29/2023 CLINICAL DATA:  Head trauma, moderate-severe; Polytrauma, blunt. Level 2 trauma. Fall. Found at the bottom of steps unconscious. EXAM: CT HEAD WITHOUT CONTRAST CT CERVICAL SPINE WITHOUT CONTRAST TECHNIQUE: Multidetector CT imaging of the head and cervical spine was performed following the standard protocol without intravenous contrast. Multiplanar CT image reconstructions of the cervical spine were also generated. RADIATION DOSE REDUCTION: This exam was performed according to the departmental dose-optimization program which includes automated exposure control, adjustment of the mA and/or kV according to patient size and/or use of iterative reconstruction technique. COMPARISON:  CT head and cervical spine 06/30/2008 FINDINGS: CT HEAD FINDINGS Brain: There is no evidence of an acute infarct, intracranial hemorrhage, mass, midline shift, or extra-axial fluid collection. Mild cerebral atrophy is within normal limits for age. Cerebral white matter hypodensities are nonspecific but compatible with mild chronic small vessel ischemic disease.  Vascular: Calcified atherosclerosis at the skull base. No hyperdense vessel. Skull: No acute fracture or suspicious lesion. Sinuses/Orbits: Chronic sinusitis including subtotal opacification of the right sphenoid sinus. Small chronic left mastoid effusion. Bilateral cataract extraction. Other: None. CT CERVICAL SPINE FINDINGS Alignment: No significant subluxation. Skull base and vertebrae: No acute fracture or suspicious lesion. Soft tissues and spinal canal: No prevertebral fluid or swelling. No visible canal hematoma. Disc levels: Mild spondylosis. Advanced multilevel cervical facet arthrosis with facet ankylosis on the left at C2-3 and right at C3-4. Severe neural foraminal stenosis on the right at C3-4 and on the left at C4-5. No evidence of high-grade spinal canal stenosis. Upper chest: Evaluated on the separately reported contemporaneous CT of the chest, abdomen, and pelvis. Other: None. IMPRESSION: 1. No evidence of acute intracranial abnormality or cervical spine fracture. 2. Mild chronic small vessel ischemic disease. Electronically Signed   By: Aundra Lee M.D.   On: 12/29/2023 16:13   CT CERVICAL SPINE WO CONTRAST Result Date: 12/29/2023 CLINICAL DATA:  Head trauma, moderate-severe; Polytrauma, blunt. Level 2 trauma. Fall. Found at the bottom of steps unconscious. EXAM: CT HEAD WITHOUT CONTRAST CT CERVICAL SPINE WITHOUT CONTRAST TECHNIQUE: Multidetector CT imaging of the head and cervical spine was performed following the standard protocol without intravenous contrast. Multiplanar CT image reconstructions of the cervical  spine were also generated. RADIATION DOSE REDUCTION: This exam was performed according to the departmental dose-optimization program which includes automated exposure control, adjustment of the mA and/or kV according to patient size and/or use of iterative reconstruction technique. COMPARISON:  CT head and cervical spine 06/30/2008 FINDINGS: CT HEAD FINDINGS Brain: There is no  evidence of an acute infarct, intracranial hemorrhage, mass, midline shift, or extra-axial fluid collection. Mild cerebral atrophy is within normal limits for age. Cerebral white matter hypodensities are nonspecific but compatible with mild chronic small vessel ischemic disease. Vascular: Calcified atherosclerosis at the skull base. No hyperdense vessel. Skull: No acute fracture or suspicious lesion. Sinuses/Orbits: Chronic sinusitis including subtotal opacification of the right sphenoid sinus. Small chronic left mastoid effusion. Bilateral cataract extraction. Other: None. CT CERVICAL SPINE FINDINGS Alignment: No significant subluxation. Skull base and vertebrae: No acute fracture or suspicious lesion. Soft tissues and spinal canal: No prevertebral fluid or swelling. No visible canal hematoma. Disc levels: Mild spondylosis. Advanced multilevel cervical facet arthrosis with facet ankylosis on the left at C2-3 and right at C3-4. Severe neural foraminal stenosis on the right at C3-4 and on the left at C4-5. No evidence of high-grade spinal canal stenosis. Upper chest: Evaluated on the separately reported contemporaneous CT of the chest, abdomen, and pelvis. Other: None. IMPRESSION: 1. No evidence of acute intracranial abnormality or cervical spine fracture. 2. Mild chronic small vessel ischemic disease. Electronically Signed   By: Aundra Lee M.D.   On: 12/29/2023 16:13      Assessment & Plan   1.Afib with RVR - new diagnosis this admission - started on oral lopressor  25mg  bid,  - CHADS2Vasc score is at least 3, currently on hep gtt. Hgb trending down, workup per primary team. Hold on transition to DOAC and follow H&H, if stable transition tomorrow.  - echo pending  2.Syncope - presented after being found down at the bottom of the steps - she remembers getting to stairs about to walk up, does not recall anything after that. No specific prodrome.  - ongoing workup by primary team - follow up echo,  tele. May require outpatient monitor at discharge.    For questions or updates, please contact [facility] Please consult [email or web address] for contact info under     Signed, Armida Lander, MD  12/30/2023, 1:24 PM

## 2023-12-30 NOTE — Progress Notes (Signed)
 OT Cancellation Note  Patient Details Name: Barbara Soto MRN: [id number] DOB: 1941/08/01   Cancelled Treatment:    Reason Eval/Treat Not Completed: Patient not medically ready (Imminent order recieved, pt with prior L ankle injury, unable to use brace so was supposed to get cast, per MD now pending x-ray. Will hold OT evaluation until further clarification from imaging.)  Emillie Chasen K, OTD, OTR/L [facility] [phone]   Antionette Kirks 12/30/2023, 12:33 PM

## 2023-12-30 NOTE — Progress Notes (Signed)
 PT Cancellation Note  Patient Details Name: Barbara Soto MRN: [id number] DOB: 1941-02-20   Cancelled Treatment:    Reason Eval/Treat Not Completed: Patient declined, no reason specified.  They've done the xray, but I'm not doing any PT.  Will try back tomorrow as able. 12/30/2023  Nohemi Batters., PT Acute Rehabilitation Services [phone]  (office)   Barbara Soto Barbara Soto 12/30/2023, 4:10 PM

## 2023-12-30 NOTE — Progress Notes (Signed)
 Pt rounded on overnight and status discussed with RN, no current critical care requirements.  Barbara Boozer Mayling Aber, PA-C

## 2023-12-30 NOTE — Care Management Obs Status (Signed)
 MEDICARE OBSERVATION STATUS NOTIFICATION   Patient Details  Name: Barbara Soto MRN: [id number] Date of Birth: [birth year]   Medicare Observation Status Notification Given:  Yes    Cindia Crease, RN 12/30/2023, 1:59 PM

## 2023-12-30 NOTE — Progress Notes (Signed)
   12/30/23 2014  Spiritual Encounters  Type of Visit Initial  Care provided to: Patient  Conversation partners present during encounter Nurse  Reason for visit Code  OnCall Visit Yes   Chaplain responded to Code Blue. Pt revived and Chaplain met with Pt but Pt was in physical distress (vomiting) and did not want to see a chaplain. No spiritual need noted.   Chaplain Alphonzo Ask

## 2023-12-30 NOTE — Significant Event (Signed)
 Rapid Response Event Note   Reason for Call :  Code Blue Page/ call from CN saying pt with pulse though agonal  Initial Focused Assessment:  Arrived to room, patient initially somnolent though responds to voice and able to follow commands. Patient oriented x1, confused and increasingly restless. Per report, pt just ambulated to bathroom, back in bed, RN shut door and upon turning around pt not breathing/no pulse. Immediate help called out, a few compressions were done and pulse returned. Upon telemetry review, pt brady down approximately 1755.   225/115 (142) HR 80s RR  O2 91% RA CBG   Interventions/Plan of Care:  Telemetry reviewed CXR O2 placed  EKG zofran  Labs VBG Cards consult pending  145/91 (106)  Event Summary:  MD Notified: Veva Gower DO, Lesa Rape MD, Margherita Shell MD Call Time: [phone] Arrival Time: 38 End Time: 1840  Ever Hiss, RN

## 2023-12-30 NOTE — Significant Event (Signed)
 [facility] PROGRESS NOTE  Patient: Barbara Soto ZOX:[id number]   PCP: No primary care provider on file. DOB: Feb 28, 1941   DOA: 12/29/2023   DOS: 12/30/2023    Subjective: I was asked by Dr. Hadassah Letters to evaluate the patient at bedside after she had a cardiac arrest.  Reportedly patient went to the bathroom.  Came out of the bathroom.  Was eating.  Reportedly had a vomiting episode with spaghetti.  Became bradycardic and unresponsive.  CPR was initiated as the staff was not clear about patient's CODE STATUS.  Within few chest compression patient had pulse.  Rapid response and code team were at bedside.  Patient was still breathing agonal and was being bagged.  After a few minutes she started came back to herself.  Was confused about her surrounding and where she was but was able to follow commands. At the time of my evaluation she felt that she was going to throw up and had some chest pain where the compressions were done.  No other acute complaints or provide the patient.  Objective:  Vitals:   12/30/23 1815 12/30/23 1822 12/30/23 1825 12/30/23 1830  BP: (!) 196/101 (!) 170/91 (!) 173/91 (!) 145/91  Pulse: 89 89 (!) 105 86  Resp:      Temp:      TempSrc:      SpO2: 94% 91% 98% 99%  Weight:      Height:       Clear to auscultation. S1-S2 present irregular. Bowel sound present. Soft nontender abdomen. Clear speech.  Pupils reactive to light.  No facial droop.  Bilateral equal strength.  Spontaneously moving both lower extremities. EKG showed A-fib without any significant ST-T wave changes. Prior EKG did not have any readable rhythm. Telemetry had prolonged asystole with ectopic beats at the time of the event. Chest x-ray evaluated, no significant pneumothorax.  No significant volume overload. Patient on room air.  Able to identify family.  Able to talk with them. Had a BM without any blood. Assessment and plan: Cardiac arrest. Tachybradycardia syndrome. Likely vagal event as  she had an episode of vomiting and aspiration. Brief CPR was performed and she had ROSC. I talk with daughter at the bedside as the patient was a DNR/DNI.  Daughter was appreciated for the effort of the staff placed for CPR and was appreciated with the patient was brought back with the help of the CPR. Primary team have notified cardiology on-call who is going to evaluate the patient as well as notified ICU team who recommended observation for now. Blood pressure is improved. I have discontinued Lopressor . I have ordered lidocaine patch. Her ABG was changed to VBG. Patient currently has pads attached although does not appear to be needing any pacing.  Notified primary team about patient's current condition. Discussed with family in detail with regards to goals of care. Patient is currently DNR/DNI.  Family is aware that the CPR was performed which was able to bring the patient back.  I asked the family that whether they would like to continue with DNR/DNI or would like to switch the patient to full code.  Also asked the family that in an event if the patient requires pacemaker whether they would like to proceed with that kind of procedure or not. Patient was being cleaned after having a BM.  Daughter currently wants to discuss with the patient before she makes any decision.  Daughter would let the staff know about her decisions.  Author: Charlean Congress, MD  [facility] 12/30/2023 7:12 PM   If 7PM-7AM, please contact night-coverage at [email or web address]

## 2023-12-30 NOTE — Progress Notes (Signed)
 Around 1745, this RN check with the patient iif she needs anything. Pt verbalized she needs to go to the bathroom and got up quick. This RN asked the patient to hold for few minutes to take the IV pole unplugged and set the bathroom for her to possibly collect FOB order. Pt went to the bathroom using a walker with this RN's assistance. Pt urinated and moved BM without a problem. Pt was able to return to bed without complaint and laid down. This RN talked to the patient to check the bathroom for possible collection of FOB. Pt did not respond, RN saw pt not breathing and telemonitor shows asystole. This RN yelled or help and code blue was called.

## 2023-12-30 NOTE — Progress Notes (Addendum)
 Patient being attended by staff while using the bathroom, and upon return to bed patient became bradycardic, pulseless, unresponsive, with agonal breathing. Code blue called and patient received one minute of chest compressions at which point patient regained pulse and organized rhythm and became responsive. Code team came to bedside.   This RN spoke with Dr. Bobbetta Burnet via phone and updated him on patient condition. See new orders. This RN also paged Dr. Jolan Natal with cardiology. Dr. Jolan Natal returned call and stated to this RN that he would come up to the unit and review telemetry and assess patient as soon as possible. Dr. Lydia Sams with [facility] also came to bedside to assess patient and update patient's family.

## 2023-12-30 NOTE — Progress Notes (Signed)
 Around 2215, patient slid down to the floor while trying to sit at the edge of her bed after a trip to the bathroom. Per the nurse tech who was at the bedside at the time of the event, patient ambulated without problem back and forth to the bathroom. Patient denied any injury. No change in assessment noted. VS remained stable. Hospitalist notified. Patient's daughter called, but no one answered the phone.

## 2023-12-30 NOTE — Progress Notes (Signed)
 PT Cancellation Note  Patient Details Name: Barbara Soto MRN: [id number] DOB: April 06, 1941   Cancelled Treatment:    Reason Eval/Treat Not Completed: Medical issues which prohibited therapy  (Imminent order recieved, pt with prior L ankle injury, unable to use brace so was supposed to get cast, per MD now pending x-ray. Will hold PT evaluation until further clarification from imaging.)   Will attempt to check back again this afternoon as schedule permits. Rehab dept aware of imminent order.  Jory Ng, PT, DPT [facility]  Rehabilitation Services Physical Therapist Office: [phone] Website: [email or web address]  Alinda Irani 12/30/2023, 1:02 PM

## 2023-12-30 NOTE — Progress Notes (Signed)
 ANTICOAGULATION CONSULT NOTE  Pharmacy Consult for Heparin  Indication: atrial fibrillation  Allergies  Allergen Reactions   Latex Rash   Penicillins Other (See Comments)    -Pt stated that this medication "does her no good"    Patient Measurements: Height: 5\' 1"  (154.9 cm) Weight: 99.4 kg (219 lb 3.2 oz) IBW/kg (Calculated) : 47.8 Heparin  Dosing Weight: 70.7 kg  Vital Signs: Temp: 97.9 F (36.6 C) (05/31 0828) Temp Source: Oral (05/31 0828) BP: 120/79 (05/31 0955) Pulse Rate: 80 (05/31 0955)  Labs: Recent Labs    12/29/23 1337 12/29/23 1342 12/29/23 1634 12/30/23 0050 12/30/23 0445 12/30/23 1026  HGB 10.7* 10.9*  --   --  9.7*  --   HCT 33.6* 32.0*  --   --  30.6*  --   PLT 181  --   --   --  174  --   LABPROT 15.1  --   --   --   --   --   INR 1.2  --   --   --   --   --   HEPARINUNFRC  --   --   --  0.15*  --  0.34  CREATININE 1.21* 1.30*  --   --  1.08*  --   TROPONINIHS 7  --  9  --   --   --     Estimated Creatinine Clearance: 44.1 mL/min (A) (by C-G formula based on SCr of 1.08 mg/dL (H)).   Medical History: History reviewed. No pertinent past medical history.  Medications:  Medications Prior to Admission  Medication Sig Dispense Refill Last Dose/Taking   amLODipine (NORVASC) 5 MG tablet Take 5 mg by mouth daily.   12/29/2023   aspirin EC 81 MG tablet Take 81 mg by mouth daily. Swallow whole.   12/28/2023   furosemide (LASIX) 20 MG tablet Take 20 mg by mouth.   Past Month   lisinopril (ZESTRIL) 10 MG tablet Take 10 mg by mouth daily.   12/29/2023   sertraline (ZOLOFT) 25 MG tablet Take 25 mg by mouth daily.   12/28/2023   HYDROcodone-acetaminophen  (NORCO/VICODIN) 5-325 MG tablet Take 1 tablet by mouth every 8 (eight) hours as needed. (Patient not taking: Reported on 12/29/2023)   Not Taking   mupirocin ointment (BACTROBAN) 2 % Apply 1 Application topically 2 (two) times daily. (Patient not taking: Reported on 12/29/2023)   Not Taking   TOLAK 4 % CREA Apply  1 application  topically at bedtime. (Patient not taking: Reported on 12/29/2023)   Not Taking   zolpidem (AMBIEN) 5 MG tablet Take 5 mg by mouth at bedtime as needed. (Patient not taking: Reported on 12/29/2023)   Not Taking   Scheduled:   metoprolol  tartrate  25 mg Oral BID   vitamin B-12  500 mcg Oral Daily   Infusions:   sodium chloride  75 mL (12/30/23 0955)   heparin  1,200 Units/hr (12/30/23 0133)   PRN: acetaminophen  **OR** acetaminophen , ondansetron  **OR** ondansetron  (ZOFRAN ) IV  Assessment: 22 yof with a history of depression, HTN. Patient is presenting after a fall. Heparin  per pharmacy consult placed for atrial fibrillation.  CT imaging w/out evidence for acute intracranial abnormality.  Patient is not on anticoagulation prior to arrival. Hgb 10.9; plt 181 PT/INR 15.1/1.2  5/31 AM: Heparin  level therapeutic at 0.34 while on 1200 units/hr. Hgb slightly decreased at 9.7, PLT decreased at 174. No issues with infusion and no new bleeding noted per RN. Since on lower end of therapeutic, will increase  infusion rate slightly.   Goal of Therapy:  Heparin  level 0.3-0.7 units/ml Monitor platelets by anticoagulation protocol: Yes   Plan:  Increase heparin  infusion to 1250 units/hr Check confirmatory anti-Xa level in 8 hours and daily while on heparin  Continue to monitor H&H and platelets  Juleen Oakland, PharmD PGY1 Pharmacy Resident 12/30/2023 11:41 AM

## 2023-12-30 NOTE — Progress Notes (Addendum)
 [facility] night cross cover note:   I evaluated the patient at bedside. Per my discussions with the patient as well as the patient's daughter (POA), who is present at bedside, the patient wishes to change her code status to full code.  Per these discussions, I subsequently updated the pt's code status to Full Code.  Additionally, per my discussions with the patient's primary hospitalist, I discussed with the patient and her daughter the possibility of pursuing CT head to further evaluate her episode of LOC with ensuing confusion. Per these discussions, patient/her daughter would prefer to not pursue CT head at this time.   Also, cardiology has assessed the patient this evening and will be subsequently incorporating EP cards. Cardiology feels that it is okay for patient to remain on 6 east for now, but with consideration for transfer to 2 Heart for any additional prolonged pauses on tele resulting in LOC.     Camelia Cavalier, DO Hospitalist

## 2023-12-30 NOTE — Significant Event (Signed)
 Paged that patient was unresponsive with brief CPR with immediate ROSC. 1F with new onset AF/RVR. Started on heparin  gtt and metop tartrate 25 mg bid (LD 05/31 at 0955). Has been rate controlled AF with VR in the 80s throughout the day. Then went to restroom and had BM, returned to bed asx. Nurse went to check on BM and on return to patient she was unresponsive and blue in the face without pulse. CPR for a few seconds with ROSC. She was confused following the episode. Review of telemetry with AF/SVR but no conversion to NSR. AF with  VR in the 15-20s for a few seconds (6 seconds in between VR). Unable to assess vagal component without PP intervals to measure in AF but per patient (and family report) this was similar to what happened prior to admission on 05/30 but in that setting she had gone to her car to get a pillow and had abrupt LOC on return to the front door which was caught on ring camera. She didn't have mechanical fall as she has had in the past. In the past she has had occasional mechanical falls but all without LOC. This time she fell and hit her head with complete LOC. We reviewed the different options for management of AF/RVR and that if this episode was vagally mediated a PPM may not improve sx. That said yesterdays event wasn't likely vagally mediated and she had walked back to her bed without any prodrome this episode. EP will evaluate for management of AF/SVR. Although I think its reasonable to TEE/DCCV and get her back to NSR we won't have any options to control AF/RVR without PPM backup when this recurs in the future. She wants to limit the amount of procedures and hospitalizations required for AF management. For now will keep her on telemetry, if it happens again will transfer to 2H. Discussion regarding DNAR and she is fine changing code status to FULL code and her family agrees. Not on any rate/rhythm meds now.

## 2023-12-30 NOTE — Progress Notes (Signed)
 12/30/2023 Called regarding patient:  Sinus pause, possible aspiration, got a couple compressions.  HD stable.  Cardiology to weigh in Labs pending Code status DNR  Unless recurrent hemodynamic issues, pauses or airway concerns, do not see role for ICU at present.  Would clarify code status, have cardiology review strip, and get labs.  Will have night team eyeball chart.  Ardelle Kos MD PCCM

## 2023-12-30 NOTE — Progress Notes (Signed)
  Echocardiogram 2D Echocardiogram has been performed.  Barbara Soto 12/30/2023, 3:05 PM

## 2023-12-30 NOTE — Significant Event (Incomplete)
 Please refer to CODE documentation. Discussed w/ Dr Felipe Horton from ICU-see note. Signed out to Dr Brock Canner to follow up on patient-labs,cardiology plan/recommendations. Currently pending labs, cardio recs and family's decision on Code status. Covering [facility] at bedside also discussed.

## 2023-12-30 NOTE — Progress Notes (Signed)
 PROGRESS NOTE Barbara Soto  RUE:[id number] DOB: 1941/05/06 DOA: 12/29/2023 PCP: No primary care provider on file.  Brief Narrative/Hospital Course: 82yof w/ history of depression, hypertension brought to the ED by EMS after found down at the bottom of 4 steps with face down in the dirt & body on concrete and was unconscious.  Unknown downtime and when EMS arrived and rolled her over she was still unconscious with agonal breathing, c-collar was applied.  On the way patient was vomiting violently, becoming more alert awake and on arrival alert awake oriented x 2. In the ED she reported not remembering following but reports "feeling funny" prior to the fall, no reported chest pain shortness of breath dizziness, focal weakness.  At baseline patient is ambulatory.  She had recent fall a month ago supposed to have a left ankle brace but unable to use due to brace rubbing on the skin and plan is for the cast after she gets her skin cancer removed which could not happen because her blood pressure was high.  She lives with her son.  On my exam she is alert awake and oriented x 3. In the ED: Vitals tachycardic with A-fib.  Other vital stable, on room air.  Labs showed creatinine 1.2 calcium 8.6 troponin 7, normal lactic acid, hemoglobin 10.7 INR 1.2. Underwent CT head and neck no acute finding mild chronic small vessel disease, CT chest abdomen pelvis> no acute traumatic injury, small right pleural effusion coronary artery calcification and colonic diverticulosis noted. Heparin  drip was started for A-fib since no evidence of internal bleeding, Cardizem  was ordered. Admission requested due to syncope and new onset A-fib with RVR Patient was hydrated with IV fluids creatinine improved from 1.3-1.0.  Seen by cardiology.    Subjective: Seen and examined today Patient has been very eager to go home Hb downtrending C/o back pain since fall- CT l spine ordered. Ct abd showed  DJD on thoracic and lumbar  spine.  Assessment and plan:  Suspected syncope Patient found at the bottom of the steps with facedown unknown downtime New onset A-fib with RVR: OV negative now. Given ivf and creat better indication volume depletion likely contributed to fall. Cardio on board- cont lopressor , heparin  drip- as hb downtrending holding doac. F/u echom . TSH ok.    Hypertension: BP stable. Hold meds-started on metoprolol .  Patient reports blood pressure has been poorly controlled at home.   AKI on CKD3a: Her last creatinine was 0.8 in 2022 in another chart- creat now at 1 from 1.3, hold off further ivf. Encourage oral hydration   Anemia: Normocytic likely chronic-anemia panel as below  Of note hemoglobin was normal 05/12/2021 but in 2091 as low as 6.4 g after hip replacement needing transfusion. Check fobt. Likely hemodilution form ivf. Recent Labs    12/29/23 1337 12/29/23 1342 12/30/23 0445  HGB 10.7* 10.9* 9.7*  MCV 92.3  --  91.6  VITAMINB12  --   --  213  FOLATE  --   --  15.5  FERRITIN  --   --  26  TIBC  --   --  403  IRON  --   --  60  RETICCTPCT  --   --  1.1      Recent left ankle injury: Followed by orthopedics supposed to have cast placed after skin cancer removed from her left leg. Metta Actis lt ankle ok. WBAT may be ok with brace.   Morbid obesity with BMI Body mass index is 40.06 kg/m.  She will benefit with weight loss PCP follow-up and healthy lifestyle  DVT prophylaxis: heparin  Code Status:   Code Status: Limited: Do not attempt resuscitation (DNR) -DNR-LIMITED -Do Not Intubate/DNI  Family Communication: plan of care discussed with patient at bedside. Patient status is: Remains hospitalized because of severity of illness and need to make sure hb is stable on heparin  before transition fo DOAC. Level of care: Telemetry Cardiac   Dispo: The patient is from: home with son            Anticipated disposition: TBD Objective: Vitals last 24 hrs: Vitals:   12/30/23 1209 12/30/23  1212 12/30/23 1215 12/30/23 1500  BP: (!) 147/86 121/78 108/65 (!) 172/99  Pulse: 71 87 78 84  Resp: 18 18 18 16   Temp: (!) 97.5 F (36.4 C)   98 F (36.7 C)  TempSrc: Oral   Oral  SpO2: 94% 92% 94% 95%  Weight:      Height:        Physical Examination: General exam: alert awake, older than stated age HEENT:Oral mucosa moist, Ear/Nose WNL grossly Respiratory system: Bilaterally clear BS, no use of accessory muscle Cardiovascular system: S1 & S2 +. Gastrointestinal system: Abdomen soft, NT,ND,BS+ Nervous System: Alert, awake, following commands. Extremities: LE edema neg, warm extremities Skin: No rashes,warm. MSK: Normal muscle bulk/tone.   Data Reviewed: I have personally reviewed following labs and imaging studies ( see epic result tab) CBC: Recent Labs  Lab 12/29/23 1337 12/29/23 1342 12/30/23 0445  WBC 5.4  --  6.0  HGB 10.7* 10.9* 9.7*  HCT 33.6* 32.0* 30.6*  MCV 92.3  --  91.6  PLT 181  --  174   CMP: Recent Labs  Lab 12/29/23 1337 12/29/23 1342 12/30/23 0445  NA 139 139 140  K 3.9 3.7 4.0  CL 102 102 103  CO2 24  --  26  GLUCOSE 132* 132* 103*  BUN 16 18 14   CREATININE 1.21* 1.30* 1.08*  CALCIUM 8.6*  --  8.5*   GFR: Estimated Creatinine Clearance: 44.1 mL/min (A) (by C-G formula based on SCr of 1.08 mg/dL (H)). Recent Labs  Lab 12/29/23 1337  AST 30  ALT 14  ALKPHOS 85  BILITOT 0.9  PROT 6.6  ALBUMIN 3.7   No results for input(s): "LIPASE", "AMYLASE" in the last 168 hours. No results for input(s): "AMMONIA" in the last 168 hours. Coagulation Profile:  Recent Labs  Lab 12/29/23 1337  INR 1.2   Unresulted Labs (From admission, onward)     Start     Ordered   12/31/23 0500  Heparin  level (unfractionated)  Daily,   R      12/29/23 1658   12/30/23 2000  Heparin  level (unfractionated)  Once-Timed,   TIMED       Question:  Specimen collection method  Answer:  Lab=Lab collect   12/30/23 1143   12/30/23 1359  Occult blood card to lab, stool   Once,   R        12/30/23 1358   12/30/23 0500  Basic metabolic panel with GFR  Daily,   R      12/29/23 1718   12/30/23 0500  CBC  Daily,   R      12/29/23 1718   12/29/23 1743  Urinalysis, Routine w reflex microscopic -Urine, Clean Catch  Once,   R       Question:  Specimen Source  Answer:  Urine, Clean Catch   12/29/23 1742  Antimicrobials/Microbiology: Anti-infectives (From admission, onward)    None      No results found for: "SDES", "SPECREQUEST", "CULT", "REPTSTATUS"  Procedures:  Medications reviewed:  Scheduled Meds:  metoprolol  tartrate  25 mg Oral BID   vitamin B-12  500 mcg Oral Daily   Continuous Infusions:  heparin  1,250 Units/hr (12/30/23 1358)    Lesa Rape, MD [facility] 12/30/2023, 4:30 PM

## 2023-12-30 NOTE — Plan of Care (Signed)
  Problem: Activity: Goal: Risk for activity intolerance will decrease Outcome: Progressing   Problem: Nutrition: Goal: Adequate nutrition will be maintained Outcome: Progressing   Problem: Elimination: Goal: Will not experience complications related to bowel motility Outcome: Progressing   Problem: Pain Managment: Goal: General experience of comfort will improve and/or be controlled Outcome: Progressing   Problem: Safety: Goal: Ability to remain free from injury will improve Outcome: Progressing   Problem: Skin Integrity: Goal: Risk for impaired skin integrity will decrease Outcome: Progressing   Problem: Education: Goal: Knowledge of disease or condition will improve Outcome: Progressing

## 2023-12-31 DIAGNOSIS — R0902 Hypoxemia: Secondary | ICD-10-CM | POA: Diagnosis not present

## 2023-12-31 DIAGNOSIS — E1122 Type 2 diabetes mellitus with diabetic chronic kidney disease: Secondary | ICD-10-CM | POA: Diagnosis not present

## 2023-12-31 DIAGNOSIS — Z23 Encounter for immunization: Secondary | ICD-10-CM | POA: Diagnosis not present

## 2023-12-31 DIAGNOSIS — R0989 Other specified symptoms and signs involving the circulatory and respiratory systems: Secondary | ICD-10-CM | POA: Diagnosis not present

## 2023-12-31 DIAGNOSIS — R55 Syncope and collapse: Principal | ICD-10-CM

## 2023-12-31 DIAGNOSIS — J9811 Atelectasis: Secondary | ICD-10-CM | POA: Diagnosis not present

## 2023-12-31 DIAGNOSIS — R001 Bradycardia, unspecified: Secondary | ICD-10-CM | POA: Diagnosis not present

## 2023-12-31 DIAGNOSIS — Z6841 Body Mass Index (BMI) 40.0 and over, adult: Secondary | ICD-10-CM | POA: Diagnosis not present

## 2023-12-31 DIAGNOSIS — E876 Hypokalemia: Secondary | ICD-10-CM | POA: Diagnosis not present

## 2023-12-31 DIAGNOSIS — I3139 Other pericardial effusion (noninflammatory): Secondary | ICD-10-CM | POA: Diagnosis not present

## 2023-12-31 DIAGNOSIS — N179 Acute kidney failure, unspecified: Secondary | ICD-10-CM | POA: Diagnosis not present

## 2023-12-31 DIAGNOSIS — I4819 Other persistent atrial fibrillation: Secondary | ICD-10-CM | POA: Diagnosis not present

## 2023-12-31 DIAGNOSIS — Z7901 Long term (current) use of anticoagulants: Secondary | ICD-10-CM | POA: Diagnosis not present

## 2023-12-31 DIAGNOSIS — N1831 Chronic kidney disease, stage 3a: Secondary | ICD-10-CM | POA: Diagnosis not present

## 2023-12-31 DIAGNOSIS — D631 Anemia in chronic kidney disease: Secondary | ICD-10-CM | POA: Diagnosis not present

## 2023-12-31 DIAGNOSIS — Z79899 Other long term (current) drug therapy: Secondary | ICD-10-CM | POA: Diagnosis not present

## 2023-12-31 DIAGNOSIS — Z66 Do not resuscitate: Secondary | ICD-10-CM | POA: Diagnosis not present

## 2023-12-31 DIAGNOSIS — I495 Sick sinus syndrome: Secondary | ICD-10-CM | POA: Diagnosis not present

## 2023-12-31 DIAGNOSIS — R569 Unspecified convulsions: Secondary | ICD-10-CM | POA: Diagnosis not present

## 2023-12-31 DIAGNOSIS — I4891 Unspecified atrial fibrillation: Secondary | ICD-10-CM | POA: Diagnosis not present

## 2023-12-31 DIAGNOSIS — R918 Other nonspecific abnormal finding of lung field: Secondary | ICD-10-CM | POA: Diagnosis not present

## 2023-12-31 DIAGNOSIS — W109XXA Fall (on) (from) unspecified stairs and steps, initial encounter: Secondary | ICD-10-CM | POA: Diagnosis present

## 2023-12-31 DIAGNOSIS — D6869 Other thrombophilia: Secondary | ICD-10-CM | POA: Diagnosis not present

## 2023-12-31 DIAGNOSIS — I2510 Atherosclerotic heart disease of native coronary artery without angina pectoris: Secondary | ICD-10-CM | POA: Diagnosis not present

## 2023-12-31 DIAGNOSIS — I700 Atherosclerosis of aorta: Secondary | ICD-10-CM | POA: Diagnosis not present

## 2023-12-31 DIAGNOSIS — I469 Cardiac arrest, cause unspecified: Secondary | ICD-10-CM | POA: Diagnosis not present

## 2023-12-31 DIAGNOSIS — F32A Depression, unspecified: Secondary | ICD-10-CM | POA: Diagnosis not present

## 2023-12-31 DIAGNOSIS — S40211A Abrasion of right shoulder, initial encounter: Secondary | ICD-10-CM | POA: Diagnosis not present

## 2023-12-31 DIAGNOSIS — I130 Hypertensive heart and chronic kidney disease with heart failure and stage 1 through stage 4 chronic kidney disease, or unspecified chronic kidney disease: Secondary | ICD-10-CM | POA: Diagnosis not present

## 2023-12-31 LAB — BASIC METABOLIC PANEL WITH GFR
Anion gap: 8 (ref 5–15)
BUN: 13 mg/dL (ref 8–23)
CO2: 26 mmol/L (ref 22–32)
Calcium: 8.1 mg/dL — ABNORMAL LOW (ref 8.9–10.3)
Chloride: 104 mmol/L (ref 98–111)
Creatinine, Ser: 1.1 mg/dL — ABNORMAL HIGH (ref 0.44–1.00)
GFR, Estimated: 50 mL/min — ABNORMAL LOW (ref >60)
Glucose, Bld: 121 mg/dL — ABNORMAL HIGH (ref 70–99)
Potassium: 3.9 mmol/L (ref 3.5–5.1)
Sodium: 138 mmol/L (ref 135–145)

## 2023-12-31 LAB — CBC
HCT: 30.7 % — ABNORMAL LOW (ref 36.0–46.0)
Hemoglobin: 9.8 g/dL — ABNORMAL LOW (ref 12.0–15.0)
MCH: 29.6 pg (ref 26.0–34.0)
MCHC: 31.9 g/dL (ref 30.0–36.0)
MCV: 92.7 fL (ref 80.0–100.0)
Platelets: 163 10*3/uL (ref 150–400)
RBC: 3.31 MIL/uL — ABNORMAL LOW (ref 3.87–5.11)
RDW: 14.4 % (ref 11.5–15.5)
WBC: 6.9 10*3/uL (ref 4.0–10.5)
nRBC: 0 % (ref 0.0–0.2)

## 2023-12-31 LAB — SURGICAL PCR SCREEN
MRSA, PCR: NEGATIVE
Staphylococcus aureus: NEGATIVE

## 2023-12-31 LAB — HEPARIN LEVEL (UNFRACTIONATED): Heparin Unfractionated: 0.48 [IU]/mL (ref 0.30–0.70)

## 2023-12-31 MED ORDER — SERTRALINE HCL 50 MG PO TABS
25.0000 mg | ORAL_TABLET | Freq: Every day | ORAL | Status: DC
  Administered 2024-01-01 – 2024-01-04 (×4): 25 mg via ORAL
  Filled 2023-12-31 (×4): qty 1

## 2023-12-31 MED ORDER — TRAMADOL HCL 50 MG PO TABS
50.0000 mg | ORAL_TABLET | Freq: Two times a day (BID) | ORAL | Status: DC | PRN
  Administered 2023-12-31 – 2024-01-01 (×2): 50 mg via ORAL
  Filled 2023-12-31 (×2): qty 1

## 2023-12-31 MED ORDER — SODIUM CHLORIDE 0.9 % IV SOLN
80.0000 mg | INTRAVENOUS | Status: AC
  Filled 2023-12-31: qty 2

## 2023-12-31 MED ORDER — SODIUM CHLORIDE 0.9% FLUSH: 3.0000 mL | Freq: Two times a day (BID) | INTRAVENOUS | Status: DC

## 2023-12-31 MED ORDER — CEFAZOLIN SODIUM-DEXTROSE 2-4 GM/100ML-% IV SOLN
2.0000 g | INTRAVENOUS | Status: AC
  Filled 2023-12-31: qty 100

## 2023-12-31 MED ORDER — SODIUM CHLORIDE 0.9% FLUSH: 3.0000 mL | INTRAVENOUS | Status: DC | PRN

## 2023-12-31 NOTE — Plan of Care (Signed)

## 2023-12-31 NOTE — Consult Note (Signed)
 Electrophysiology consultation   Patient ID: Barbara Soto MRN: [id number]; DOB: [birth year]  Admit date: 12/29/2023 Date of Consult: 12/31/2023  PCP:  No primary care provider on file.   History of Present Illness:   Ms. Barbara Soto is an 83 year old woman who I am asked to see today for an evaluation of abnormal telemetry at the request of Dr. Jolan Natal.  The patient has been seen this admission by general cardiology.  She has a history of depression, hypertension and syncope.  She was admitted to the hospital on May 30 after being found down at the bottom of 4 steps.  EMS was called and found her with agonal breathing.  She was taken to the emergency department where she was noted to be in atrial fibrillation.  The initial workup was unrevealing.  Yesterday evening, the patient lost consciousness shortly after walking to the restroom.  She got back into the bed and the nurse at the door.  When the nurse turned back around to look at the patient she was not breathing and was pulseless.  Several compressions were provided by the nurse and then she regained consciousness.  Family is at the bedside.  She reports feeling a "funny feeling" prior to her syncopal episodes.  It has occurred both times.  When asked to elaborate it is somewhat difficult.  She describes potentially feeling lightheaded and like she "might pass out".  No chest pain.  She does not recall GI distress prior to the events.    Past medical, surgical, social and family history reviewed.  ROS:  Please see the history of present illness.  All other ROS reviewed and negative.     Physical Exam/Data:   Vitals:   12/30/23 2018 12/30/23 2338 12/31/23 0150 12/31/23 0637  BP: (!) 154/89 (!) 157/93 (!) 149/82 (!) 174/86  Pulse: 96 87 89 93  Resp: 16 16  16   Temp: 98.2 F (36.8 C) 97.8 F (36.6 C)  97.7 F (36.5 C)  TempSrc: Oral Oral  Oral  SpO2: 100% 100% 100% 100%  Weight:      Height:        General: Elderly Cardiac:   normal S1, S2; irregularly irregular; no murmur  Lungs:  clear to auscultation bilaterally, no wheezing, rhonchi or rales  Psych:  Normal affect   EKG:  The EKG was personally reviewed and demonstrates: Dec 29, 2023 EKG shows sinus rhythm.  QRS duration 97 ms.  Dec 30, 2023 EKG shows atrial fibrillation, QRS duration 102 ms.   Telemetry:  Telemetry was personally reviewed and demonstrates: Telemetry reviewed from time of syncopal episode.  Around 1755 yesterday evening, the patient was in atrial fibrillation.  She began having infrequent multifocal PVCs.  The heart rate abruptly slowed but the patient remained in atrial fibrillation.  For almost 2 minutes her heart rate was less than 20 bpm.  She then returns to atrial fibrillation with rapidly conducted ventricular rates.  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  It could have been a delayed onset of the vagal reaction after returning from the restroom yesterday.  The syncopal episode is marked by a significant and rapid decrease in heart rates.  No clear reversible causes.  I have recommended a permanent pacemaker.  Will need to be a dual-chamber device.  I have discussed the pacemaker implant procedure in detail  the patient including the risks, recovery.  She would like to proceed.  Risks, benefits, alternatives to PPM implantation were discussed in detail with the patient today. The patient understands that the risks include but are not limited to bleeding, infection, pneumothorax, perforation, tamponade, vascular damage, renal failure, MI, stroke, death, and lead dislodgement and wishes to proceed.  We will therefore schedule device implantation at the next available time.   Keep NPO in case we are able to add onto tomorrow's schedule.  Avoid anticoagulation right now.  #Atrial  fibrillation Unclear duration.  No plans for cardioversion while inpatient.  Would consider after pacemaker has healed in the outpatient setting.  This was explained to the patient and family this morning.     Donelda Fujita T. Marven Slimmer, MD, [facility], [facility] Cardiac Electrophysiology

## 2023-12-31 NOTE — Progress Notes (Signed)
 PT Cancellation Note  Patient Details Name: Barbara Soto MRN: [id number] DOB: 1941-05-24   Cancelled Treatment:    Reason Eval/Treat Not Completed: Patient not medically ready (Patient not medically ready (pt with code blue 5/31 PM), now with plan for pacemaker. Acute PT to follow and re-attempt as medically appropriate.)  [name], PT, DPT Secure Chat Preferred  Rehab Office [phone]  [name] 12/31/2023, 9:56 AM

## 2023-12-31 NOTE — Progress Notes (Signed)
 PROGRESS NOTE Barbara Soto  EAV:[id number] DOB: [birth year] DOA: 12/29/2023 PCP: No primary care provider on file.  Brief Narrative/Hospital Course: 82yof w/ history of depression, hypertension brought to the ED by EMS after found down at the bottom of 4 steps with face down in the dirt & body on concrete and was unconscious.  Unknown downtime and when EMS arrived and rolled her over she was still unconscious with agonal breathing, c-collar was applied.  On the way patient was vomiting violently, becoming more alert awake and on arrival alert awake oriented x 2. In the ED she reported not remembering following but reports "feeling funny" prior to the fall, no reported chest pain shortness of breath dizziness, focal weakness.  At baseline patient is ambulatory.  She had recent fall a month ago supposed to have a left ankle brace but unable to use due to brace rubbing on the skin and plan is for the cast after she gets her skin cancer removed which could not happen because her blood pressure was high.  She lives with her son.  On my exam she is alert awake and oriented x 3. In the ED: Vitals tachycardic with A-fib.  Other vital stable, on room air.  Labs showed creatinine 1.2 calcium 8.6 troponin 7, normal lactic acid, hemoglobin 10.7 INR 1.2. Underwent CT head and neck no acute finding mild chronic small vessel disease, CT chest abdomen pelvis> no acute traumatic injury, small right pleural effusion coronary artery calcification and colonic diverticulosis noted. Heparin  drip was started for A-fib since no evidence of internal bleeding, Cardizem  was ordered. Admission requested due to syncope and new onset A-fib with RVR  Subjective:  Patient had acute events yesterday evening consistent with syncope with pauses needing chest compressions and remained stable overnight.  Seen with daughter at the bedside no new complaints Resting comfortably.  Assessment and plan:  Syncope: Suspected mild vasovagal  syncope with long pauses in house up to 2-minute heart rate less than 20 needing brief chest compressions code activation 5/31 evening.  Episode of syncope at home Patient and family wants to proceed further treatment and cardiology planning for PPM implantation. On heparin  drip continue the same.  Continue lidocaine patch and pain management for chest compression pain  New onset A-fib: TSH is stable echo EF 60-60% moderately dilated left atrium trivial MR moderate TR no aortic stenosis Continue A-fib until midnight and hold for pacemaker placement discussed with Dr. Marven Slimmer  Hypertension: BP remains stable noted event above [place] discontinued . home meds have not been resumed    AKI on CKD3a: Her last creatinine was 0.8 in 2022 in another chart- creat peaked at 1.3, improving on IV fluids.  Encourage oral intake Recent Labs    12/29/23 1337 12/29/23 1342 12/30/23 0445 12/30/23 1952 12/31/23 0447  BUN 16 18 14 13 13   CREATININE 1.21* 1.30* 1.08* 1.21* 1.10*  CO2 24  --  26 27 26   K 3.9 3.7 4.0 4.2 3.9    Normocytic anemia: Normocytic likely chronic-anemia panel as below stable -Of note hemoglobin was normal 05/12/2021 but in 2091 as low as 6.4 g after hip replacement needing transfusion.  FOBT is negative hemoglobin holding stable above 9 , Likely hemodilution from ivf.  Continue B12 supplementation. Recent Labs    12/29/23 1337 12/29/23 1342 12/30/23 0445 12/31/23 0447  HGB 10.7* 10.9* 9.7* 9.8*  MCV 92.3  --  91.6 92.7  VITAMINB12  --   --  213  --   FOLATE  --   --  15.5  --   FERRITIN  --   --  26  --   TIBC  --   --  403  --   IRON  --   --  60  --   RETICCTPCT  --   --  1.1  --     Recent left ankle injury: Followed by orthopedics supposed to have cast placed after skin cancer removed from her left leg. Metta Actis lt ankle ok.  WBAT may be ok with brace.   Morbid Obesity w/ Body mass index is 41.42 kg/m.:Will benefit with PCP follow-up, weight loss,healthy lifestyle  and outpatient sleep eval if not done.  DVT prophylaxis: Heparin  Code Status:   Code Status: Full Code Family Communication: plan of care discussed with patient/daughter at bedside. Patient status is: Remains hospitalized because of severity of illness Level of care: Telemetry Cardiac   Dispo: The patient is from: home            Anticipated disposition: TBD Objective: Vitals last 24 hrs: Vitals:   12/30/23 2338 12/31/23 0150 12/31/23 0637 12/31/23 0818  BP: (!) 157/93 (!) 149/82 (!) 174/86 (!) 165/93  Pulse: 87 89 93 94  Resp: 16  16 16   Temp: 97.8 F (36.6 C)  97.7 F (36.5 C) 97.7 F (36.5 C)  TempSrc: Oral  Oral Oral  SpO2: 100% 100% 100% 100%  Weight:      Height:        Physical Examination: General exam: alert awake, older than stated age HEENT:Oral mucosa moist, Ear/Nose WNL grossly Respiratory system: Bilaterally clear BS, no use of accessory muscle Cardiovascular system: S1 & S2 +. Gastrointestinal system: Abdomen soft,NT,ND,BS+ Nervous System: Alert, awake, following commands. Extremities: LE edema neg, warm extremities Skin: No rashes,warm. MSK: Normal muscle bulk/tone.   Data Reviewed: I have personally reviewed following labs and imaging studies ( see epic result tab) CBC: Recent Labs  Lab 12/29/23 1337 12/29/23 1342 12/30/23 0445 12/31/23 0447  WBC 5.4  --  6.0 6.9  HGB 10.7* 10.9* 9.7* 9.8*  HCT 33.6* 32.0* 30.6* 30.7*  MCV 92.3  --  91.6 92.7  PLT 181  --  174 163   CMP: Recent Labs  Lab 12/29/23 1337 12/29/23 1342 12/30/23 0445 12/30/23 1952 12/31/23 0447  NA 139 139 140 139 138  K 3.9 3.7 4.0 4.2 3.9  CL 102 102 103 103 104  CO2 24  --  26 27 26   GLUCOSE 132* 132* 103* 135* 121*  BUN 16 18 14 13 13   CREATININE 1.21* 1.30* 1.08* 1.21* 1.10*  CALCIUM 8.6*  --  8.5* 8.5* 8.1*  MG  --   --   --  1.9  --    GFR: Estimated Creatinine Clearance: 43.3 mL/min (A) (by C-G formula based on SCr of 1.1 mg/dL (H)). Recent Labs  Lab  12/29/23 1337 12/30/23 1952  AST 30 30  ALT 14 15  ALKPHOS 85 75  BILITOT 0.9 1.4*  PROT 6.6 6.5  ALBUMIN 3.7 3.4*   No results for input(s): "LIPASE", "AMYLASE" in the last 168 hours. No results for input(s): "AMMONIA" in the last 168 hours. Coagulation Profile:  Recent Labs  Lab 12/29/23 1337  INR 1.2   Unresulted Labs (From admission, onward)     Start     Ordered   12/31/23 0840  Surgical PCR screen  (Screening)  Once,   R        12/31/23 0839   12/31/23 0500  Heparin  level (  unfractionated)  Daily,   R      12/29/23 1658   12/30/23 0500  Basic metabolic panel with GFR  Daily,   R      12/29/23 1718   12/30/23 0500  CBC  Daily,   R      12/29/23 1718   12/29/23 1743  Urinalysis, Routine w reflex microscopic -Urine, Clean Catch  Once,   R       Question:  Specimen Source  Answer:  Urine, Clean Catch   12/29/23 1742           Antimicrobials/Microbiology: Anti-infectives (From admission, onward)    Start     Dose/Rate Route Frequency Ordered Stop   12/31/23 0930  gentamicin (GARAMYCIN) 80 mg in sodium chloride  0.9 % 500 mL irrigation        80 mg Irrigation On call 12/31/23 0839 01/01/24 0930   12/31/23 0930  ceFAZolin (ANCEF) IVPB 2g/100 mL premix        2 g 200 mL/hr over 30 Minutes Intravenous On call 12/31/23 0839 01/01/24 0930      No results found for: "SDES", "SPECREQUEST", "CULT", "REPTSTATUS"  Procedures:  Medications reviewed:  Scheduled Meds:  gentamicin (GARAMYCIN) 80 mg in sodium chloride  0.9 % 500 mL irrigation  80 mg Irrigation On Call   lidocaine  1 patch Transdermal Q24H   sodium chloride  flush  3-10 mL Intravenous Q12H   vitamin B-12  500 mcg Oral Daily   Continuous Infusions:   ceFAZolin (ANCEF) IV     heparin  1,250 Units/hr (12/31/23 1002)   lactated ringers 75 mL/hr at 12/31/23 0957   Lesa Rape, MD [facility] 12/31/2023, 10:40 AM

## 2023-12-31 NOTE — Progress Notes (Signed)
 OT Cancellation Note  Patient Details Name: Barbara Soto MRN: [id number] DOB: 17-Nov-1940   Cancelled Treatment:    Reason Eval/Treat Not Completed: Patient not medically ready (pt with code blue 5/31 PM, now with plan for pacemaker, will hold at this time and evaluate as appropriate.)  Lafayette Dunlevy K, OTD, OTR/L SecureChat [facility] [phone]   Benedict Brain Koonce 12/31/2023, 9:50 AM

## 2023-12-31 NOTE — Progress Notes (Signed)
 ANTICOAGULATION CONSULT NOTE  Pharmacy Consult for Heparin  Indication: atrial fibrillation  Allergies  Allergen Reactions   Latex Rash   Penicillins Other (See Comments)    -Pt stated that this medication "does her no good"    Patient Measurements: Height: 5\' 1"  (154.9 cm) Weight: 99.4 kg (219 lb 3.2 oz) IBW/kg (Calculated) : 47.8 Heparin  Dosing Weight: 70.7 kg  Vital Signs: Temp: 97.7 F (36.5 C) (06/01 0637) Temp Source: Oral (06/01 0637) BP: 174/86 (06/01 0637) Pulse Rate: 93 (06/01 0637)  Labs: Recent Labs    12/29/23 1337 12/29/23 1342 12/29/23 1634 12/30/23 0050 12/30/23 0445 12/30/23 1026 12/30/23 1952 12/31/23 0447  HGB 10.7* 10.9*  --   --  9.7*  --   --  9.8*  HCT 33.6* 32.0*  --   --  30.6*  --   --  30.7*  PLT 181  --   --   --  174  --   --  163  LABPROT 15.1  --   --   --   --   --   --   --   INR 1.2  --   --   --   --   --   --   --   HEPARINUNFRC  --   --   --    < >  --  0.34 0.44 0.48  CREATININE 1.21* 1.30*  --   --  1.08*  --  1.21* 1.10*  TROPONINIHS 7  --  9  --   --   --  11  --    < > = values in this interval not displayed.    Estimated Creatinine Clearance: 43.3 mL/min (A) (by C-G formula based on SCr of 1.1 mg/dL (H)).   Medical History: History reviewed. No pertinent past medical history.  Medications:  Medications Prior to Admission  Medication Sig Dispense Refill Last Dose/Taking   amLODipine (NORVASC) 5 MG tablet Take 5 mg by mouth daily.   12/29/2023   aspirin EC 81 MG tablet Take 81 mg by mouth daily. Swallow whole.   12/28/2023   furosemide (LASIX) 20 MG tablet Take 20 mg by mouth.   Past Month   lisinopril (ZESTRIL) 10 MG tablet Take 10 mg by mouth daily.   12/29/2023   sertraline (ZOLOFT) 25 MG tablet Take 25 mg by mouth daily.   12/28/2023   HYDROcodone-acetaminophen  (NORCO/VICODIN) 5-325 MG tablet Take 1 tablet by mouth every 8 (eight) hours as needed. (Patient not taking: Reported on 12/29/2023)   Not Taking   mupirocin  ointment (BACTROBAN) 2 % Apply 1 Application topically 2 (two) times daily. (Patient not taking: Reported on 12/29/2023)   Not Taking   TOLAK 4 % CREA Apply 1 application  topically at bedtime. (Patient not taking: Reported on 12/29/2023)   Not Taking   zolpidem (AMBIEN) 5 MG tablet Take 5 mg by mouth at bedtime as needed. (Patient not taking: Reported on 12/29/2023)   Not Taking   Scheduled:   lidocaine  1 patch Transdermal Q24H   vitamin B-12  500 mcg Oral Daily   Infusions:   heparin  1,250 Units/hr (12/31/23 0359)   lactated ringers 75 mL/hr at 12/31/23 0359   PRN: acetaminophen  **OR** acetaminophen , ondansetron  **OR** ondansetron  (ZOFRAN ) IV  Assessment: 70 yof with a history of depression, HTN. Patient is presenting after a fall. Heparin  per pharmacy consult placed for atrial fibrillation.  CT imaging w/out evidence for acute intracranial abnormality.  Patient is not on  anticoagulation prior to arrival. Hgb 10.9; plt 181 PT/INR 15.1/1.2  6/1 AM: Heparin  level remains therapeutic at 0.48 while on 1250 units/hr. CBC stable. No issues with infusion and no new bleeding noted per RN.   Goal of Therapy:  Heparin  level 0.3-0.7 units/ml Monitor platelets by anticoagulation protocol: Yes   Plan:  Continue heparin  1250 units/h Daily heparin  level and CBC Follow-up EP consult  Juleen Oakland, PharmD PGY1 Pharmacy Resident 12/31/2023 8:16 AM

## 2024-01-01 ENCOUNTER — Other Ambulatory Visit (HOSPITAL_COMMUNITY): Payer: Self-pay

## 2024-01-01 ENCOUNTER — Inpatient Hospital Stay (HOSPITAL_COMMUNITY)
Admission: EM | Disposition: A | Discharge status: Home Health | Payer: Self-pay | Source: Home / Self Care | Attending: Internal Medicine

## 2024-01-01 DIAGNOSIS — I4891 Unspecified atrial fibrillation: Secondary | ICD-10-CM | POA: Diagnosis not present

## 2024-01-01 DIAGNOSIS — R55 Syncope and collapse: Secondary | ICD-10-CM | POA: Diagnosis not present

## 2024-01-01 DIAGNOSIS — R001 Bradycardia, unspecified: Secondary | ICD-10-CM | POA: Diagnosis not present

## 2024-01-01 HISTORY — PX: PACEMAKER IMPLANT: EP1218

## 2024-01-01 LAB — CBC
HCT: 30.7 % — ABNORMAL LOW (ref 36.0–46.0)
Hemoglobin: 9.7 g/dL — ABNORMAL LOW (ref 12.0–15.0)
MCH: 29.4 pg (ref 26.0–34.0)
MCHC: 31.6 g/dL (ref 30.0–36.0)
MCV: 93 fL (ref 80.0–100.0)
Platelets: 140 10*3/uL — ABNORMAL LOW (ref 150–400)
RBC: 3.3 MIL/uL — ABNORMAL LOW (ref 3.87–5.11)
RDW: 14.3 % (ref 11.5–15.5)
WBC: 6.1 10*3/uL (ref 4.0–10.5)
nRBC: 0 % (ref 0.0–0.2)

## 2024-01-01 LAB — BASIC METABOLIC PANEL WITH GFR
Anion gap: 7 (ref 5–15)
BUN: 10 mg/dL (ref 8–23)
CO2: 27 mmol/L (ref 22–32)
Calcium: 8.1 mg/dL — ABNORMAL LOW (ref 8.9–10.3)
Chloride: 102 mmol/L (ref 98–111)
Creatinine, Ser: 0.93 mg/dL (ref 0.44–1.00)
GFR, Estimated: >60 mL/min (ref >60)
Glucose, Bld: 94 mg/dL (ref 70–99)
Potassium: 3.4 mmol/L — ABNORMAL LOW (ref 3.5–5.1)
Sodium: 136 mmol/L (ref 135–145)

## 2024-01-01 SURGERY — PACEMAKER IMPLANT

## 2024-01-01 MED ORDER — LIDOCAINE HCL (PF) 1 % IJ SOLN
INTRAMUSCULAR | Status: AC
  Filled 2024-01-01: qty 60

## 2024-01-01 MED ORDER — METHOCARBAMOL 500 MG PO TABS
500.0000 mg | ORAL_TABLET | Freq: Four times a day (QID) | ORAL | Status: DC | PRN
  Administered 2024-01-01 – 2024-01-03 (×5): 500 mg via ORAL
  Filled 2024-01-01 (×5): qty 1

## 2024-01-01 MED ORDER — SODIUM CHLORIDE 0.9 % IV SOLN
INTRAVENOUS | Status: AC
  Filled 2024-01-01: qty 2

## 2024-01-01 MED ORDER — FENTANYL CITRATE (PF) 100 MCG/2ML IJ SOLN
INTRAMUSCULAR | Status: DC | PRN
  Administered 2024-01-01: 12.5 ug via INTRAVENOUS

## 2024-01-01 MED ORDER — CEFAZOLIN SODIUM-DEXTROSE 2-4 GM/100ML-% IV SOLN
INTRAVENOUS | Status: AC
  Administered 2024-01-01: 2 g via INTRAVENOUS
  Filled 2024-01-01: qty 100

## 2024-01-01 MED ORDER — LIDOCAINE HCL (PF) 1 % IJ SOLN
INTRAMUSCULAR | Status: DC | PRN
  Administered 2024-01-01: 50 mL

## 2024-01-01 MED ORDER — MIDAZOLAM HCL 2 MG/2ML IJ SOLN
INTRAMUSCULAR | Status: AC
  Filled 2024-01-01: qty 2

## 2024-01-01 MED ORDER — FENTANYL CITRATE (PF) 100 MCG/2ML IJ SOLN
INTRAMUSCULAR | Status: AC
Start: 2024-01-01 — End: 2024-01-01
  Filled 2024-01-01: qty 2

## 2024-01-01 MED ORDER — ACETAMINOPHEN 500 MG PO TABS
1000.0000 mg | ORAL_TABLET | Freq: Three times a day (TID) | ORAL | Status: DC
  Administered 2024-01-01 – 2024-01-04 (×7): 1000 mg via ORAL
  Filled 2024-01-01 (×9): qty 2

## 2024-01-01 MED ORDER — ACETAMINOPHEN 325 MG PO TABS
325.0000 mg | ORAL_TABLET | ORAL | Status: DC | PRN
  Administered 2024-01-02: 650 mg via ORAL
  Filled 2024-01-01 (×2): qty 2

## 2024-01-01 MED ORDER — MIDAZOLAM HCL 5 MG/5ML IJ SOLN
INTRAMUSCULAR | Status: DC | PRN
  Administered 2024-01-01: .5 mg via INTRAVENOUS

## 2024-01-01 MED ORDER — TRAMADOL HCL 50 MG PO TABS
50.0000 mg | ORAL_TABLET | Freq: Three times a day (TID) | ORAL | Status: DC | PRN
  Administered 2024-01-02 – 2024-01-03 (×2): 50 mg via ORAL
  Filled 2024-01-01 (×2): qty 1

## 2024-01-01 MED ORDER — POTASSIUM CHLORIDE 10 MEQ/100ML IV SOLN
10.0000 meq | Freq: Once | INTRAVENOUS | Status: AC
  Administered 2024-01-01: 10 meq via INTRAVENOUS
  Filled 2024-01-01: qty 100

## 2024-01-01 MED ORDER — HEPARIN (PORCINE) IN NACL 1000-0.9 UT/500ML-% IV SOLN
INTRAVENOUS | Status: DC | PRN
Start: 2024-01-01 — End: 2024-01-01
  Administered 2024-01-01: 500 mL

## 2024-01-01 SURGICAL SUPPLY — 12 items
CABLE SURGICAL S-101-97-12 (CABLE) ×1 IMPLANT
ELECT DEFIB PAD ADLT CADENCE (PAD) IMPLANT
KIT ACCESSORY SELECTRA FIX CVD (MISCELLANEOUS) IMPLANT
LEAD SELECTRA 3D-65-42 (CATHETERS) IMPLANT
LEAD SOLIA S PRO MRI 53 (Lead) IMPLANT
LEAD SOLIA S PRO MRI 60 (Lead) IMPLANT
PACEMAKER AMVIA DR-T 460163 (Pacemaker) IMPLANT
PAD DEFIB RADIO PHYSIO CONN (PAD) ×1 IMPLANT
SHEATH 7FR PRELUDE SNAP 13 (SHEATH) IMPLANT
SHEATH 9FR PRELUDE SNAP 13 (SHEATH) IMPLANT
TRAY PACEMAKER INSERTION (PACKS) ×1 IMPLANT
WIRE HI TORQ VERSACORE-J 145CM (WIRE) IMPLANT

## 2024-01-01 NOTE — Plan of Care (Signed)
  Problem: Education: Goal: Knowledge of General Education information will improve Description: Including pain rating scale, medication(s)/side effects and non-pharmacologic comfort measures Outcome: Progressing   Problem: Health Behavior/Discharge Planning: Goal: Ability to manage health-related needs will improve Outcome: Progressing   Problem: Clinical Measurements: Goal: Ability to maintain clinical measurements within normal limits will improve Outcome: Progressing Goal: Will remain free from infection Outcome: Progressing Goal: Diagnostic test results will improve Outcome: Progressing Goal: Respiratory complications will improve Outcome: Progressing Goal: Cardiovascular complication will be avoided Outcome: Progressing   Problem: Activity: Goal: Risk for activity intolerance will decrease Outcome: Progressing   Problem: Nutrition: Goal: Adequate nutrition will be maintained Outcome: Progressing   Problem: Coping: Goal: Level of anxiety will decrease Outcome: Progressing   Problem: Elimination: Goal: Will not experience complications related to bowel motility Outcome: Progressing Goal: Will not experience complications related to urinary retention Outcome: Progressing   Problem: Pain Managment: Goal: General experience of comfort will improve and/or be controlled Outcome: Progressing   Problem: Safety: Goal: Ability to remain free from injury will improve Outcome: Progressing   Problem: Skin Integrity: Goal: Risk for impaired skin integrity will decrease Outcome: Progressing   Problem: Education: Goal: Knowledge of disease or condition will improve Outcome: Progressing Goal: Understanding of medication regimen will improve Outcome: Progressing Goal: Individualized Educational Video(s) Outcome: Progressing   Problem: Activity: Goal: Ability to tolerate increased activity will improve Outcome: Progressing   Problem: Cardiac: Goal: Ability to achieve  and maintain adequate cardiopulmonary perfusion will improve Outcome: Progressing   Problem: Health Behavior/Discharge Planning: Goal: Ability to safely manage health-related needs after discharge will improve Outcome: Progressing   Problem: Education: Goal: Knowledge of cardiac device and self-care will improve Outcome: Progressing Goal: Ability to safely manage health related needs after discharge will improve Outcome: Progressing Goal: Individualized Educational Video(s) Outcome: Progressing   Problem: Cardiac: Goal: Ability to achieve and maintain adequate cardiopulmonary perfusion will improve Outcome: Progressing

## 2024-01-01 NOTE — TOC CAGE-AID Note (Signed)
 Transition of Care [facility]) - CAGE-AID Screening   Patient Details  Name: Barbara Soto MRN: [id number] Date of Birth: 07/29/1941  Transition of Care [facility]) CM/SW Contact:    Andriel Omalley E Miria Cappelli, LCSW Phone Number: 01/01/2024, 11:50 AM   Clinical Narrative:    CAGE-AID Screening:    Have You Ever Felt You Ought to Cut Down on Your Drinking or Drug Use?: No Have People Annoyed You By [employer] Your Drinking Or Drug Use?: No Have You Felt Bad Or Guilty About Your Drinking Or Drug Use?: No Have You Ever Had a Drink or Used Drugs First Thing In The Morning to Steady Your Nerves or to Get Rid of a Hangover?: No CAGE-AID Score: 0  Substance Abuse Education Offered: No

## 2024-01-01 NOTE — Progress Notes (Signed)
 Rounding Note   Patient Name: Barbara Soto Date of Encounter: 01/01/2024  [facility] Cardiologist: None   Subjective Nauseous earlier, but feeling better now   Scheduled Meds:  gentamicin (GARAMYCIN) 80 mg in sodium chloride  0.9 % 500 mL irrigation  80 mg Irrigation On Call   lidocaine  1 patch Transdermal Q24H   sertraline  25 mg Oral Daily   sodium chloride  flush  3-10 mL Intravenous Q12H   vitamin B-12  500 mcg Oral Daily   Continuous Infusions:   ceFAZolin (ANCEF) IV     PRN Meds: acetaminophen  **OR** acetaminophen , ondansetron  **OR** ondansetron  (ZOFRAN ) IV, sodium chloride  flush, traMADol   Vital Signs  Vitals:   12/31/23 2300 01/01/24 0028 01/01/24 0422 01/01/24 0735  BP: (!) 157/88  (!) 155/94 (!) 171/98  Pulse: 99  93 99  Resp: 16  16 18   Temp: 97.6 F (36.4 C)  98.4 F (36.9 C) 98.2 F (36.8 C)  TempSrc: Oral  Oral Oral  SpO2: 97% 98% 95% 97%  Weight:      Height:        Intake/Output Summary (Last 24 hours) at 01/01/2024 0815 Last data filed at 01/01/2024 0425 Gross per 24 hour  Intake 769.2 ml  Output 300 ml  Net 469.2 ml      12/29/2023    7:01 PM 12/29/2023    1:48 PM  Last 3 Weights  Weight (lbs) 219 lb 3.2 oz 212 lb  Weight (kg) 99.428 kg 96.163 kg      Telemetry AFib 80's  - Personally Reviewed  ECG  No new EKGs - Personally Reviewed  Physical Exam  GEN: No acute distress.   Neck: No JVD Cardiac: irreg-irreg, no murmurs, rubs, or gallops.  Respiratory: Clear to auscultation bilaterally. GI: Soft, nontender, non-distended  MS: No edema; No deformity. Neuro:  Nonfocal  Psych: Normal affect   Labs High Sensitivity Troponin:   Recent Labs  Lab 12/29/23 1337 12/29/23 1634 12/30/23 1952  TROPONINIHS 7 9 11      Chemistry Recent Labs  Lab 12/29/23 1337 12/29/23 1342 12/30/23 1952 12/31/23 0447 01/01/24 0518  NA 139   < > 139 138 136  K 3.9   < > 4.2 3.9 3.4*  CL 102   < > 103 104 102  CO2 24   < > 27 26 27    GLUCOSE 132*   < > 135* 121* 94  BUN 16   < > 13 13 10   CREATININE 1.21*   < > 1.21* 1.10* 0.93  CALCIUM 8.6*   < > 8.5* 8.1* 8.1*  MG  --   --  1.9  --   --   PROT 6.6  --  6.5  --   --   ALBUMIN 3.7  --  3.4*  --   --   AST 30  --  30  --   --   ALT 14  --  15  --   --   ALKPHOS 85  --  75  --   --   BILITOT 0.9  --  1.4*  --   --   GFRNONAA 45*   < > 45* 50* >60  ANIONGAP 13   < > 9 8 7    < > = values in this interval not displayed.    Lipids No results for input(s): "CHOL", "TRIG", "HDL", "LABVLDL", "LDLCALC", "CHOLHDL" in the last 168 hours.  Hematology Recent Labs  Lab 12/30/23 0445 12/31/23 0447 01/01/24  0518  WBC 6.0 6.9 6.1  RBC 3.34*  3.35* 3.31* 3.30*  HGB 9.7* 9.8* 9.7*  HCT 30.6* 30.7* 30.7*  MCV 91.6 92.7 93.0  MCH 29.0 29.6 29.4  MCHC 31.7 31.9 31.6  RDW 14.5 14.4 14.3  PLT 174 163 140*   Thyroid  Recent Labs  Lab 12/29/23 1634  TSH 0.656    BNP Recent Labs  Lab 12/29/23 1337  BNP 182.3*    DDimer No results for input(s): "DDIMER" in the last 168 hours.   Radiology  DG Chest Port 1 View Result Date: 12/30/2023 CLINICAL DATA:  Acute respiratory distress. EXAM: PORTABLE CHEST 1 VIEW COMPARISON:  12/29/2023. FINDINGS: The heart is enlarged and the mediastinal contour stable. There is chronic elevation of right diaphragm. Lung volumes are low. No consolidation, effusion, or pneumothorax is seen. No acute osseous abnormality. IMPRESSION: Stable chest with no active disease. Electronically Signed   By: Wyvonnia Heimlich M.D.   On: 12/30/2023 18:51    DG Ankle 2 Views Left Result Date: 12/30/2023 CLINICAL DATA:  Left ankle, two views EXAM: LEFT ANKLE - 2 VIEW COMPARISON:  None Available. FINDINGS: No evidence of acute fracture or malalignment. Tibiotalar joint is symmetric. Artifact overlies the distal soft tissues. Moderate to large calcaneal spurring. IMPRESSION: 1. No evidence of acute fracture. 2. Calcaneal spurring. Electronically Signed   By: Reagan Camera M.D.   On: 12/30/2023 14:14   CT LUMBAR SPINE WO CONTRAST Result Date: 12/30/2023 EXAM: CT OF THE LUMBAR SPINE WITHOUT CONTRAST 12/30/2023 11:42:11 AM TECHNIQUE: CT of the lumbar spine was performed without the administration of intravenous contrast. Multiplanar reformatted images are provided for review. Automated exposure control, iterative reconstruction, and/or weight based adjustment of the mA/kV was utilized to reduce the radiation dose to as low as reasonably achievable. COMPARISON: Lumbar spine radiograph 01/08/2020 and lumbar spine MRI 07/20/2013. CLINICAL HISTORY: Low back pain, trauma. Back pain. FINDINGS: BONES AND ALIGNMENT: Conventional lumbosacral anatomy is assumed with 5 non-rib-bearing, lumbar-type vertebral bodies. Congenitally short pedicles throughout the lumbar spine. No acute fracture or suspicious bone lesion. Normal alignment. Ankylosis of the bilateral sacroiliac joints. DEGENERATIVE CHANGES: At L2-3, left eccentric disc bulge and left greater than right facet arthropathy result in at least mild central spinal canal stenosis with severe narrowing of the left subarticular zone and moderate bilateral neural foraminal narrowing. At L3-4, disc extrusion and facet arthropathy result in mild spinal canal stenosis and moderate left neural foraminal narrowing. At L4-5, disc bulge and moderate bilateral facet arthropathy result in moderate spinal canal stenosis with severe left and moderate right neural foraminal narrowing. At L5-S1, disc bulge and facet arthropathy result in severe bilateral neural foraminal narrowing. SOFT TISSUES: No acute abnormality. Atherosclerotic calcifications of the abdominal aorta and its branches. IMPRESSION: 1. No acute traumatic injury to the lumbar spine . 2. Multilevel lumbar spondylosis, worse than L4-5, where there is moderate spinal canal stenosis, severe left and moderate right neural foraminal narrowing. Electronically signed by: Audra Blend MD  12/30/2023 11:54 AM EDT RP Workstation: [id number]    Cardiac Studies   12/30/23: TTE  1. Left ventricular ejection fraction, by estimation, is 60 to 65%. The  left ventricle has normal function. The left ventricle has no regional  wall motion abnormalities. There is mild left ventricular hypertrophy.  Left ventricular diastolic parameters  are indeterminate.   2. Right ventricular systolic function was not well visualized. The right  ventricular size is not well visualized. There is moderately elevated  pulmonary artery  systolic pressure.   3. Left atrial size was moderately dilated.   4. The mitral valve is normal in structure. Trivial mitral valve  regurgitation. No evidence of mitral stenosis.   5. The tricuspid valve is abnormal. Tricuspid valve regurgitation is  moderate.   6. The aortic valve is tricuspid. There is mild calcification of the  aortic valve. There is mild thickening of the aortic valve. Aortic valve  regurgitation is not visualized. No aortic stenosis is present.   7. The inferior vena cava is dilated in size with >50% respiratory  variability, suggesting right atrial pressure of 8 mmHg.     Patient Profile   83 y.o. female w/PMHx of  Depression, HTN, CKD (IIIa) Syncope  Admitted via EMS after being found on the floor by family/reports of agonal breathning observed >> in the ER found in Afib Initial w/u unremarkable Here had a syncopal event on her way back from using the bathroom with HRs 20's   Assessment & Plan   Malignant vaso-vagal syncope Recommended PPM > planned for this afternoon  Preserved lVEF Narrow QRS Heparin  gtt off  NPO Orders in  Patient/family at bedside, remains agreeable for PPM > no foloow up qquestions this morning   2. AFib  CHA2DS2Vasc is 4 > new diagnosis for her Unknown duration No plans for DCCV this admission Heparin  gtt off for PPM Start OAC post pacing, pending pocket stability    Signed, Debbie Fails,  PA-C  01/01/2024, 8:15 AM

## 2024-01-01 NOTE — Progress Notes (Signed)
 PROGRESS NOTE Barbara Soto  EAV:[id number] DOB: January 22, 1941 DOA: 12/29/2023 PCP: Barbara Soto  Brief Narrative/Hospital Course: 82yof w/ history of depression, hypertension brought to the ED by EMS after found down at the bottom of 4 steps with face down in the dirt & body on concrete and was unconscious.  Unknown downtime and when EMS arrived and rolled her over she was still unconscious with agonal breathing, c-collar was applied.  On the way patient was vomiting violently, becoming more alert awake and on arrival alert awake oriented x 2. In the ED she reported not remembering following but reports "feeling funny" prior to the fall, no reported chest pain shortness of breath dizziness, focal weakness.  At baseline patient is ambulatory.  She had recent fall a month ago supposed to have a left ankle brace but unable to use due to brace rubbing on the skin and plan is for the cast after she gets her skin cancer removed which could not happen because her blood pressure was high.  She lives with her son.  On my exam she is alert awake and oriented x 3. In the ED: Vitals tachycardic with A-fib.  Other vital stable, on room air.  Labs showed creatinine 1.2 calcium 8.6 troponin 7, normal lactic acid, hemoglobin 10.7 INR 1.2. Underwent CT head and neck no acute finding mild chronic small vessel disease, CT chest abdomen pelvis> no acute traumatic injury, small right pleural effusion coronary artery calcification and colonic diverticulosis noted. Heparin  drip was started for A-fib since no evidence of internal bleeding, Cardizem  was ordered. Admission requested due to syncope and new onset A-fib with RVR  Subjective: Patient seen and examined this morning She was sleeping comfortably Able to wake up and interact Chest wall distended Overnight afebrile BP on higher side 171 heart rate 108 on nasal cannula Labs showed mild hypokalemia otherwise unchanged  Assessment and  plan:  Syncope: Suspected mild vasovagal syncope with long pauses in house up to 2-minute heart rate less than 20 needing brief chest compressions code activation 5/31 evening.  Episode of syncope at home PTA.  Cardiology planning for PPM implantation 6/2- hold heparin ..  Continue lidocaine patch and pain management for chest compression pain  New onset A-fib: TSH is stable echo EF 60-60% moderately dilated left atrium trivial MR moderate TR no aortic stenosis Heparin  on hold for pacemaker placement discussed with Barbara Soto 6/1  Hypertension: BP remains stable- higher side, due to syncope metropol discontinued.home meds have not been resumed    AKI on CKD3a Hypokalemia: Her last creatinine was 0.8 in 2022 in another chart- creat peaked at 1.3, improved w/ ivf.Encourage oral intake Replace potassium with IV KCl Recent Labs    12/29/23 1337 12/29/23 1342 12/30/23 0445 12/30/23 1952 12/31/23 0447 01/01/24 0518  BUN 16 18 14 13 13 10   CREATININE 1.21* 1.30* 1.08* 1.21* 1.10* 0.93  CO2 24  --  26 27 26 27   K 3.9 3.7 4.0 4.2 3.9 3.4*    Normocytic anemia: Normocytic likely chronic-anemia panel as below stable -Of note hemoglobin was normal 05/12/2021 but in 2091 as low as 6.4 g after hip replacement needing transfusion.  FOBT is negative hemoglobin holding stable above 9 , Likely hemodilution from ivf.  Continue B12 supplementation. Recent Labs    12/29/23 1337 12/29/23 1342 12/30/23 0445 12/31/23 0447 01/01/24 0518  HGB 10.7* 10.9* 9.7* 9.8* 9.7*  MCV 92.3  --  91.6 92.7 93.0  VITAMINB12  --   --  213  --   --   FOLATE  --   --  15.5  --   --   FERRITIN  --   --  26  --   --   TIBC  --   --  403  --   --   IRON  --   --  60  --   --   RETICCTPCT  --   --  1.1  --   --     Recent left ankle injury: Followed by orthopedics supposed to have cast placed after skin cancer removed from her left leg. Metta Actis lt ankle ok.  WBAT may be ok with brace.   Morbid Obesity w/ Body mass  index is 41.42 kg/m.:Will benefit with PCP follow-up, weight loss,healthy lifestyle and outpatient sleep eval if not done.  DVT prophylaxis: Place and maintain sequential compression device Start: 01/01/24 0705Heparin Code Status:   Code Status: Full Code Family Communication: plan of care discussed with patient/daughter not at bedside today. Patient status is: Remains hospitalized because of severity of illness Level of care: Telemetry Cardiac   Dispo: The patient is from: home            Anticipated disposition: TBD Objective: Vitals last 24 hrs: Vitals:   12/31/23 2300 01/01/24 0028 01/01/24 0422 01/01/24 0735  BP: (!) 157/88  (!) 155/94 (!) 171/98  Pulse: 99  93 99  Resp: 16  16 18   Temp: 97.6 F (36.4 C)  98.4 F (36.9 C) 98.2 F (36.8 C)  TempSrc: Oral  Oral Oral  SpO2: 97% 98% 95% 97%  Weight:      Height:        Physical Examination: General exam: alert awake, oriented  HEENT:Oral mucosa moist, Ear/Nose WNL grossly Respiratory system: Bilaterally clear BS,no use of accessory muscle-chest wall tender to touch Cardiovascular system: S1 & S2 +, No JVD. Gastrointestinal system: Abdomen soft,NT,ND, BS+ Nervous System: Alert, awake, moving all extremities,and following commands. Extremities: LE edema neg,distal peripheral pulses palpable and warm.  Skin: No rashes,no icterus. MSK: Normal muscle bulk,tone, power    Data Reviewed: I have personally reviewed following labs and imaging studies ( see epic result tab) CBC: Recent Labs  Lab 12/29/23 1337 12/29/23 1342 12/30/23 0445 12/31/23 0447 01/01/24 0518  WBC 5.4  --  6.0 6.9 6.1  HGB 10.7* 10.9* 9.7* 9.8* 9.7*  HCT 33.6* 32.0* 30.6* 30.7* 30.7*  MCV 92.3  --  91.6 92.7 93.0  PLT 181  --  174 163 140*   CMP: Recent Labs  Lab 12/29/23 1337 12/29/23 1342 12/30/23 0445 12/30/23 1952 12/31/23 0447 01/01/24 0518  NA 139 139 140 139 138 136  K 3.9 3.7 4.0 4.2 3.9 3.4*  CL 102 102 103 103 104 102  CO2 24   --  26 27 26 27   GLUCOSE 132* 132* 103* 135* 121* 94  BUN 16 18 14 13 13 10   CREATININE 1.21* 1.30* 1.08* 1.21* 1.10* 0.93  CALCIUM 8.6*  --  8.5* 8.5* 8.1* 8.1*  MG  --   --   --  1.9  --   --    GFR: Estimated Creatinine Clearance: 51.2 mL/min (by C-G formula based on SCr of 0.93 mg/dL). Recent Labs  Lab 12/29/23 1337 12/30/23 1952  AST 30 30  ALT 14 15  ALKPHOS 85 75  BILITOT 0.9 1.4*  PROT 6.6 6.5  ALBUMIN 3.7 3.4*   No results for input(s): "LIPASE", "AMYLASE" in the last 168  hours. No results for input(s): "AMMONIA" in the last 168 hours. Coagulation Profile:  Recent Labs  Lab 12/29/23 1337  INR 1.2   Unresulted Labs (From admission, onward)     Start     Ordered   01/02/24 0500  Heparin  level (unfractionated)  Daily,   R     Question:  Specimen collection method  Answer:  Lab=Lab collect   12/31/23 2153   12/30/23 0500  Basic metabolic panel with GFR  Daily,   R      12/29/23 1718   12/30/23 0500  CBC  Daily,   R      12/29/23 1718   12/29/23 1743  Urinalysis, Routine w reflex microscopic -Urine, Clean Catch  Once,   R       Question:  Specimen Source  Answer:  Urine, Clean Catch   12/29/23 1742           Antimicrobials/Microbiology: Anti-infectives (From admission, onward)    Start     Dose/Rate Route Frequency Ordered Stop   12/31/23 0930  gentamicin (GARAMYCIN) 80 mg in sodium chloride  0.9 % 500 mL irrigation        80 mg Irrigation On call 12/31/23 0839 01/01/24 0930   12/31/23 0930  ceFAZolin (ANCEF) IVPB 2g/100 mL premix        2 g 200 mL/hr over 30 Minutes Intravenous On call 12/31/23 0839 01/01/24 0930      No results found for: "SDES", "SPECREQUEST", "CULT", "REPTSTATUS"  Procedures: Procedure(s) (LRB): PACEMAKER IMPLANT (N/A) Medications reviewed:  Scheduled Meds:  lidocaine  1 patch Transdermal Q24H   sertraline  25 mg Oral Daily   sodium chloride  flush  3-10 mL Intravenous Q12H   vitamin B-12  500 mcg Oral Daily   Continuous  Infusions:   Lesa Rape, MD [facility] 01/01/2024, 10:08 AM

## 2024-01-01 NOTE — TOC CM/SW Note (Signed)
 Transition of Care [facility]) - Inpatient Brief Assessment   Patient Details  Name: Barbara Soto MRN: [id number] Date of Birth: 05-22-1941  [facility] of [facility]) CM/SW Contact:    Juliane Och, LCSW Phone Number: 01/01/2024, 11:45 AM   Clinical Narrative:  11:45 AM Per chart review, patient has a PCP and insurance. Patient does not have SNF/HH/DME history. Patient and patient's family declined CSW offer of SDOH (housing) resources. No TOC needs were identified at this time. TOC will continue to follow and be available to assist.  Transition of Care Asessment: Insurance and Status: Insurance coverage has been reviewed Patient has primary care physician: Yes Home environment has been reviewed: Private Residence Prior level of function:: N/A Prior/Current Home Services: No current home services Social Drivers of Health Review: SDOH reviewed no interventions necessary Readmission risk has been reviewed: Yes Transition of care needs: no transition of care needs at this time

## 2024-01-01 NOTE — Progress Notes (Signed)
 PT Cancellation Note  Patient Details Name: Barbara Soto MRN: [id number] DOB: 1940-10-06   Cancelled Treatment:    Reason Eval/Treat Not Completed: Patient not medically ready Pt to have PPM placed today. RN request PT hold off until after procedure.  Leviathan Macera B. Jewel Mortimer PT, DPT Acute Rehabilitation Services Please use secure chat or  Call Office [phone]    Verlie Glisson Fleet 01/01/2024, 10:10 AM

## 2024-01-01 NOTE — Progress Notes (Signed)
 PCCM Progress Note   Chart reviewed and it appears there is a tentative plan to place PPM this evening, no further critical care needs seen. PCCM will sign off. Thank you for the opportunity to participate in this patient's care. Please contact if we can be of further assistance.   Barbara Sangiovanni D. Harris, NP-C [facility] Personal contact information can be found on Amion  If no contact or response made please call [phone] 01/01/2024, 7:22 AM

## 2024-01-02 ENCOUNTER — Telehealth (HOSPITAL_COMMUNITY): Payer: Self-pay | Admitting: Internal Medicine

## 2024-01-02 ENCOUNTER — Encounter (HOSPITAL_COMMUNITY): Payer: Self-pay | Admitting: Cardiology

## 2024-01-02 ENCOUNTER — Inpatient Hospital Stay (HOSPITAL_COMMUNITY)

## 2024-01-02 ENCOUNTER — Other Ambulatory Visit (HOSPITAL_COMMUNITY): Payer: Self-pay

## 2024-01-02 DIAGNOSIS — Z23 Encounter for immunization: Secondary | ICD-10-CM | POA: Diagnosis not present

## 2024-01-02 DIAGNOSIS — I469 Cardiac arrest, cause unspecified: Secondary | ICD-10-CM | POA: Diagnosis not present

## 2024-01-02 DIAGNOSIS — I4891 Unspecified atrial fibrillation: Secondary | ICD-10-CM | POA: Diagnosis not present

## 2024-01-02 DIAGNOSIS — Z66 Do not resuscitate: Secondary | ICD-10-CM | POA: Diagnosis not present

## 2024-01-02 DIAGNOSIS — R55 Syncope and collapse: Secondary | ICD-10-CM | POA: Diagnosis not present

## 2024-01-02 LAB — BASIC METABOLIC PANEL WITH GFR
Anion gap: 6 (ref 5–15)
BUN: 14 mg/dL (ref 8–23)
CO2: 29 mmol/L (ref 22–32)
Calcium: 8.5 mg/dL — ABNORMAL LOW (ref 8.9–10.3)
Chloride: 103 mmol/L (ref 98–111)
Creatinine, Ser: 0.86 mg/dL (ref 0.44–1.00)
GFR, Estimated: >60 mL/min (ref >60)
Glucose, Bld: 95 mg/dL (ref 70–99)
Potassium: 3.7 mmol/L (ref 3.5–5.1)
Sodium: 138 mmol/L (ref 135–145)

## 2024-01-02 LAB — CBC
HCT: 29.9 % — ABNORMAL LOW (ref 36.0–46.0)
Hemoglobin: 9.9 g/dL — ABNORMAL LOW (ref 12.0–15.0)
MCH: 30.3 pg (ref 26.0–34.0)
MCHC: 33.1 g/dL (ref 30.0–36.0)
MCV: 91.4 fL (ref 80.0–100.0)
Platelets: 141 10*3/uL — ABNORMAL LOW (ref 150–400)
RBC: 3.27 MIL/uL — ABNORMAL LOW (ref 3.87–5.11)
RDW: 14.1 % (ref 11.5–15.5)
WBC: 6.2 10*3/uL (ref 4.0–10.5)
nRBC: 0 % (ref 0.0–0.2)

## 2024-01-02 MED ORDER — METOPROLOL SUCCINATE ER 25 MG PO TB24
25.0000 mg | ORAL_TABLET | Freq: Two times a day (BID) | ORAL | Status: DC
  Administered 2024-01-02 – 2024-01-04 (×5): 25 mg via ORAL
  Filled 2024-01-02 (×5): qty 1

## 2024-01-02 MED ORDER — HYDRALAZINE HCL 20 MG/ML IJ SOLN: 5.0000 mg | Freq: Four times a day (QID) | INTRAMUSCULAR | Status: DC | PRN

## 2024-01-02 MED ORDER — METOPROLOL SUCCINATE ER 25 MG PO TB24
25.0000 mg | ORAL_TABLET | Freq: Two times a day (BID) | ORAL | 0 refills | Status: DC
Start: 2024-01-02 — End: 2024-02-07
  Filled 2024-01-02: qty 60, 30d supply, fill #0

## 2024-01-02 MED ORDER — AMLODIPINE BESYLATE 5 MG PO TABS
5.0000 mg | ORAL_TABLET | Freq: Every day | ORAL | Status: DC
  Administered 2024-01-02 – 2024-01-04 (×3): 5 mg via ORAL
  Filled 2024-01-02 (×3): qty 1

## 2024-01-02 MED ORDER — APIXABAN 5 MG PO TABS
5.0000 mg | ORAL_TABLET | Freq: Two times a day (BID) | ORAL | 0 refills | Status: DC
  Filled 2024-01-02: qty 60, 30d supply, fill #0

## 2024-01-02 MED ORDER — SODIUM CHLORIDE 0.9 % IV SOLN: INTRAVENOUS | Status: AC

## 2024-01-02 NOTE — Progress Notes (Signed)
 At approximately 12:40, entered room with Juluis Ok, NT to assist patient to dress for discharge home.  Patient found in recliner appearing at rest.  Attempted to rouse patient and noted acute altered mental status with inability to speak, blank/absent staring and agonal breathing; not on telemetry at time of incident due to discharge.  Multiple RNs responded to room.  BP was markedly elevated and HR 110-120 in atrial fibrillation.  At approximately 1 minute, patient had been started on O2 at 4L/m via nasal canula, was breathing more normally and was beginning to verbally respond appropriately.    Telemetry leads were replaced and EKG performed.  Notified rapid response RN, Alisa App, Dr. Lurlene Salon and Mertha Abrahams, PA-C who all responded to the patient's bedside also.  PPM checked by technologist for appropriate function; no problems were noted.  Orders received; cancel discharge, bolus IVF and BP medications to be completed.  Family notified in person at bedside.  Will continue to closely monitor.

## 2024-01-02 NOTE — Discharge Instructions (Addendum)
 After Your Pacemaker   You have a Biotronik Pacemaker  ACTIVITY Do not lift your arm above shoulder height for 1 week after your procedure. After 7 days, you may progress as below.  You should remove your sling 24 hours after your procedure, unless otherwise instructed by your provider.     Tuesday January 09, 2024  Wednesday January 10, 2024 Thursday January 11, 2024 Friday January 12, 2024   Do not lift, push, pull, or carry anything over 10 pounds with the affected arm until 6 weeks (Tuesday February 13, 2024 ) after your procedure.   You may drive AFTER your wound check, unless you have been told otherwise by your provider.   Ask your healthcare provider when you can go back to work   INCISION/Dressing If you are on a blood thinner such as Coumadin, Xarelto, Eliquis, Plavix, or Pradaxa please confirm with your provider when this should be resumed. OK to start on 01/07/27  If large square, outer bandage is left in place, this can be removed after 24 hours from your procedure. Do not remove steri-strips or glue as below.   If a PRESSURE DRESSING (a bulky dressing that usually goes up over your shoulder) was applied or left in place, please follow instructions given by your provider on when to return to have this removed.   Monitor your Pacemaker site for redness, swelling, and drainage. Call the device clinic at [phone] if you experience these symptoms or fever/chills.  If your incision is sealed with Steri-strips or staples, you may shower 7 days after your procedure or when told by your provider. Do not remove the steri-strips or let the shower hit directly on your site. You may wash around your site with soap and water.    If you were discharged in a sling, please do not wear this during the day more than 48 hours after your surgery unless otherwise instructed. This may increase the risk of stiffness and soreness in your shoulder.   Avoid lotions, ointments, or perfumes over your incision  until it is well-healed.  You may use a hot tub or a pool AFTER your wound check appointment if the incision is completely closed.  Pacemaker Alerts:  Some alerts are vibratory and others beep. These are NOT emergencies. Please call our office to let us  know. If this occurs at night or on weekends, it can wait until the next business day. Send a remote transmission.  If your device is capable of reading fluid status (for heart failure), you will be offered monthly monitoring to review this with you.   DEVICE MANAGEMENT Remote monitoring is used to monitor your pacemaker from home. This monitoring is scheduled every 91 days by our office. It allows us  to keep an eye on the functioning of your device to ensure it is working properly. You will routinely see your Electrophysiologist annually (more often if necessary).   You should receive your ID card for your new device in 4-8 weeks. Keep this card with you at all times once received. Consider wearing a medical alert bracelet or necklace.  Your Pacemaker may be MRI compatible. This will be discussed at your next office visit/wound check.  You should avoid contact with strong electric or magnetic fields.   Do not use amateur (ham) radio equipment or electric (arc) welding torches. MP3 player headphones with magnets should not be used. Some devices are safe to use if held at least 12 inches (30 cm) from your Pacemaker. These  include power tools, lawn mowers, and speakers. If you are unsure if something is safe to use, ask your health care provider.  When using your cell phone, hold it to the ear that is on the opposite side from the Pacemaker. Do not leave your cell phone in a pocket over the Pacemaker.  You may safely use electric blankets, heating pads, computers, and microwave ovens.  Call the office right away if: You have chest pain. You feel more short of breath than you have felt before. You feel more light-headed than you have felt  before. Your incision starts to open up.  This information is not intended to replace advice given to you by your health care provider. Make sure you discuss any questions you have with your health care provider.   -----------------------------------------------------------------------------------------  Information on my medicine - ELIQUIS (apixaban)  Why was Eliquis prescribed for you? Eliquis was prescribed for you to reduce the risk of a blood clot forming that can cause a stroke if you have a medical condition called atrial fibrillation (a type of irregular heartbeat).  What do You need to know about Eliquis ? Take your Eliquis TWICE DAILY - one tablet in the morning and one tablet in the evening with or without food. If you have difficulty swallowing the tablet whole please discuss with your pharmacist how to take the medication safely.  Take Eliquis exactly as prescribed by your doctor and DO NOT stop taking Eliquis without talking to the doctor who prescribed the medication.  Stopping may increase your risk of developing a stroke.  Refill your prescription before you run out.  After discharge, you should have regular check-up appointments with your healthcare provider that is prescribing your Eliquis.  In the future your dose may need to be changed if your kidney function or weight changes by a significant amount or as you get older.  What do you do if you miss a dose? If you miss a dose, take it as soon as you remember on the same day and resume taking twice daily.  Do not take more than one dose of ELIQUIS at the same time to make up a missed dose.  Important Safety Information A possible side effect of Eliquis is bleeding. You should call your healthcare provider right away if you experience any of the following: Bleeding from an injury or your nose that does not stop. Unusual colored urine (red or dark brown) or unusual colored stools (red or black). Unusual bruising  for unknown reasons. A serious fall or if you hit your head (even if there is no bleeding).  Some medicines may interact with Eliquis and might increase your risk of bleeding or clotting while on Eliquis. To help avoid this, consult your healthcare provider or pharmacist prior to using any new prescription or non-prescription medications, including herbals, vitamins, non-steroidal anti-inflammatory drugs (NSAIDs) and supplements.  This website has more information on Eliquis (apixaban): http://www.eliquis.com/eliquis/home

## 2024-01-02 NOTE — Plan of Care (Signed)
  Problem: Education: Goal: Knowledge of cardiac device and self-care will improve Outcome: Progressing Goal: Ability to safely manage health related needs after discharge will improve Outcome: Progressing

## 2024-01-02 NOTE — Progress Notes (Addendum)
 PROGRESS NOTE Barbara Soto  NFA:[id number] DOB: Nov 17, 1940 DOA: 12/29/2023 PCP: Windell Hasty, DO  Brief Narrative/Hospital Course: [phone] w/ history of depression, hypertension brought to the ED by EMS after found down at the bottom of 4 steps with face down in the dirt & body on concrete and was unconscious.  Unknown downtime and when EMS arrived and rolled her over she was still unconscious with agonal breathing, c-collar was applied.  On the way patient was vomiting violently, becoming more alert awake and on arrival alert awake oriented x 2. In the ED she reported not remembering following but reports "feeling funny" prior to the fall, no reported chest pain shortness of breath dizziness, focal weakness.  At baseline patient is ambulatory.  She had recent fall a month ago supposed to have a left ankle brace but unable to use due to brace rubbing on the skin and plan is for the cast after she gets her skin cancer removed which could not happen because her blood pressure was high.  She lives with her son.  On my exam she is alert awake and oriented x 3. In the ED: Vitals tachycardic with A-fib.  Other vital stable, on room air.  Labs showed creatinine 1.2 calcium 8.6 troponin 7, normal lactic acid, hemoglobin 10.7 INR 1.2. Underwent CT head and neck no acute finding mild chronic small vessel disease, CT chest abdomen pelvis> no acute traumatic injury, small right pleural effusion coronary artery calcification and colonic diverticulosis noted. Heparin  drip was started for A-fib since no evidence of internal bleeding, Cardizem  was ordered. Admission requested due to syncope and new onset A-fib with RVR  Subjective: Seen and examined this morning About to work with PT OT Got pacemaker placed last night Overnight remains hemodynamically stable afebrile on nasal cannula oxygen Labs with stable potassium CBC with mild thrombocytopenia and anemia  Assessment and plan:  Malignant vasovagal  syncope Cardiac arrest with pause in house: Suspected mild vasovagal syncope with long pauses in house up to 2-minute heart rate less than 20 needing brief chest compressions code activation 5/31 evening.  Episode of syncope at home PTA. S/p PPM implantation 6/2- held heparin  perop. Continue lidocaine patch and pain management for chest compression pain Repeat syncope episode at 12:45pm-having nausesae prior to event>BP high in 189s psot evetn likley reflexly event lasting for a minute or so- she has no recollection no post ictal confusion, aao, non focal on exam EP cardio also arrived- will do PM interrogation, hold discharge today. Since BP high resume amlodipine. Hopefully resume other bp meds- Check orthostatics. Will add ivf. Cont prn zofran . Now back on tele.  New onset A-fib: TSH is stable echo EF 60-60% moderately dilated left atrium trivial MR moderate TR no aortic stenosis Heparin  prepacemaker 6/2, currently remains on hold await cardiology plan recommendation for DOAC Metropol discontinued due to bradycardia/pauses  Hypertension: BP remains on higher side today resuming more meds   AKI on CKD3a Hypokalemia: Her last creatinine was 0.8 in 2022 in another chart- creat peaked at 1.3 has improved since two 0.8.  Potassium improved Recent Labs    12/29/23 1337 12/29/23 1342 12/30/23 0445 12/30/23 1952 12/31/23 0447 01/01/24 0518 01/02/24 0547  BUN 16 18 14 13 13 10 14   CREATININE 1.21* 1.30* 1.08* 1.21* 1.10* 0.93 0.86  CO2 24  --  26 27 26 27 29   K 3.9 3.7 4.0 4.2 3.9 3.4* 3.7    Normocytic anemia: Normocytic likely chronic-anemia panel as below stable -Of note  hemoglobin was normal 05/12/2021 but in 2091 as low as 6.4 g after hip replacement needing transfusion.  FOBT is negative- anemia drop Likely hemodilution from ivf.Continue B12 supplementation.  Overall remains stable Recent Labs    12/29/23 1342 12/30/23 0445 12/31/23 0447 01/01/24 0518 01/02/24 0547  HGB  10.9* 9.7* 9.8* 9.7* 9.9*  MCV  --  91.6 92.7 93.0 91.4  VITAMINB12  --  213  --   --   --   FOLATE  --  15.5  --   --   --   FERRITIN  --  26  --   --   --   TIBC  --  403  --   --   --   IRON  --  60  --   --   --   RETICCTPCT  --  1.1  --   --   --     Recent left ankle injury: Followed by orthopedics supposed to have cast placed after skin cancer removed from her left leg. Metta Actis lt ankle ok.  WBAT may be ok with brace.   Morbid Obesity w/ Body mass index is 41.42 kg/m.:Will benefit with PCP follow-up, weight loss,healthy lifestyle and outpatient sleep eval if not done.   DVT prophylaxis: Place and maintain sequential compression device Start: 01/01/24 0705Heparin Code Status:   Code Status: Full Code Family Communication: plan of care discussed with patient/daughter not at bedside today. Patient status is: Remains hospitalized because of severity of illness Level of care: Telemetry Cardiac   Dispo: The patient is from: home            Anticipated disposition: TBD Objective: Vitals last 24 hrs: Vitals:   01/01/24 2225 01/02/24 0048 01/02/24 0458 01/02/24 0735  BP: 133/81 (!) 155/95  (!) 144/106  Pulse:  99  67  Resp:  20 19 18   Temp:  98.4 F (36.9 C) 98.3 F (36.8 C) 97.9 F (36.6 C)  TempSrc:  Oral Oral Oral  SpO2:  93%  99%  Weight:      Height:        Physical Examination: General exam: alert awake, oriented  HEENT:Oral mucosa moist, Ear/Nose WNL grossly Respiratory system: Bilaterally clear BS,no use of accessory muscle Cardiovascular system: S1 & S2 +, No JVD. Gastrointestinal system: Abdomen soft,NT,ND, BS+ Nervous System: Alert, awake, moving all extremities,and following commands. Extremities: LE edema neg,distal peripheral pulses palpable and warm.  Skin: No rashes,no icterus.  Left pacemaker site with dressing in place. MSK: Normal muscle bulk,tone, power   Data Reviewed: I have personally reviewed following labs and imaging studies ( see epic result  tab) CBC: Recent Labs  Lab 12/29/23 1337 12/29/23 1342 12/30/23 0445 12/31/23 0447 01/01/24 0518 01/02/24 0547  WBC 5.4  --  6.0 6.9 6.1 6.2  HGB 10.7* 10.9* 9.7* 9.8* 9.7* 9.9*  HCT 33.6* 32.0* 30.6* 30.7* 30.7* 29.9*  MCV 92.3  --  91.6 92.7 93.0 91.4  PLT 181  --  174 163 140* 141*   CMP: Recent Labs  Lab 12/30/23 0445 12/30/23 1952 12/31/23 0447 01/01/24 0518 01/02/24 0547  NA 140 139 138 136 138  K 4.0 4.2 3.9 3.4* 3.7  CL 103 103 104 102 103  CO2 26 27 26 27 29   GLUCOSE 103* 135* 121* 94 95  BUN 14 13 13 10 14   CREATININE 1.08* 1.21* 1.10* 0.93 0.86  CALCIUM 8.5* 8.5* 8.1* 8.1* 8.5*  MG  --  1.9  --   --   --  GFR: Estimated Creatinine Clearance: 55.4 mL/min (by C-G formula based on SCr of 0.86 mg/dL). Recent Labs  Lab 12/29/23 1337 12/30/23 1952  AST 30 30  ALT 14 15  ALKPHOS 85 75  BILITOT 0.9 1.4*  PROT 6.6 6.5  ALBUMIN 3.7 3.4*   No results for input(s): "LIPASE", "AMYLASE" in the last 168 hours. No results for input(s): "AMMONIA" in the last 168 hours. Coagulation Profile:  Recent Labs  Lab 12/29/23 1337  INR 1.2   Unresulted Labs (From admission, onward)     Start     Ordered   12/30/23 0500  Basic metabolic panel with GFR  Daily,   R      12/29/23 1718   12/30/23 0500  CBC  Daily,   R      12/29/23 1718   12/29/23 1743  Urinalysis, Routine w reflex microscopic -Urine, Clean Catch  Once,   R       Question:  Specimen Source  Answer:  Urine, Clean Catch   12/29/23 1742           Antimicrobials/Microbiology: Anti-infectives (From admission, onward)    Start     Dose/Rate Route Frequency Ordered Stop   01/01/24 1754  sodium chloride  0.9 % with gentamicin (GARAMYCIN) ADS Med       Note to Pharmacy: Jacinto Martins A: cabinet override      01/01/24 1754 01/02/24 0559   12/31/23 0930  gentamicin (GARAMYCIN) 80 mg in sodium chloride  0.9 % 500 mL irrigation        80 mg Irrigation On call 12/31/23 0839 01/01/24 0930   12/31/23 0930   ceFAZolin (ANCEF) IVPB 2g/100 mL premix        2 g 200 mL/hr over 30 Minutes Intravenous On call 12/31/23 0839 01/01/24 1856      No results found for: "SDES", "SPECREQUEST", "CULT", "REPTSTATUS"  Procedures: Procedure(s) (LRB): PACEMAKER IMPLANT (N/A) Medications reviewed:  Scheduled Meds:  acetaminophen   1,000 mg Oral TID   lidocaine  1 patch Transdermal Q24H   metoprolol  succinate  25 mg Oral BID   sertraline  25 mg Oral Daily   vitamin B-12  500 mcg Oral Daily  Continuous Infusions:  Lesa Rape, MD [facility] 01/02/2024, 11:04 AM

## 2024-01-02 NOTE — Progress Notes (Addendum)
 Called to bedside with recurrent syncopal event Patient was discharged, off telemetry, seated  Reported to have a blank stare, "guppy-like" breathing, RN reports she had palpable pulse "thready" BP obtained still seated and coming around but nauseous and retching 160s/110  Pt reports that she had gotten nauseous but denied vomiting retching prior to the event, was waiting to be wheeled downstairs to go home Pt is in AFib 100's- 110's, hypertensive 170's/90's She is AAO x4, IM MD at bedside as well without focal deficits appreciated  She remains nauseous, retching but not vomiting  PPM interrogation with stable measurements RA: P wave in AF 1.1, imp 469 RV sensing is 7.8 (7.2 this morning), threshold 0.7/0.4 (0.7/0.4), imp 566 (566) RV pacing 1% No HVR episodes noted Pacer function is intact and stable, no findings to suggest lack of pacing support  D/w Dr. Marven Slimmer, no new recommendations from EP perspective Perhaps with reports of preceding nausea > presumed vagal/hypotensive episode  Discharge cancelled Will defer management to IM team her nausea, rib pain, HTN. Any other further w/u felt indicated (in our discussion he had low suspicion of neuro/seizure event)  Mertha Abrahams, PA-C

## 2024-01-02 NOTE — TOC Initial Note (Signed)
 Transition of Care [facility]) - Initial/Assessment Note    Patient Details  Name: Barbara Soto MRN: [id number] Date of Birth: 1941/02/16  Transition [facility]) CM/SW Contact:    Cosimo Diones, RN Phone Number: 01/02/2024, 1:13 PM  Clinical Narrative:  Patient presented for   new onset atrial fib-post pacemaker. PTA patient was from home alone. Patient has support of daughter. Patient states she has DME rolling walker and cane. Case Manager discussed home health services and patient asked Case Manager to discuss with daughter. Case Manager has a call out to the daughter regarding disposition needs and agency choice. Patient had a syncopal episode; discharge was cancelled. Case Manager will continue to follow for additional transition of care needs as the patient progresses.                Expected Discharge Plan: Home w Home Health Services Barriers to Discharge: Continued Medical Work up   Patient Goals and CMS Choice Patient states their goals for this hospitalization and ongoing recovery are:: patient wants to return home.   Choice offered to / list presented to : Adult Children      Expected Discharge Plan and Services In-house Referral: NA Discharge Planning Services: CM Consult Post Acute Care Choice: Home Health Living arrangements for the past 2 months: Single Family Home Expected Discharge Date: 01/02/24                         HH Arranged: RN, Disease Management, PT, OT          Prior Living Arrangements/Services Living arrangements for the past 2 months: Single Family Home Lives with:: Self Patient language and need for interpreter reviewed:: Yes Do you feel safe going back to the place where you live?: Yes      Need for Family Participation in Patient Care: Yes (Comment) Care giver support system in place?: Yes (comment) Current home services: DME (cane and rolling walker.) Criminal Activity/Legal Involvement Pertinent to Current  Situation/Hospitalization: No - Comment as needed  Activities of Daily Living      Permission Sought/Granted Permission sought to share information with : Family Supports, Case Manager                Emotional Assessment Appearance:: Appears stated age Attitude/Demeanor/Rapport: Engaged Affect (typically observed): Appropriate Orientation: : Oriented to Self, Oriented to Place, Oriented to  Time, Oriented to Situation Alcohol / Substance Use: Not Applicable Psych Involvement: No (comment)  Admission diagnosis:  New onset atrial fibrillation (HCC) [I48.91] Hematoma of scalp, initial encounter [S00.03XA] Syncope, unspecified syncope type [R55] Patient Active Problem List   Diagnosis Date Noted   New onset atrial fibrillation (HCC) 12/29/2023   PCP:  Windell Hasty, DO Pharmacy:   [facility] [phone] Jonette Nestle, [place] [address] [place] [address] Phone: [phone] Fax: [phone]  [facility] [address] [place] [address] Phone: [phone] Fax: [phone]     Social Drivers of Health (SDOH) Social History: SDOH Screenings   Food Insecurity: No Food Insecurity (12/31/2023)  Housing: High Risk (12/31/2023)  Transportation Needs: No Transportation Needs (12/31/2023)  Utilities: Not At Risk (12/31/2023)  Social Connections: Unknown (12/31/2023)   SDOH Interventions: Housing Interventions: Patient Declined   Readmission Risk Interventions     No data to display

## 2024-01-02 NOTE — Plan of Care (Signed)
  Problem: Education: Goal: Knowledge of General Education information will improve Description: Including pain rating scale, medication(s)/side effects and non-pharmacologic comfort measures Outcome: Progressing   Problem: Health Behavior/Discharge Planning: Goal: Ability to manage health-related needs will improve Outcome: Progressing   Problem: Clinical Measurements: Goal: Ability to maintain clinical measurements within normal limits will improve Outcome: Progressing Goal: Will remain free from infection Outcome: Progressing Goal: Diagnostic test results will improve Outcome: Progressing Goal: Respiratory complications will improve Outcome: Progressing Goal: Cardiovascular complication will be avoided Outcome: Progressing   Problem: Activity: Goal: Risk for activity intolerance will decrease Outcome: Progressing   Problem: Nutrition: Goal: Adequate nutrition will be maintained Outcome: Progressing   Problem: Coping: Goal: Level of anxiety will decrease Outcome: Progressing   Problem: Elimination: Goal: Will not experience complications related to bowel motility Outcome: Progressing Goal: Will not experience complications related to urinary retention Outcome: Progressing   Problem: Pain Managment: Goal: General experience of comfort will improve and/or be controlled Outcome: Progressing   Problem: Safety: Goal: Ability to remain free from injury will improve Outcome: Progressing   Problem: Skin Integrity: Goal: Risk for impaired skin integrity will decrease Outcome: Progressing   Problem: Education: Goal: Knowledge of disease or condition will improve Outcome: Progressing Goal: Understanding of medication regimen will improve Outcome: Progressing Goal: Individualized Educational Video(s) Outcome: Progressing   Problem: Activity: Goal: Ability to tolerate increased activity will improve Outcome: Progressing   Problem: Cardiac: Goal: Ability to achieve  and maintain adequate cardiopulmonary perfusion will improve Outcome: Progressing   Problem: Health Behavior/Discharge Planning: Goal: Ability to safely manage health-related needs after discharge will improve Outcome: Progressing   Problem: Education: Goal: Knowledge of cardiac device and self-care will improve Outcome: Progressing Goal: Ability to safely manage health related needs after discharge will improve Outcome: Progressing Goal: Individualized Educational Video(s) Outcome: Progressing   Problem: Cardiac: Goal: Ability to achieve and maintain adequate cardiopulmonary perfusion will improve Outcome: Progressing

## 2024-01-02 NOTE — Discharge Summary (Deleted)
 Physician Discharge Summary  Barbara Soto:[id number] DOB: 20-Jul-1941 DOA: 12/29/2023  PCP: Windell Hasty, DO  Admit date: 12/29/2023 Discharge date: 01/02/2024 Recommendations for Outpatient Follow-up:  Follow up with PCP in 1 weeks-call for appointment Please obtain BMP/CBC in one week] To start eliquis from 01/07/24  Discharge Dispo: home /w hh Discharge Condition: Stable Code Status:   Code Status: Full Code Diet recommendation:  Diet Order             Diet Heart Room service appropriate? Yes; Fluid consistency: Thin  Diet effective now                    Brief/Interim Summary: 82yof w/ history of depression, hypertension brought to the ED by EMS after found down at the bottom of 4 steps with face down in the dirt & body on concrete and was unconscious.  Unknown downtime and when EMS arrived and rolled her over she was still unconscious with agonal breathing, c-collar was applied.  On the way patient was vomiting violently, becoming more alert awake and on arrival alert awake oriented x 2. In the ED she reported not remembering following but reports "feeling funny" prior to the fall, no reported chest pain shortness of breath dizziness, focal weakness.  At baseline patient is ambulatory.  She had recent fall a month ago supposed to have a left ankle brace but unable to use due to brace rubbing on the skin and plan is for the cast after she gets her skin cancer removed which could not happen because her blood pressure was high.  She lives with her son.  On my exam she is alert awake and oriented x 3. In the ED: Vitals tachycardic with A-fib.  Other vital stable, on room air.  Labs showed creatinine 1.2 calcium 8.6 troponin 7, normal lactic acid, hemoglobin 10.7 INR 1.2. Underwent CT head and neck no acute finding mild chronic small vessel disease, CT chest abdomen pelvis> no acute traumatic injury, small right pleural effusion coronary artery calcification and colonic diverticulosis  noted. Heparin  drip was started for A-fib since no evidence of internal bleeding, Cardizem  was ordered. 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  Did well with PT OT and eager to go home Cardiology advised okay for discharge with starting OAC on 6/8  Subjective: Seen and examined this morning About to work with PT OT Got pacemaker placed last night Overnight remains hemodynamically stable afebrile on nasal cannula oxygen Labs with stable potassium CBC with mild thrombocytopenia and anemia  Discharge diagnosis   Malignant vasovagal syncope Cardiac arrest with pause in house: Suspected mild vasovagal syncope with long pauses in house up to 2-minute heart rate less than 20 needing brief chest compressions code activation 5/31 evening.  Episode of syncope at home PTA. S/p PPM implantation 6/2- held heparin  perop. Continue lidocaine patch and pain management for chest compression pain  New onset A-fib: TSH is stable echo EF 60-60% moderately dilated left atrium trivial MR moderate TR no aortic stenosis Heparin  prepacemaker 6/2, currently remains on hold await cardiology plan recommendation for DOAC Metropol discontinued due to bradycardia/pauses  Cardiology message that patient can be discharged> and Eliquis can be started from 01/07/2024  Hypertension: BP remains stable- higher side, due to syncope metropol discontinued.home meds  have not been resumed    AKI on CKD3a Hypokalemia: Her last creatinine was 0.8 in 2022 in another chart- creat peaked at 1.3 has improved since two 0.8.  Potassium improved Recent Labs    12/29/23 1337 12/29/23 1342 12/30/23 0445 12/30/23 1952 12/31/23 0447 01/01/24 0518 01/02/24 0547  BUN 16 18 14 13 13 10 14   CREATININE 1.21*  1.30* 1.08* 1.21* 1.10* 0.93 0.86  CO2 24  --  26 27 26 27 29   K 3.9 3.7 4.0 4.2 3.9 3.4* 3.7    Normocytic anemia: Normocytic likely chronic-anemia panel as below stable -Of note hemoglobin was normal 05/12/2021 but in 2091 as low as 6.4 g after hip replacement needing transfusion.  FOBT is negative- anemia drop Likely hemodilution from ivf.Continue B12 supplementation.  Overall remains stable Recent Labs    12/29/23 1342 12/30/23 0445 12/31/23 0447 01/01/24 0518 01/02/24 0547  HGB 10.9* 9.7* 9.8* 9.7* 9.9*  MCV  --  91.6 92.7 93.0 91.4  VITAMINB12  --  213  --   --   --   FOLATE  --  15.5  --   --   --   FERRITIN  --  26  --   --   --   TIBC  --  403  --   --   --   IRON  --  60  --   --   --   RETICCTPCT  --  1.1  --   --   --     Recent left ankle injury: Followed by orthopedics supposed to have cast placed after skin cancer removed from her left leg. Metta Actis lt ankle ok.  WBAT may be ok with brace.   Discharge Exam: Vitals:   01/02/24 0735 01/02/24 1132  BP: (!) 144/106 (!) 145/83  Pulse: 67 97  Resp: 18 17  Temp: 97.9 F (36.6 C) 97.9 F (36.6 C)  SpO2: 99% 93%   General: Pt is alert, awake, not in acute distress Cardiovascular: RRR, S1/S2 +, no rubs, no gallops Respiratory: CTA bilaterally, no wheezing, no rhonchi Abdominal: Soft, NT, ND, bowel sounds + Extremities: no edema, no cyanosis  Discharge Instructions  Discharge Instructions     (HEART FAILURE PATIENTS) Call MD:  Anytime you have any of the following symptoms: 1) 3 pound weight gain in 24 hours or 5 pounds in 1 week 2) shortness of breath, with or without a dry hacking cough 3) swelling in the hands, feet or stomach 4) if you have to sleep on extra pillows at night in order to breathe.   Complete by: As directed    Discharge instructions   Complete by: As directed    Please call call MD or return to ER for similar or worsening recurring problem that brought you to hospital or if any  fever,nausea/vomiting,abdominal pain, uncontrolled pain, chest pain,  shortness of breath or any other alarming symptoms.  Please follow-up your doctor as instructed in a week time and call the office for appointment.  Please avoid alcohol, smoking, or any other illicit substance and maintain healthy habits including taking your regular medications as prescribed.  You were cared for by a hospitalist during your hospital stay. If you have any questions about your discharge medications or the care you received while you were in the hospital after you are discharged, you can call the unit and ask to speak with the hospitalist on call if the hospitalist that took care of you is not available.  Once you are discharged, your primary care physician will handle any further medical issues. Please note that NO REFILLS for any discharge medications will be authorized once you are discharged, as it is imperative that you return to your primary care physician (or establish a relationship with a primary care physician if you do not have one) for your aftercare needs so that they can reassess your need for medications and monitor your lab values   Increase activity slowly   Complete by: As directed       Allergies as of 01/02/2024       Reactions   Latex Rash   Penicillins Other (See Comments)   -Pt stated that this medication "does her no good"        Medication List     STOP taking these medications    amLODipine 5 MG tablet Commonly known as: NORVASC   aspirin EC 81 MG tablet   lisinopril 10 MG tablet Commonly known as: ZESTRIL       TAKE these medications    apixaban 5 MG Tabs tablet Commonly known as: ELIQUIS Take 1 tablet (5 mg total) by mouth 2 (two) times daily. Start taking on: January 07, 2024   furosemide 20 MG tablet Commonly known as: LASIX Take 20 mg by mouth.   metoprolol  succinate 25 MG 24 hr tablet Commonly known as: TOPROL -XL Take 1 tablet (25 mg total) by mouth 2  (two) times daily.   sertraline 25 MG tablet Commonly known as: ZOLOFT Take 25 mg by mouth daily.        Allergies  Allergen Reactions   Latex Rash   Penicillins Other (See Comments)    -Pt stated that this medication "does her no good"    The results of significant diagnostics from this hospitalization (including imaging, microbiology, ancillary and laboratory) are listed below for reference.    Microbiology: Recent Results (from the past 240 hours)  Surgical PCR screen     Status: None   Collection Time: 12/31/23  8:40 AM   Specimen: Nasal Mucosa; Nasal Swab  Result Value Ref Range Status   MRSA, PCR NEGATIVE NEGATIVE Final   Staphylococcus aureus NEGATIVE NEGATIVE Final    Comment: (NOTE) The Xpert SA Assay (FDA approved for NASAL specimens in patients 20 years of age and older), is one component of a comprehensive surveillance program. It is not intended to diagnose infection nor to guide or monitor treatment. Performed at [facility], [address] [place], [place] [address]     Procedures/Studies: DG Chest 2 View Result Date: 01/02/2024 CLINICAL DATA:  Status post pacemaker insertion EXAM: CHEST - 2 VIEW COMPARISON:  Chest radiograph dated 12/30/2023 FINDINGS: Lines/tubes: Left chest wall pacemaker leads project over the right atrium and ventricle. Lungs: Low lung volumes with bronchovascular crowding. Dense left basilar opacity. Pleura: Small bilateral pleural effusions.  No pneumothorax. Heart/mediastinum: Similar enlarged cardiomediastinal silhouette. Bones: No acute osseous abnormality. IMPRESSION: 1. Left chest wall pacemaker leads project over the right atrium and ventricle. No pneumothorax. 2. Small bilateral pleural effusions. 3. Dense left basilar opacity, likely atelectasis. Electronically Signed   By: Limin  Xu M.D.   On: 01/02/2024 08:35   EP PPM/ICD IMPLANT Result Date: 01/01/2024  CONCLUSIONS:  1.  Symptomatic bradycardia secondary to malignant  vasovagal syncope  2.  Successful dual-chamber permanent pacemaker with left bundle area lead  3.  No early apparent complications.  4.  Hold anticoagulation for 5 days (okay to restart January 07, 2024)   DG Chest Port 1 View Result Date: 12/30/2023 CLINICAL DATA:  Acute respiratory distress. EXAM: PORTABLE CHEST 1 VIEW COMPARISON:  12/29/2023. FINDINGS: The heart is enlarged and the mediastinal contour stable. There is chronic elevation of right diaphragm. Lung volumes are low. No consolidation, effusion, or pneumothorax is seen. No acute osseous abnormality. IMPRESSION: Stable chest with no active disease. Electronically Signed   By: Wyvonnia Heimlich M.D.   On: 12/30/2023 18:51   ECHOCARDIOGRAM COMPLETE Result Date: 12/30/2023    ECHOCARDIOGRAM REPORT   Patient Name:   Barbara Soto Date of Exam: 12/30/2023 Medical Rec #:  [id number]      Height:       61.0 in Accession #:    [id number]     Weight:       219.2 lb Date of Birth:  06-Oct-1940     BSA:          1.964 m Patient Age:    82 years       BP:           108/65 mmHg Patient Gender: F              HR:           78 bpm. Exam Location:  Inpatient Procedure: 2D Echo (Both Spectral and Color Flow Doppler were utilized during            procedure). Indications:    Atrial fibrillation  History:        Patient has no prior history of Echocardiogram examinations.                 Arrythmias:Atrial Fibrillation.  Sonographer:    Dione Franks RDCS Referring Phys: [id number] Kisa Fujii  Sonographer Comments: Image acquisition challenging due to respiratory motion and Image acquisition challenging due to patient body habitus. IMPRESSIONS  1. Left ventricular ejection fraction, by estimation, is 60 to 65%. The left ventricle has normal function. The left ventricle has no regional wall motion abnormalities. There is mild left ventricular hypertrophy. Left ventricular diastolic parameters are indeterminate.  2. Right ventricular systolic function was not well visualized. The  right ventricular size is not well visualized. There is moderately elevated pulmonary artery systolic pressure.  3. Left atrial size was moderately dilated.  4. The mitral valve is normal in structure. Trivial mitral valve regurgitation. No evidence of mitral stenosis.  5. The tricuspid valve is abnormal. Tricuspid valve regurgitation is moderate.  6. The aortic valve is tricuspid. There is mild calcification of the aortic valve. There is mild thickening of the aortic valve. Aortic valve regurgitation is not visualized. No aortic stenosis is present.  7. The inferior vena cava is dilated in size with >50% respiratory variability, suggesting right atrial pressure of 8 mmHg. FINDINGS  Left Ventricle: Left ventricular ejection fraction, by estimation, is 60 to 65%. The left ventricle has normal function. The left ventricle has no regional wall motion abnormalities. Definity contrast agent was given IV to delineate the left ventricular  endocardial borders. The left ventricular internal cavity size was normal in size. There is mild left ventricular hypertrophy. Left ventricular diastolic parameters are indeterminate. Right Ventricle: RV not well visualized. Grossly appears normal in size and function. The right ventricular size is not well visualized. Right vetricular wall thickness was not well visualized. Right ventricular systolic function was not well visualized.  There is moderately elevated pulmonary artery systolic pressure. The tricuspid regurgitant velocity is 3.56 m/s, and with an assumed  right atrial pressure of 8 mmHg, the estimated right ventricular systolic pressure is 58.7 mmHg. Left Atrium: Left atrial size was moderately dilated. Right Atrium: Right atrial size was normal in size. Pericardium: There is no evidence of pericardial effusion. Mitral Valve: The mitral valve is normal in structure. There is mild thickening of the mitral valve leaflet(s). There is mild calcification of the mitral valve  leaflet(s). Mild mitral annular calcification. Trivial mitral valve regurgitation. No evidence  of mitral valve stenosis. Tricuspid Valve: The tricuspid valve is abnormal. Tricuspid valve regurgitation is moderate . No evidence of tricuspid stenosis. Aortic Valve: The aortic valve is tricuspid. There is mild calcification of the aortic valve. There is mild thickening of the aortic valve. There is mild aortic valve annular calcification. Aortic valve regurgitation is not visualized. No aortic stenosis  is present. Aortic valve mean gradient measures 2.9 mmHg. Aortic valve peak gradient measures 7.3 mmHg. Aortic valve area, by VTI measures 2.09 cm. Pulmonic Valve: The pulmonic valve was not well visualized. Pulmonic valve regurgitation is not visualized. No evidence of pulmonic stenosis. Aorta: The aortic root and ascending aorta are structurally normal, with no evidence of dilitation. Venous: The inferior vena cava is dilated in size with greater than 50% respiratory variability, suggesting right atrial pressure of 8 mmHg. IAS/Shunts: The interatrial septum was not well visualized.  LEFT VENTRICLE PLAX 2D LVIDd:         3.90 cm LVIDs:         2.90 cm LV PW:         1.00 cm LV IVS:        1.20 cm LVOT diam:     1.90 cm LV SV:         48 LV SV Index:   25 LVOT Area:     2.84 cm  RIGHT VENTRICLE          IVC RV Basal diam:  2.30 cm  IVC diam: 2.60 cm TAPSE (M-mode): 1.3 cm LEFT ATRIUM             Index        RIGHT ATRIUM           Index LA diam:        4.60 cm 2.34 cm/m   RA Area:     14.20 cm LA Vol (A2C):   93.5 ml 47.61 ml/m  RA Volume:   31.90 ml  16.24 ml/m LA Vol (A4C):   83.3 ml 42.42 ml/m LA Biplane Vol: 88.8 ml 45.22 ml/m  AORTIC VALVE AV Area (Vmax):    1.79 cm AV Area (Vmean):   2.06 cm AV Area (VTI):     2.09 cm AV Vmax:           135.26 cm/s AV Vmean:          78.313 cm/s AV VTI:            0.232 m AV Peak Grad:      7.3 mmHg AV Mean Grad:      2.9 mmHg LVOT Vmax:         85.20 cm/s LVOT Vmean:         56.800 cm/s LVOT VTI:          0.171 m LVOT/AV VTI ratio: 0.74  AORTA Ao Root diam: 3.10 cm Ao Asc diam:  3.20 cm TRICUSPID VALVE TR Peak grad:   50.7 mmHg TR Vmax:        356.00 cm/s  SHUNTS Systemic  VTI:  0.17 m Systemic Diam: 1.90 cm Armida Lander MD Electronically signed by Armida Lander MD Signature Date/Time: 12/30/2023/3:32:16 PM    Final    DG Ankle 2 Views Left Result Date: 12/30/2023 CLINICAL DATA:  Left ankle, two views EXAM: LEFT ANKLE - 2 VIEW COMPARISON:  None Available. FINDINGS: No evidence of acute fracture or malalignment. Tibiotalar joint is symmetric. Artifact overlies the distal soft tissues. Moderate to large calcaneal spurring. IMPRESSION: 1. No evidence of acute fracture. 2. Calcaneal spurring. Electronically Signed   By: Reagan Camera M.D.   On: 12/30/2023 14:14   CT LUMBAR SPINE WO CONTRAST Result Date: 12/30/2023 EXAM: CT OF THE LUMBAR SPINE WITHOUT CONTRAST 12/30/2023 11:42:11 AM TECHNIQUE: CT of the lumbar spine was performed without the administration of intravenous contrast. Multiplanar reformatted images are provided for review. Automated exposure control, iterative reconstruction, and/or weight based adjustment of the mA/kV was utilized to reduce the radiation dose to as low as reasonably achievable. COMPARISON: Lumbar spine radiograph 01/08/2020 and lumbar spine MRI 07/20/2013. CLINICAL HISTORY: Low back pain, trauma. Back pain. FINDINGS: BONES AND ALIGNMENT: Conventional lumbosacral anatomy is assumed with 5 non-rib-bearing, lumbar-type vertebral bodies. Congenitally short pedicles throughout the lumbar spine. No acute fracture or suspicious bone lesion. Normal alignment. Ankylosis of the bilateral sacroiliac joints. DEGENERATIVE CHANGES: At L2-3, left eccentric disc bulge and left greater than right facet arthropathy result in at least mild central spinal canal stenosis with severe narrowing of the left subarticular zone and moderate bilateral neural foraminal  narrowing. At L3-4, disc extrusion and facet arthropathy result in mild spinal canal stenosis and moderate left neural foraminal narrowing. At L4-5, disc bulge and moderate bilateral facet arthropathy result in moderate spinal canal stenosis with severe left and moderate right neural foraminal narrowing. At L5-S1, disc bulge and facet arthropathy result in severe bilateral neural foraminal narrowing. SOFT TISSUES: No acute abnormality. Atherosclerotic calcifications of the abdominal aorta and its branches. IMPRESSION: 1. No acute traumatic injury to the lumbar spine . 2. Multilevel lumbar spondylosis, worse than L4-5, where there is moderate spinal canal stenosis, severe left and moderate right neural foraminal narrowing. Electronically signed by: Audra Blend MD 12/30/2023 11:54 AM EDT RP Workstation: [id number]   CT CHEST ABDOMEN PELVIS W CONTRAST Result Date: 12/29/2023 CLINICAL DATA:  Blunt trauma, status post fall. Wound bottom of steps. EXAM: CT CHEST, ABDOMEN, AND PELVIS WITH CONTRAST TECHNIQUE: Multidetector CT imaging of the chest, abdomen and pelvis was performed following the standard protocol during bolus administration of intravenous contrast. RADIATION DOSE REDUCTION: This exam was performed according to the departmental dose-optimization program which includes automated exposure control, adjustment of the mA and/or kV according to patient size and/or use of iterative reconstruction technique. CONTRAST:  60mL OMNIPAQUE  IOHEXOL  350 MG/ML SOLN COMPARISON:  None Available. FINDINGS: CT CHEST FINDINGS Cardiovascular: No evidence of acute aortic or traumatic vascular injury. The heart is normal in size. There is a small pericardial effusion. Coronary artery calcifications. Mild aortic atherosclerosis. Mediastinum/Nodes: No mediastinal hemorrhage or hematoma. No mediastinal or hilar adenopathy. No pneumomediastinum. Decompressed esophagus. Lungs/Pleura: No pneumothorax. No pulmonary contusion. Small  right pleural effusion appears to be simple fluid density. Hypoventilatory changes in the dependent lungs. Mild subpleural reticulation. Trachea and central airways are clear. Musculoskeletal: No acute fracture of the ribs, sternum, included clavicles or shoulder girdles. Degenerative change throughout the thoracic spine and both shoulders. No confluent chest wall contusion. CT ABDOMEN PELVIS FINDINGS Hepatobiliary: No hepatic injury or perihepatic hematoma. No focal liver abnormality. Cholecystectomy without  biliary dilatation. Pancreas: No evidence of injury. No ductal dilatation or inflammation. Spleen: No splenic injury or perisplenic hematoma. Adrenals/Urinary Tract: No adrenal nodule or hemorrhage. No evidence of renal injury. No hydronephrosis. Homogeneous renal enhancement. Urinary bladder is decompressed. No evidence of bladder injury. Stomach/Bowel: No evidence of bowel injury or mesenteric hematoma. Stomach is decompressed. Small duodenal diverticulum. No bowel wall thickening or inflammation. Small volume of stool throughout the colon. Colonic diverticulosis without diverticulitis. The appendix is not confidently seen. Vascular/Lymphatic: Aortic atherosclerosis. No aortic aneurysm. No vascular injury or retroperitoneal fluid. No bulky adenopathy. Reproductive: Status post hysterectomy. No adnexal masses. Other: No free air or free fluid.  No confluent body wall contusion. Musculoskeletal: Right hip arthroplasty. No periprosthetic lucency. No acute fracture of the pelvis or lumbar spine. Degenerative change of the lumbar spine and left hip. IMPRESSION: 1. No evidence of acute traumatic injury to the chest, abdomen, or pelvis. 2. Small right pleural effusion. Small pericardial effusion. 3. Coronary artery calcifications. 4. Colonic diverticulosis without diverticulitis. Aortic Atherosclerosis (ICD10-I70.0). Electronically Signed   By: Chadwick Colonel M.D.   On: 12/29/2023 16:36   DG Pelvis  Portable Result Date: 12/29/2023 CLINICAL DATA:  Trauma, fall. EXAM: PORTABLE PELVIS 1-2 VIEWS COMPARISON:  None Available. FINDINGS: Technically limited by soft tissue attenuation from habitus. No evidence of pelvic fracture. Right hip arthroplasty is only partially included in the field of view, intact were visualized. Left hip osteoarthritis without hip dislocation. Pubic rami are grossly intact. No pubic symphyseal or sacroiliac diastasis. IMPRESSION: 1. No pelvic fracture. 2. Right hip arthroplasty. 3. Left hip osteoarthritis. Electronically Signed   By: Chadwick Colonel M.D.   On: 12/29/2023 16:29   DG Chest Port 1 View Result Date: 12/29/2023 CLINICAL DATA:  Trauma, fall. EXAM: PORTABLE CHEST 1 VIEW COMPARISON:  Radiograph 08/24/2020 FINDINGS: Low lung volumes limit assessment. Prominent heart size likely accentuated by technique. Artifact projects over the right lung base. No evidence of pneumothorax, pleural effusion or focal airspace disease. No grossly displaced fracture on limited osseous assessment. IMPRESSION: Hypoventilatory chest without evidence of acute traumatic injury. Electronically Signed   By: Chadwick Colonel M.D.   On: 12/29/2023 16:29   CT HEAD WO CONTRAST Result Date: 12/29/2023 CLINICAL DATA:  Head trauma, moderate-severe; Polytrauma, blunt. Level 2 trauma. Fall. Found at the bottom of steps unconscious. EXAM: CT HEAD WITHOUT CONTRAST CT CERVICAL SPINE WITHOUT CONTRAST TECHNIQUE: Multidetector CT imaging of the head and cervical spine was performed following the standard protocol without intravenous contrast. Multiplanar CT image reconstructions of the cervical spine were also generated. RADIATION DOSE REDUCTION: This exam was performed according to the departmental dose-optimization program which includes automated exposure control, adjustment of the mA and/or kV according to patient size and/or use of iterative reconstruction technique. COMPARISON:  CT head and cervical spine  06/30/2008 FINDINGS: CT HEAD FINDINGS Brain: There is no evidence of an acute infarct, intracranial hemorrhage, mass, midline shift, or extra-axial fluid collection. Mild cerebral atrophy is within normal limits for age. Cerebral white matter hypodensities are nonspecific but compatible with mild chronic small vessel ischemic disease. Vascular: Calcified atherosclerosis at the skull base. No hyperdense vessel. Skull: No acute fracture or suspicious lesion. Sinuses/Orbits: Chronic sinusitis including subtotal opacification of the right sphenoid sinus. Small chronic left mastoid effusion. Bilateral cataract extraction. Other: None. CT CERVICAL SPINE FINDINGS Alignment: No significant subluxation. Skull base and vertebrae: No acute fracture or suspicious lesion. Soft tissues and spinal canal: No prevertebral fluid or swelling. No visible canal hematoma. Disc  levels: Mild spondylosis. Advanced multilevel cervical facet arthrosis with facet ankylosis on the left at C2-3 and right at C3-4. Severe neural foraminal stenosis on the right at C3-4 and on the left at C4-5. No evidence of high-grade spinal canal stenosis. Upper chest: Evaluated on the separately reported contemporaneous CT of the chest, abdomen, and pelvis. Other: None. IMPRESSION: 1. No evidence of acute intracranial abnormality or cervical spine fracture. 2. Mild chronic small vessel ischemic disease. Electronically Signed   By: Aundra Lee M.D.   On: 12/29/2023 16:13   CT CERVICAL SPINE WO CONTRAST Result Date: 12/29/2023 CLINICAL DATA:  Head trauma, moderate-severe; Polytrauma, blunt. Level 2 trauma. Fall. Found at the bottom of steps unconscious. EXAM: CT HEAD WITHOUT CONTRAST CT CERVICAL SPINE WITHOUT CONTRAST TECHNIQUE: Multidetector CT imaging of the head and cervical spine was performed following the standard protocol without intravenous contrast. Multiplanar CT image reconstructions of the cervical spine were also generated. RADIATION DOSE  REDUCTION: This exam was performed according to the departmental dose-optimization program which includes automated exposure control, adjustment of the mA and/or kV according to patient size and/or use of iterative reconstruction technique. COMPARISON:  CT head and cervical spine 06/30/2008 FINDINGS: CT HEAD FINDINGS Brain: There is no evidence of an acute infarct, intracranial hemorrhage, mass, midline shift, or extra-axial fluid collection. Mild cerebral atrophy is within normal limits for age. Cerebral white matter hypodensities are nonspecific but compatible with mild chronic small vessel ischemic disease. Vascular: Calcified atherosclerosis at the skull base. No hyperdense vessel. Skull: No acute fracture or suspicious lesion. Sinuses/Orbits: Chronic sinusitis including subtotal opacification of the right sphenoid sinus. Small chronic left mastoid effusion. Bilateral cataract extraction. Other: None. CT CERVICAL SPINE FINDINGS Alignment: No significant subluxation. Skull base and vertebrae: No acute fracture or suspicious lesion. Soft tissues and spinal canal: No prevertebral fluid or swelling. No visible canal hematoma. Disc levels: Mild spondylosis. Advanced multilevel cervical facet arthrosis with facet ankylosis on the left at C2-3 and right at C3-4. Severe neural foraminal stenosis on the right at C3-4 and on the left at C4-5. No evidence of high-grade spinal canal stenosis. Upper chest: Evaluated on the separately reported contemporaneous CT of the chest, abdomen, and pelvis. Other: None. IMPRESSION: 1. No evidence of acute intracranial abnormality or cervical spine fracture. 2. Mild chronic small vessel ischemic disease. Electronically Signed   By: Aundra Lee M.D.   On: 12/29/2023 16:13    Labs: BNP (last 3 results) Recent Labs    12/29/23 1337  BNP 182.3*   Basic Metabolic Panel: Recent Labs  Lab 12/30/23 0445 12/30/23 1952 12/31/23 0447 01/01/24 0518 01/02/24 0547  NA 140 139 138  136 138  K 4.0 4.2 3.9 3.4* 3.7  CL 103 103 104 102 103  CO2 26 27 26 27 29   GLUCOSE 103* 135* 121* 94 95  BUN 14 13 13 10 14   CREATININE 1.08* 1.21* 1.10* 0.93 0.86  CALCIUM 8.5* 8.5* 8.1* 8.1* 8.5*  MG  --  1.9  --   --   --    Liver Function Tests: Recent Labs  Lab 12/29/23 1337 12/30/23 1952  AST 30 30  ALT 14 15  ALKPHOS 85 75  BILITOT 0.9 1.4*  PROT 6.6 6.5  ALBUMIN 3.7 3.4*   No results for input(s): "LIPASE", "AMYLASE" in the last 168 hours. No results for input(s): "AMMONIA" in the last 168 hours. CBC: Recent Labs  Lab 12/29/23 1337 12/29/23 1342 12/30/23 0445 12/31/23 1610 01/01/24 0518 01/02/24 9604  WBC 5.4  --  6.0 6.9 6.1 6.2  HGB 10.7* 10.9* 9.7* 9.8* 9.7* 9.9*  HCT 33.6* 32.0* 30.6* 30.7* 30.7* 29.9*  MCV 92.3  --  91.6 92.7 93.0 91.4  PLT 181  --  174 163 140* 141*   Cardiac Enzymes: No results for input(s): "CKTOTAL", "CKMB", "CKMBINDEX", "TROPONINI" in the last 168 hours. BNP: Invalid input(s): "POCBNP" CBG: Recent Labs  Lab 12/30/23 1847  GLUCAP 129*   D-Dimer No results for input(s): "DDIMER" in the last 72 hours. Hgb A1c No results for input(s): "HGBA1C" in the last 72 hours. Lipid Profile No results for input(s): "CHOL", "HDL", "LDLCALC", "TRIG", "CHOLHDL", "LDLDIRECT" in the last 72 hours. Thyroid function studies No results for input(s): "TSH", "T4TOTAL", "T3FREE", "THYROIDAB" in the last 72 hours.  Invalid input(s): "FREET3" Anemia work up No results for input(s): "VITAMINB12", "FOLATE", "FERRITIN", "TIBC", "IRON", "RETICCTPCT" in the last 72 hours. Urinalysis    Component Value Date/Time   COLORURINE YELLOW 12/29/2023 1337   APPEARANCEUR CLEAR 12/29/2023 1337   LABSPEC 1.027 12/29/2023 1337   PHURINE 6.0 12/29/2023 1337   GLUCOSEU NEGATIVE 12/29/2023 1337   HGBUR NEGATIVE 12/29/2023 1337   BILIRUBINUR NEGATIVE 12/29/2023 1337   KETONESUR NEGATIVE 12/29/2023 1337   PROTEINUR NEGATIVE 12/29/2023 1337   NITRITE NEGATIVE  12/29/2023 1337   LEUKOCYTESUR MODERATE (A) 12/29/2023 1337   Sepsis Labs Recent Labs  Lab 12/30/23 0445 12/31/23 0447 01/01/24 0518 01/02/24 0547  WBC 6.0 6.9 6.1 6.2   Microbiology Recent Results (from the past 240 hours)  Surgical PCR screen     Status: None   Collection Time: 12/31/23  8:40 AM   Specimen: Nasal Mucosa; Nasal Swab  Result Value Ref Range Status   MRSA, PCR NEGATIVE NEGATIVE Final   Staphylococcus aureus NEGATIVE NEGATIVE Final    Comment: (NOTE) The Xpert SA Assay (FDA approved for NASAL specimens in patients 8 years of age and older), is one component of a comprehensive surveillance program. It is not intended to diagnose infection nor to guide or monitor treatment. Performed at [facility], [address] [place], [place] [address]      Time coordinating discharge: 35 minutes  SIGNED: Lesa Rape, MD  [facility] 01/02/2024, 11:33 AM  If 7PM-7AM, please contact night-coverage [email or web address]

## 2024-01-02 NOTE — Telephone Encounter (Signed)
 Received a call over the weekend from Ms. Hand. She had just received a call from someone at [facility] who told her that her grandmother had coded and she was on her way in to the hospital and did not know where to go or what to do. She was extremely upset. I gave her the information on how to get to her room. JM

## 2024-01-02 NOTE — Evaluation (Signed)
 Occupational Therapy Evaluation Patient Details Name: Barbara Soto MRN: [id number] DOB: 07-Jul-1941 Today's Date: 01/02/2024   History of Present Illness   82y brought to the ED by EMS after found down at the bottom of 4 steps with face down in the dirt unconscious. Admitted for treatment of syncope and Afib with RVR, Pt coded on 15-Jan-2024 requiring CPR for a few seconds before ROSC, s/p 6/2 PPM placement   PMH: recent L ankle injury from a fall, depression, hypertension     Clinical Impressions PTA, pt was living at home with her son, in a single story home and reports she was independent with ADL/IADL and functional mobility with use of spc this past week for comfort. Pt reports she is still driving and working as an Engineer, production for a lady with dementia. Pt currently requires minA+2 for bed mobility, functional mobility with single HHA. Pt requires minA for LB dressing, grooming and UB due to mobility limitations following PPM and LUE in sling. Due to decline in current level of function, pt would benefit from acute OT to address established goals to facilitate safe D/C to venue listed below. At this time, recommend HHOT follow-up. Will continue to follow acutely.      If plan is discharge home, recommend the following:   A little help with walking and/or transfers;A little help with bathing/dressing/bathroom;Assistance with cooking/housework;Assist for transportation     Functional Status Assessment   Patient has had a recent decline in their functional status and demonstrates the ability to make significant improvements in function in a reasonable and predictable amount of time.     Equipment Recommendations   None recommended by OT     Recommendations for Other Services         Precautions/Restrictions   Precautions Precautions: Fall;ICD/Pacemaker Required Braces or Orthoses: Sling;Other Brace Other Brace: ACE bandage for support of L ankle from prior sprain, secondary to pt  inablity to tolerate walking boot or cast due to skin cancer on LE Restrictions Weight Bearing Restrictions Per Provider Order: Yes LUE Weight Bearing Per Provider Order: Non weight bearing Other Position/Activity Restrictions: PPM     Mobility Bed Mobility Overal bed mobility: Needs Assistance Bed Mobility: Supine to Sit     Supine to sit: Min assist, +2 for physical assistance, HOB elevated     General bed mobility comments: pt able to achieve longsitting with HoB elevated, min Ax2 for use of bed pad to support torso as pt managed LE off EoB    Transfers Overall transfer level: Needs assistance Equipment used: 2 person hand held assist Transfers: Sit to/from Stand Sit to Stand: Min assist, +2 safety/equipment           General transfer comment: good power up, minAx2 for steadying in standing      Balance Overall balance assessment: Needs assistance Sitting-balance support: Feet supported, No upper extremity supported Sitting balance-Leahy Scale: Good     Standing balance support: Bilateral upper extremity supported, During functional activity Standing balance-Leahy Scale: Zero Standing balance comment: provided 2 person HHA, will bring cane in next session                           ADL either performed or assessed with clinical judgement   ADL Overall ADL's : Needs assistance/impaired Eating/Feeding: Set up   Grooming: Minimal assistance;Standing   Upper Body Bathing: Minimal assistance;Sitting   Lower Body Bathing: Moderate assistance   Upper Body Dressing :  Moderate assistance   Lower Body Dressing: Moderate assistance   Toilet Transfer: Minimal assistance;Ambulation   Toileting- Clothing Manipulation and Hygiene: Minimal assistance;Sit to/from stand;+2 for safety/equipment       Functional mobility during ADLs: Minimal assistance;+2 for safety/equipment (therapist provided hha but pt would require use of cane) General ADL Comments:  increased need for assistance secondary to mobility limitations due to sling and PPM precautions     Vision         Perception         Praxis         Pertinent Vitals/Pain Pain Assessment Pain Assessment: No/denies pain     Extremity/Trunk Assessment Upper Extremity Assessment Upper Extremity Assessment: Overall WFL for tasks assessed;LUE deficits/detail LUE Deficits / Details: restricted assessment due to sling and PPM precautions LUE: Unable to fully assess due to immobilization   Lower Extremity Assessment Lower Extremity Assessment: Defer to PT evaluation;LLE deficits/detail LLE Deficits / Details: sever L ankle sprain from fall prior to fall immediately prior to hospitalization   Cervical / Trunk Assessment Cervical / Trunk Assessment: Kyphotic   Communication Communication Communication: No apparent difficulties   Cognition Arousal: Alert Behavior During Therapy: WFL for tasks assessed/performed                                 Following commands: Intact       Cueing  General Comments   Cueing Techniques: Verbal cues;Tactile cues;Visual cues  pt on 2L O2 via [place] on entry, removed and although poor pleth waveform during ambulation, no SoB, and when good pleth waveform in sitting in high 90s, abbrasions to bilateral knees   Exercises     Shoulder Instructions      Home Living Family/patient expects to be discharged to:: Private residence Living Arrangements: Children Available Help at Discharge: Available 24 hours/day Type of Home: House Home Access: Stairs to enter [employer] of Steps: 1 Entrance Stairs-Rails: None Home Layout: One level     Bathroom Shower/Tub: Chief Strategy Officer: Standard Bathroom Accessibility: No   Home Equipment: Shower seat;BSC/3in1;Adaptive equipment;Cane - quad;Rolling Walker (2 wheels) Adaptive Equipment: Reacher        Prior Functioning/Environment Prior Level of Function  : Independent/Modified Independent             Mobility Comments: used cane after fall, driving ADLs Comments: works as Engineer, production for a lady with dementia, providing light assist for transitions, gardening prior to coming into hospital    OT Problem List: Decreased range of motion;Decreased activity tolerance;Impaired balance (sitting and/or standing);Decreased knowledge of use of DME or AE;Decreased knowledge of precautions;Obesity   OT Treatment/Interventions: Self-care/ADL training;Therapeutic exercise;Energy conservation;DME and/or AE instruction;Therapeutic activities;Balance training;Patient/family education      OT Goals(Current goals can be found in the care plan section)   Acute Rehab OT Goals Patient Stated Goal: to go home OT Goal Formulation: With patient Time For Goal Achievement: 01/16/24 Potential to Achieve Goals: Good ADL Goals Pt Will Perform Grooming: with contact guard assist;standing Pt Will Perform Lower Body Dressing: with contact guard assist;sit to/from stand;with adaptive equipment Pt Will Transfer to Toilet: with supervision;ambulating Additional ADL Goal #1: Pt will complete bed mobility at supervision level in preparation for ADL/functional mobility.   OT Frequency:  Min 2X/week    Co-evaluation PT/OT/SLP Co-Evaluation/Treatment: Yes Reason for Co-Treatment: Complexity of the patient's impairments (multi-system involvement);For patient/therapist safety;To address functional/ADL transfers PT goals addressed  during session: Mobility/safety with mobility OT goals addressed during session: ADL's and self-care      AM-PAC OT "6 Clicks" Daily Activity     Outcome Measure Help from another person eating meals?: A Little Help from another person taking care of personal grooming?: A Little Help from another person toileting, which includes using toliet, bedpan, or urinal?: A Little Help from another person bathing (including washing, rinsing, drying)?: A  Little Help from another person to put on and taking off regular upper body clothing?: A Little Help from another person to put on and taking off regular lower body clothing?: A Little 6 Click Score: 18   End of Session Equipment Utilized During Treatment: Gait belt;Rolling walker (2 wheels);Oxygen Nurse Communication: Mobility status  Activity Tolerance: Patient tolerated treatment well Patient left: in chair;with call bell/phone within reach;with chair alarm set  OT Visit Diagnosis: Other abnormalities of gait and mobility (R26.89);Muscle weakness (generalized) (M62.81)                Time: 9604-5409 OT Time Calculation (min): 29 min Charges:  OT General Charges $OT Visit: 1 Visit OT Evaluation $OT Eval Moderate Complexity: 1 Mod  Sally-Ann Cutbirth OTR/L Acute Rehabilitation Services Office: [phone]   Birtha Bullion 01/02/2024, 10:37 AM

## 2024-01-02 NOTE — Evaluation (Signed)
 Physical Therapy Evaluation Patient Details Name: Barbara Soto MRN: [id number] DOB: 1940-11-22 Today's Date: 01/02/2024  History of Present Illness  82y brought to the ED by EMS after found down at the bottom of 4 steps with face down in the dirt unconscious. Admitted for treatment of syncope and Afib with RVR, Pt coded on 01-29-24 requiring CPR for a few seconds before ROSC, s/p 6/2 PPM placement   PMH: recent L ankle injury from a fall, depression, hypertension  Clinical Impression  PTA pt living with son in single story home with 1 step to enter. Pt completely independent, driving and working as HHAide for lady with dementia, providing min physical assist for steadying, prepares meals and cleans. Pt is currently limited in safe mobility by pacemaker precautions (L UE in sling), rib pain from CPR, and L ankle pain, in presence of decreased strength, balance and endurance. Pt currently requires min Ax2 for bed mobility, transfers and ambulation with 2 person HHA. PT recommending HHPT and use of her cane at discharge. PT will continue to follow acutely and has referred pt to mobility specialist.          If plan is discharge home, recommend the following: A little help with walking and/or transfers;A little help with bathing/dressing/bathroom;Assist for transportation;Help with stairs or ramp for entrance   Can travel by private vehicle    Yes    Equipment Recommendations None recommended by PT     Functional Status Assessment Patient has had a recent decline in their functional status and demonstrates the ability to make significant improvements in function in a reasonable and predictable amount of time.     Precautions / Restrictions Precautions Precautions: Fall;ICD/Pacemaker Required Braces or Orthoses: Sling;Other Brace Other Brace: ACE bandage for support of L ankle from prior sprain, secondary to pt inablity to tolerate walking boot or cast due to skin cancer on  LE Restrictions Weight Bearing Restrictions Per Provider Order: Yes LUE Weight Bearing Per Provider Order: Non weight bearing Other Position/Activity Restrictions: PPM      Mobility  Bed Mobility Overal bed mobility: Needs Assistance Bed Mobility: Supine to Sit     Supine to sit: Min assist, +2 for physical assistance, HOB elevated     General bed mobility comments: pt able to achieve longsitting with HoB elevated, min Ax2 for use of bed pad to support torso as pt managed LE off EoB    Transfers Overall transfer level: Needs assistance Equipment used: 2 person hand held assist Transfers: Sit to/from Stand Sit to Stand: Min assist, +2 safety/equipment           General transfer comment: good power up, minAx2 for steadying in standing    Ambulation/Gait Ambulation/Gait assistance: Min assist, +2 physical assistance Gait Distance (Feet): 18 Feet Assistive device: 2 person hand held assist Gait Pattern/deviations: Step-through pattern, Shuffle, Decreased weight shift to left Gait velocity: slowed Gait velocity interpretation: <1.31 ft/sec, indicative of household ambulator   General Gait Details: minAx2 for steadying with shuffling gait with increased lateral sway, decreased weightshift to L due to ankle sprain        Balance Overall balance assessment: Needs assistance Sitting-balance support: Feet supported, No upper extremity supported Sitting balance-Leahy Scale: Good     Standing balance support: Bilateral upper extremity supported, During functional activity Standing balance-Leahy Scale: Zero Standing balance comment: provided 2 person HHA, will bring cane in next session  Pertinent Vitals/Pain Pain Assessment Pain Assessment: No/denies pain (before and after ambulation, slight antalgic gait)    Home Living Family/patient expects to be discharged to:: Private residence Living Arrangements: Children Available Help  at Discharge: Available 24 hours/day Type of Home: House Home Access: Stairs to enter   [employer] of Steps: 1   Home Layout: One level Home Equipment: Shower seat;BSC/3in1;Adaptive equipment;Cane - quad      Prior Function Prior Level of Function : Independent/Modified Independent             Mobility Comments: used cane after fall, driving ADLs Comments: works as Engineer, production for a lady with dementia, providing light assist for transittions, gardening prior to coming into hospital     Extremity/Trunk Assessment   Upper Extremity Assessment Upper Extremity Assessment: Defer to OT evaluation    Lower Extremity Assessment Lower Extremity Assessment: LLE deficits/detail;Generalized weakness LLE Deficits / Details: sever L ankle sprain from fall prior to fall immediately prior to hospitalization    Cervical / Trunk Assessment Cervical / Trunk Assessment: Kyphotic  Communication   Communication Communication: No apparent difficulties    Cognition Arousal: Alert Behavior During Therapy: WFL for tasks assessed/performed   PT - Cognitive impairments: No apparent impairments                         Following commands: Intact       Cueing Cueing Techniques: Verbal cues, Tactile cues, Visual cues     General Comments General comments (skin integrity, edema, etc.): pt on 2L O2 via  on entry, removed and although poor pleth waveform during ambulation, no SoB, and when good pleth waveform in sitting in high 90s, abbrasions to bilateral knees        Assessment/Plan    PT Assessment Patient needs continued PT services  PT Problem List Decreased strength       PT Treatment Interventions DME instruction;Gait training;Stair training;Functional mobility training;Therapeutic activities;Therapeutic exercise;Balance training;Cognitive remediation;Patient/family education    PT Goals (Current goals can be found in the Care Plan section)  Acute Rehab PT  Goals PT Goal Formulation: With patient Time For Goal Achievement: 01/16/24 Potential to Achieve Goals: Fair    Frequency Min 2X/week     Co-evaluation PT/OT/SLP Co-Evaluation/Treatment: Yes Reason for Co-Treatment: Complexity of the patient's impairments (multi-system involvement) PT goals addressed during session: Mobility/safety with mobility         AM-PAC PT "6 Clicks" Mobility  Outcome Measure Help needed turning from your back to your side while in a flat bed without using bedrails?: A Little Help needed moving from lying on your back to sitting on the side of a flat bed without using bedrails?: A Little Help needed moving to and from a bed to a chair (including a wheelchair)?: A Little Help needed standing up from a chair using your arms (e.g., wheelchair or bedside chair)?: A Little Help needed to walk in hospital room?: A Little Help needed climbing 3-5 steps with a railing? : A Lot 6 Click Score: 17    End of Session Equipment Utilized During Treatment: Gait belt Activity Tolerance: Patient tolerated treatment well Patient left: in chair;with call bell/phone within reach;with chair alarm set Nurse Communication: Mobility status PT Visit Diagnosis: Unsteadiness on feet (R26.81);Other abnormalities of gait and mobility (R26.89);Muscle weakness (generalized) (M62.81);Difficulty in walking, not elsewhere classified (R26.2)    Time: 8119-1478 PT Time Calculation (min) (ACUTE ONLY): 28 min   Charges:   PT Evaluation $PT  Eval Moderate Complexity: 1 Mod   PT General Charges $$ ACUTE PT VISIT: 1 Visit         Kaleem Sartwell B. Jewel Mortimer PT, DPT Acute Rehabilitation Services Please use secure chat or  Call Office [phone]   Barbara Soto [facility] 01/02/2024, 9:46 AM

## 2024-01-02 NOTE — Progress Notes (Signed)
 Patient experienced near syncope around 18:10 while being assisted by staff to toilet.  Apparently witnessed to have a momentary blank stare and rigid upper extremities with quick return to baseline orientation/mentation; reported to this RN after event.  No events noted on telemetry around this time.  BP and other vital signs grossly unchanged.  Additional event noted during shift report around 19:35, witnessed by this RN and Aurelio Blower, RN during toileting again.  Patient was leaned back on the toilet, groaning, agonal breathing and minimally responsive while having a bowel movement.  She did return quickly to baseline and was able to ambulate back to bed with walker and assistance.  BM appeared normal in character and was medium sized.  Vital signs following event were similar to previous; HR and BP mildly elevated with atrial fibrillation ongoing:  01/02/24 1939  Vitals  Temp 98.1 F (36.7 C)  Temp Source Oral  BP (!) 158/91  MAP (mmHg) 112  BP Location Right Arm  BP Method Automatic  Patient Position (if appropriate) Lying  Pulse Rate 92  Pulse Rate Source Monitor  ECG Heart Rate (!) 108  Resp 20  Level of Consciousness  Level of Consciousness Alert  MEWS COLOR  MEWS Score Color Green  Oxygen Therapy  SpO2 91 %  O2 Device Room Air  MEWS Score  MEWS Temp 0  MEWS Systolic 0  MEWS Pulse 1  MEWS RR 0  MEWS LOC 0  MEWS Score 1   Discussed events with patient/family at bedside for any further potential insight; no specific preceding symptoms, etc. noted by patient.  She expressed embarrassment and concern that we would think she is "crazy" due to these events.  Provided emotional support and assured her this is not the case; clear there is some physiologic cause still needing to be investigated and managed, if possible.  MD not notified at this time since these additional events mirror previous with no new symptoms and no need to provide emergency management.  Hand-off given to  Joyce Atambile, RN for ongoing care overnight.

## 2024-01-02 NOTE — Progress Notes (Signed)
 Reviewed AVS, patient expressed understanding of medications, MD follow up reviewed.   Removed IV, Site clean, dry and intact.  Patient states all belongings brought to the hospital at time of admission are accounted for and packed to take home.  Patient informed to pick up medications from [facility] pharmacy.

## 2024-01-02 NOTE — Progress Notes (Signed)
 Rounding Note   Patient Name: Barbara Soto Date of Encounter: 01/02/2024  [facility] Cardiologist: None   Subjective   Feels well, denies pain at implant site, no CP, SOB   Scheduled Meds:  acetaminophen   1,000 mg Oral TID   lidocaine  1 patch Transdermal Q24H   metoprolol  succinate  25 mg Oral BID   sertraline  25 mg Oral Daily   vitamin B-12  500 mcg Oral Daily   Continuous Infusions:   PRN Meds: acetaminophen , methocarbamol, ondansetron  **OR** ondansetron  (ZOFRAN ) IV, traMADol   Vital Signs  Vitals:   01/01/24 2225 01/02/24 0048 01/02/24 0458 01/02/24 0735  BP: 133/81 (!) 155/95  (!) 144/106  Pulse:  99  67  Resp:  20 19 18   Temp:  98.4 F (36.9 C) 98.3 F (36.8 C) 97.9 F (36.6 C)  TempSrc:  Oral Oral Oral  SpO2:  93%  99%  Weight:      Height:        Intake/Output Summary (Last 24 hours) at 01/02/2024 1053 Last data filed at 01/01/2024 2000 Gross per 24 hour  Intake 100 ml  Output 450 ml  Net -350 ml      12/29/2023    7:01 PM 12/29/2023    1:48 PM  Last 3 Weights  Weight (lbs) 219 lb 3.2 oz 212 lb  Weight (kg) 99.428 kg 96.163 kg      Telemetry AFib 90's mostly -100's  - Personally Reviewed  ECG  AFib 103bpm - Personally Reviewed  Physical Exam  GEN: No acute distress.   Neck: No JVD Cardiac: irreg-irreg, no murmurs, rubs, or gallops.  Respiratory: Clear to auscultation bilaterally. GI: Soft, nontender, non-distended  MS: No edema; No deformity. Neuro:  Nonfocal  Psych: Normal affect   PPM site is CDI, no bleeding or hematoma  Labs High Sensitivity Troponin:   Recent Labs  Lab 12/29/23 1337 12/29/23 1634 12/30/23 1952  TROPONINIHS 7 9 11      Chemistry Recent Labs  Lab 12/29/23 1337 12/29/23 1342 12/30/23 1952 12/31/23 0447 01/01/24 0518 01/02/24 0547  NA 139   < > 139 138 136 138  K 3.9   < > 4.2 3.9 3.4* 3.7  CL 102   < > 103 104 102 103  CO2 24   < > 27 26 27 29   GLUCOSE 132*   < > 135* 121* 94 95   BUN 16   < > 13 13 10 14   CREATININE 1.21*   < > 1.21* 1.10* 0.93 0.86  CALCIUM 8.6*   < > 8.5* 8.1* 8.1* 8.5*  MG  --   --  1.9  --   --   --   PROT 6.6  --  6.5  --   --   --   ALBUMIN 3.7  --  3.4*  --   --   --   AST 30  --  30  --   --   --   ALT 14  --  15  --   --   --   ALKPHOS 85  --  75  --   --   --   BILITOT 0.9  --  1.4*  --   --   --   GFRNONAA 45*   < > 45* 50* >60 >60  ANIONGAP 13   < > 9 8 7 6    < > = values in this interval not displayed.    Lipids No results  for input(s): "CHOL", "TRIG", "HDL", "LABVLDL", "LDLCALC", "CHOLHDL" in the last 168 hours.  Hematology Recent Labs  Lab 12/31/23 0447 01/01/24 0518 01/02/24 0547  WBC 6.9 6.1 6.2  RBC 3.31* 3.30* 3.27*  HGB 9.8* 9.7* 9.9*  HCT 30.7* 30.7* 29.9*  MCV 92.7 93.0 91.4  MCH 29.6 29.4 30.3  MCHC 31.9 31.6 33.1  RDW 14.4 14.3 14.1  PLT 163 140* 141*   Thyroid  Recent Labs  Lab 12/29/23 1634  TSH 0.656    BNP Recent Labs  Lab 12/29/23 1337  BNP 182.3*    DDimer No results for input(s): "DDIMER" in the last 168 hours.   Radiology  DG Chest Port 1 View Result Date: 12/30/2023 CLINICAL DATA:  Acute respiratory distress. EXAM: PORTABLE CHEST 1 VIEW COMPARISON:  12/29/2023. FINDINGS: The heart is enlarged and the mediastinal contour stable. There is chronic elevation of right diaphragm. Lung volumes are low. No consolidation, effusion, or pneumothorax is seen. No acute osseous abnormality. IMPRESSION: Stable chest with no active disease. Electronically Signed   By: Wyvonnia Heimlich M.D.   On: 12/30/2023 18:51    DG Ankle 2 Views Left Result Date: 12/30/2023 CLINICAL DATA:  Left ankle, two views EXAM: LEFT ANKLE - 2 VIEW COMPARISON:  None Available. FINDINGS: No evidence of acute fracture or malalignment. Tibiotalar joint is symmetric. Artifact overlies the distal soft tissues. Moderate to large calcaneal spurring. IMPRESSION: 1. No evidence of acute fracture. 2. Calcaneal spurring. Electronically Signed    By: Reagan Camera M.D.   On: 12/30/2023 14:14   CT LUMBAR SPINE WO CONTRAST Result Date: 12/30/2023 EXAM: CT OF THE LUMBAR SPINE WITHOUT CONTRAST 12/30/2023 11:42:11 AM TECHNIQUE: CT of the lumbar spine was performed without the administration of intravenous contrast. Multiplanar reformatted images are provided for review. Automated exposure control, iterative reconstruction, and/or weight based adjustment of the mA/kV was utilized to reduce the radiation dose to as low as reasonably achievable. COMPARISON: Lumbar spine radiograph 01/08/2020 and lumbar spine MRI 07/20/2013. CLINICAL HISTORY: Low back pain, trauma. Back pain. FINDINGS: BONES AND ALIGNMENT: Conventional lumbosacral anatomy is assumed with 5 non-rib-bearing, lumbar-type vertebral bodies. Congenitally short pedicles throughout the lumbar spine. No acute fracture or suspicious bone lesion. Normal alignment. Ankylosis of the bilateral sacroiliac joints. DEGENERATIVE CHANGES: At L2-3, left eccentric disc bulge and left greater than right facet arthropathy result in at least mild central spinal canal stenosis with severe narrowing of the left subarticular zone and moderate bilateral neural foraminal narrowing. At L3-4, disc extrusion and facet arthropathy result in mild spinal canal stenosis and moderate left neural foraminal narrowing. At L4-5, disc bulge and moderate bilateral facet arthropathy result in moderate spinal canal stenosis with severe left and moderate right neural foraminal narrowing. At L5-S1, disc bulge and facet arthropathy result in severe bilateral neural foraminal narrowing. SOFT TISSUES: No acute abnormality. Atherosclerotic calcifications of the abdominal aorta and its branches. IMPRESSION: 1. No acute traumatic injury to the lumbar spine . 2. Multilevel lumbar spondylosis, worse than L4-5, where there is moderate spinal canal stenosis, severe left and moderate right neural foraminal narrowing. Electronically signed by: Audra Blend MD 12/30/2023 11:54 AM EDT RP Workstation: [id number]    Cardiac Studies   12/30/23: TTE  1. Left ventricular ejection fraction, by estimation, is 60 to 65%. The  left ventricle has normal function. The left ventricle has no regional  wall motion abnormalities. There is mild left ventricular hypertrophy.  Left ventricular diastolic parameters  are indeterminate.   2. Right ventricular  systolic function was not well visualized. The right  ventricular size is not well visualized. There is moderately elevated  pulmonary artery systolic pressure.   3. Left atrial size was moderately dilated.   4. The mitral valve is normal in structure. Trivial mitral valve  regurgitation. No evidence of mitral stenosis.   5. The tricuspid valve is abnormal. Tricuspid valve regurgitation is  moderate.   6. The aortic valve is tricuspid. There is mild calcification of the  aortic valve. There is mild thickening of the aortic valve. Aortic valve  regurgitation is not visualized. No aortic stenosis is present.   7. The inferior vena cava is dilated in size with >50% respiratory  variability, suggesting right atrial pressure of 8 mmHg.     Patient Profile   83 y.o. female w/PMHx of  Depression, HTN, CKD (IIIa) Syncope  Admitted via EMS after being found on the floor by family/reports of agonal breathning observed >> in the ER found in Afib Initial w/u unremarkable Here had a syncopal event on her way back from using the bathroom with HRs 20's   Assessment & Plan   Malignant vaso-vagal syncope Recommended PPM > planned for this afternoon S/p PPM 12/01/23  Site is stable  Device check this morning with stable measurements CXR this morning without PTX Wound care and activity instructions reviewed with the patient and provided in her AVS EP follow up is in place   2. AFib  CHA2DS2Vasc is 4 > new diagnosis for her Unknown duration No plans for DCCV this admission will look to ~ 3 mo post  implant most likely after her 3 mo follow up visit Do not start OAC (recommend Eliquis) until  01/07/24 Toprol  25mg  BID started > continue at discharge Rates OK ~ 90's-100s    Signed, Debbie Fails, PA-C  01/02/2024, 10:53 AM

## 2024-01-03 ENCOUNTER — Inpatient Hospital Stay (HOSPITAL_COMMUNITY)

## 2024-01-03 ENCOUNTER — Encounter: Payer: Self-pay | Admitting: Emergency Medicine

## 2024-01-03 DIAGNOSIS — R569 Unspecified convulsions: Secondary | ICD-10-CM | POA: Diagnosis not present

## 2024-01-03 DIAGNOSIS — I4891 Unspecified atrial fibrillation: Secondary | ICD-10-CM | POA: Diagnosis not present

## 2024-01-03 LAB — BASIC METABOLIC PANEL WITH GFR
Anion gap: 10 (ref 5–15)
BUN: 17 mg/dL (ref 8–23)
CO2: 26 mmol/L (ref 22–32)
Calcium: 8.6 mg/dL — ABNORMAL LOW (ref 8.9–10.3)
Chloride: 101 mmol/L (ref 98–111)
Creatinine, Ser: 0.8 mg/dL (ref 0.44–1.00)
GFR, Estimated: >60 mL/min (ref >60)
Glucose, Bld: 106 mg/dL — ABNORMAL HIGH (ref 70–99)
Potassium: 3.5 mmol/L (ref 3.5–5.1)
Sodium: 137 mmol/L (ref 135–145)

## 2024-01-03 LAB — CBC
HCT: 30.3 % — ABNORMAL LOW (ref 36.0–46.0)
Hemoglobin: 9.9 g/dL — ABNORMAL LOW (ref 12.0–15.0)
MCH: 30.1 pg (ref 26.0–34.0)
MCHC: 32.7 g/dL (ref 30.0–36.0)
MCV: 92.1 fL (ref 80.0–100.0)
Platelets: 155 10*3/uL (ref 150–400)
RBC: 3.29 MIL/uL — ABNORMAL LOW (ref 3.87–5.11)
RDW: 14.4 % (ref 11.5–15.5)
WBC: 7.4 10*3/uL (ref 4.0–10.5)
nRBC: 0 % (ref 0.0–0.2)

## 2024-01-03 MED ORDER — OXYCODONE HCL 5 MG PO TABS
5.0000 mg | ORAL_TABLET | ORAL | Status: DC | PRN
  Administered 2024-01-03 – 2024-01-04 (×3): 5 mg via ORAL
  Filled 2024-01-03 (×3): qty 1

## 2024-01-03 MED FILL — Gentamicin Sulfate Inj 40 MG/ML: INTRAMUSCULAR | Qty: 80 | Status: AC

## 2024-01-03 MED FILL — Midazolam HCl Inj 2 MG/2ML (Base Equivalent): INTRAMUSCULAR | Qty: 0.5 | Status: AC

## 2024-01-03 NOTE — Progress Notes (Signed)
 Occupational Therapy Treatment Patient Details Name: Barbara Soto MRN: [id number] DOB: [birth year] Today's Date: 01/03/2024   History of present illness 82y brought to the ED by EMS after found down at the bottom of 4 steps with face down in the dirt unconscious. Admitted for treatment of syncope and Afib with RVR, Pt coded on 2024-01-17 requiring CPR for a few seconds before ROSC, s/p 6/2 PPM placement   PMH: recent L ankle injury from a fall, depression, hypertension   OT comments  Patient received up in recliner with L ankle ACE wrapped. Patient making good gains with mobility and functional transfer with min assist and cues for LUE restrictions and safety. Patient able to stand at sink for grooming tasks CGA for safety. Patient continues to require mod assist for LB dressing. Discharge recommendations continue to be appropriate. Acute OT to continue to follow to address established goals.       If plan is discharge home, recommend the following:  A little help with walking and/or transfers;A little help with bathing/dressing/bathroom;Assistance with cooking/housework;Assist for transportation   Equipment Recommendations  None recommended by OT    Recommendations for Other Services      Precautions / Restrictions Precautions Precautions: Fall;ICD/Pacemaker Required Braces or Orthoses: Sling;Other Brace Other Brace: ACE bandage for support of L ankle from prior sprain, secondary to pt inablity to tolerate walking boot or cast due to skin cancer on LE Restrictions Weight Bearing Restrictions Per Provider Order: Yes LUE Weight Bearing Per Provider Order: Non weight bearing Other Position/Activity Restrictions: PPM       Mobility Bed Mobility Overal bed mobility: Needs Assistance             General bed mobility comments: OOB in recliner    Transfers Overall transfer level: Needs assistance Equipment used: 1 person hand held assist Transfers: Sit to/from Stand Sit to Stand:  Min assist           General transfer comment: cues to prevent LUE use and HHA. Min assist to power up and for stability     Balance Overall balance assessment: Needs assistance Sitting-balance support: Feet supported, No upper extremity supported Sitting balance-Leahy Scale: Good     Standing balance support: Single extremity supported Standing balance-Leahy Scale: Poor Standing balance comment: required one extremity support for balance                           ADL either performed or assessed with clinical judgement   ADL Overall ADL's : Needs assistance/impaired     Grooming: Contact guard assist;Standing;Wash/dry hands;Wash/dry face;Oral care Grooming Details (indicate cue type and reason): stood at sink with CGA for safety and history of falls             Lower Body Dressing: Moderate assistance Lower Body Dressing Details (indicate cue type and reason): with doffing and donning socks with mod assist due to LUE restrictions Toilet Transfer: Minimal assistance;Ambulation           Functional mobility during ADLs: Minimal assistance General ADL Comments: performed transfer training with one person HHA due to LUE restrictions with min assist to power up and for stability    Extremity/Trunk Assessment              Vision       Perception     Praxis     Communication Communication Communication: No apparent difficulties   Cognition Arousal: Alert Behavior During Therapy: [facility] for  tasks assessed/performed               OT - Cognition Comments: requires cues for safety and NWB on LUE                 Following commands: Intact        Cueing   Cueing Techniques: Verbal cues, Tactile cues, Visual cues  Exercises      Shoulder Instructions       General Comments Patient feeling nauseous at end of session but quickly subsided    Pertinent Vitals/ Pain       Pain Assessment Pain Assessment: No/denies pain Breathing:  normal  Home Living                                          Prior Functioning/Environment              Frequency  Min 2X/week        Progress Toward Goals  OT Goals(current goals can now be found in the care plan section)  Progress towards OT goals: Progressing toward goals  Acute Rehab OT Goals Patient Stated Goal: to go home OT Goal Formulation: With patient Time For Goal Achievement: 01/16/24 Potential to Achieve Goals: Good ADL Goals Pt Will Perform Grooming: with contact guard assist;standing Pt Will Perform Lower Body Dressing: with contact guard assist;sit to/from stand;with adaptive equipment Pt Will Transfer to Toilet: with supervision;ambulating Additional ADL Goal #1: Pt will complete bed mobility at supervision level in preparation for ADL/functional mobility.  Plan      Co-evaluation                 AM-PAC OT "6 Clicks" Daily Activity     Outcome Measure   Help from another person eating meals?: A Little Help from another person taking care of personal grooming?: A Little Help from another person toileting, which includes using toliet, bedpan, or urinal?: A Little Help from another person bathing (including washing, rinsing, drying)?: A Little Help from another person to put on and taking off regular upper body clothing?: A Little Help from another person to put on and taking off regular lower body clothing?: A Little 6 Click Score: 18    End of Session Equipment Utilized During Treatment: Gait belt  OT Visit Diagnosis: Other abnormalities of gait and mobility (R26.89);Muscle weakness (generalized) (M62.81)   Activity Tolerance Patient tolerated treatment well   Patient Left in chair;with call bell/phone within reach;with chair alarm set;with family/visitor present   Nurse Communication Mobility status        Time: 1347-1416 OT Time Calculation (min): 29 min  Charges: OT General Charges $OT Visit: 1 Visit OT  Treatments $Self Care/Home Management : 23-37 mins  Anitra Barn, OTA Acute Rehabilitation Services  Office [phone]   Jovita Nipper 01/03/2024, 2:35 PM

## 2024-01-03 NOTE — TOC Progression Note (Signed)
 Transition of Care [facility]) - Progression Note    Patient Details  Name: Barbara Soto MRN: [id number] Date of Birth: [birth year]  [facility]) CM/SW Contact  Graves-Bigelow, Jari Merles, RN Phone Number: 01/03/2024, 11:57 AM  Clinical Narrative: Case Manager spoke with patient and daughter regarding home health services. Both have no agency preference-referral submitted to [place] and they can accept the patient for PT/OT/RN. Start of care to begin within 24-48 hours post transition home. Daughter to provide transportation home once stable. No further needs identified at this time.     Expected Discharge Plan: Home w Home Health Services Barriers to Discharge: No Barriers Identified  Expected Discharge Plan and Services In-house Referral: NA Discharge Planning Services: CM Consult Post Acute Care Choice: Home Health Living arrangements for the past 2 months: Single Family Home Expected Discharge Date: 01/02/24                HH Arranged: RN, PT, OT, Disease Management HH Agency: [facility] Date [facility] Agency Contacted: 01/03/24 Time HH Agency Contacted: 1138 Representative spoke with at [facility] Agency: Amy  Social Determinants of Health (SDOH) Interventions SDOH Screenings   Food Insecurity: No Food Insecurity (12/31/2023)  Housing: High Risk (12/31/2023)  Transportation Needs: No Transportation Needs (12/31/2023)  Utilities: Not At Risk (12/31/2023)  Social Connections: Unknown (01/02/2024)    Readmission Risk Interventions     No data to display

## 2024-01-03 NOTE — Progress Notes (Signed)
 Mobility Specialist Progress Note;   01/03/24 1143  Mobility  Activity Ambulated with assistance in room;Transferred from bed to chair  Level of Assistance Minimal assist, patient does 75% or more  Assistive Device Front wheel walker  Distance Ambulated (ft) 20 ft  LUE Weight Bearing Per Provider Order NWB  Activity Response Tolerated fair  Mobility Referral Yes  Mobility visit 1 Mobility  Mobility Specialist Start Time (ACUTE ONLY) 1143  Mobility Specialist Stop Time (ACUTE ONLY) 1208  Mobility Specialist Time Calculation (min) (ACUTE ONLY) 25 min   Pt agreeable to mobility, taking things slow and cautious d/t episodes yesterday afternoon. Required MinA during ambulation and VC for safety avoiding objects in room and proper use of RW. Pt requested assistance to BR once up, void successful. C/o feeling nauseas on toilet, MS and daughter by side for safety, Pt stated the feeling recovered within a few minutes and felt safe to ambulate to chair. Pt left sitting in chair with all needs met, alarm on. Call bell in reach.   Janit Meline Mobility Specialist Please contact via SecureChat or [employer] [phone]

## 2024-01-03 NOTE — Plan of Care (Signed)
  Problem: Education: Goal: Ability to safely manage health related needs after discharge will improve Outcome: Progressing

## 2024-01-03 NOTE — Progress Notes (Signed)
Routine EEG complete. Results pending.

## 2024-01-03 NOTE — Progress Notes (Signed)
 PROGRESS NOTE  Barbara Soto ZOX:[id number] DOB: [birth year] DOA: 12/29/2023 PCP: Windell Hasty, DO   LOS: 3 days   Brief Narrative / Interim history: 83 year old female with HTN, depression was brought into the hospital after syncopal episode.  Imaging in the ER was without acute findings, including a CT scan of the head and neck as well as chest abdomen pelvis.  She was found to be in A-fib, new onset, with RVR.  Hospital course complicated by cardiac arrest while in house status post CPR.  Cardiology consulted, EP evaluated patient and underwent pacemaker placement on 6/2 due to symptomatic bradycardia/cardiac arrest  Subjective / 24h Interval events: Complains of significant chest discomfort this morning, unable to take a deep breath.  Feels like current pain regimen is not working.  Has had 2 more episodes of presyncope symptoms while getting up and walking yesterday morning as well as evening.  Assesement and Plan: Principal problem Cardiac arrest with pauses, while hospitalized -possibly malignant vasovagal syncope with long pauses.  Heart rate was less than 20s needing chest compressions and code activation 5/31.  She had an episode of syncope at home prior to admission.  Cardiology consulted, underwent PPM implantation by EP on 6/2 - Continue pain management with lidocaine patch as well as chest discomfort post CPR.  Given lack of improvement on tramadol, started oxycodone this morning  Active problems Recurrent presyncopal episodes-happened twice yesterday, discharge was canceled.  Apparently started groaning, having agonal breathing and minimally responsive while having a bowel movement.?  Persistent vasovagal episodes with fluctuations in blood pressure - Discussed with EP, telemetry reviewed and there are no cardiac causes for this - Obtain orthostatics - Obtain CT scan of the brain, EEG.  New onset A-fib-2D echocardiogram shows LVEF 60-65%, TSH was unremarkable.  She now has a  pacemaker and is on metoprolol .  Anticoagulation is on hold, per cardiology, until 01/07/2024.  Acute kidney injury on CKD 3A -creatinine returned to baseline  Hypokalemia-continue to monitor and replace as indicated  Normocytic anemia-monitor, no bleeding, fecal occult is negative.  Slight hemoglobin drop was likely dilutional  Recent left ankle injury -follows with orthopedics, supposed to have a cast placed after skin cancer removed from her left leg.  Weight-bear as tolerated  Obesity, morbid-BMI 41.  She would benefit from weight loss  Scheduled Meds:  acetaminophen   1,000 mg Oral TID   amLODipine  5 mg Oral Daily   lidocaine  1 patch Transdermal Q24H   metoprolol  succinate  25 mg Oral BID   sertraline  25 mg Oral Daily   vitamin B-12  500 mcg Oral Daily   Continuous Infusions:  sodium chloride  Stopped (01/02/24 1921)   PRN Meds:.acetaminophen , hydrALAZINE, methocarbamol, ondansetron  **OR** ondansetron  (ZOFRAN ) IV, oxyCODONE  Current Outpatient Medications  Medication Instructions   amLODipine (NORVASC) 5 mg, Oral, Daily   aspirin EC 81 mg, Oral, Daily, Swallow whole.   [START ON 01/07/2024] Eliquis 5 mg, Oral, 2 times daily   furosemide (LASIX) 20 mg, Oral   lisinopril (ZESTRIL) 10 mg, Oral, Daily   metoprolol  succinate (TOPROL -XL) 25 mg, Oral, 2 times daily   sertraline (ZOLOFT) 25 mg, Oral, Daily    Diet Orders (From admission, onward)     Start     Ordered   01/01/24 1942  Diet Heart Room service appropriate? Yes; Fluid consistency: Thin  Diet effective now       Question Answer Comment  Room service appropriate? Yes   Fluid consistency: Thin  01/01/24 1941            DVT prophylaxis: Place and maintain sequential compression device Start: 01/01/24 0705   Lab Results  Component Value Date   PLT 155 01/03/2024      Code Status: Full Code  Family Communication: Daughter at bedside  Status is: Inpatient Remains inpatient appropriate because:  Persistent symptoms   Level of care: Telemetry Cardiac  Consultants:  Cardiology/EP  Objective: Vitals:   01/03/24 0135 01/03/24 0140 01/03/24 0405 01/03/24 0753  BP:   (!) 135/92   Pulse: (!) 101 92 (!) 58   Resp: 18 (!) 24 (!) 25   Temp:      TempSrc:    Oral  SpO2: 96% 97% 97%   Weight:      Height:        Intake/Output Summary (Last 24 hours) at 01/03/2024 0912 Last data filed at 01/03/2024 0433 Gross per 24 hour  Intake 287.36 ml  Output --  Net 287.36 ml   Wt Readings from Last 3 Encounters:  12/29/23 99.4 kg    Examination:  Constitutional: NAD Eyes: no scleral icterus ENMT: Mucous membranes are moist.  Neck: normal, supple Respiratory: clear to auscultation bilaterally, no wheezing, no crackles.  Cardiovascular: Regular rate and rhythm, no murmurs / rubs / gallops.  No edema Abdomen: non distended, no tenderness. Bowel sounds positive.  Musculoskeletal: no clubbing / cyanosis.   Data Reviewed: I have independently reviewed following labs and imaging studies   CBC Recent Labs  Lab 12/30/23 0445 12/31/23 0447 01/01/24 0518 01/02/24 0547 01/03/24 0400  WBC 6.0 6.9 6.1 6.2 7.4  HGB 9.7* 9.8* 9.7* 9.9* 9.9*  HCT 30.6* 30.7* 30.7* 29.9* 30.3*  PLT 174 163 140* 141* 155  MCV 91.6 92.7 93.0 91.4 92.1  MCH 29.0 29.6 29.4 30.3 30.1  MCHC 31.7 31.9 31.6 33.1 32.7  RDW 14.5 14.4 14.3 14.1 14.4    Recent Labs  Lab 12/29/23 1337 12/29/23 1342 12/29/23 1634 12/30/23 0445 12/30/23 1952 12/31/23 0447 01/01/24 0518 01/02/24 0547 01/03/24 0400  NA 139 139  --    < > 139 138 136 138 137  K 3.9 3.7  --    < > 4.2 3.9 3.4* 3.7 3.5  CL 102 102  --    < > 103 104 102 103 101  CO2 24  --   --    < > 27 26 27 29 26   GLUCOSE 132* 132*  --    < > 135* 121* 94 95 106*  BUN 16 18  --    < > 13 13 10 14 17   CREATININE 1.21* 1.30*  --    < > 1.21* 1.10* 0.93 0.86 0.80  CALCIUM 8.6*  --   --    < > 8.5* 8.1* 8.1* 8.5* 8.6*  AST 30  --   --   --  30  --   --   --    --   ALT 14  --   --   --  15  --   --   --   --   ALKPHOS 85  --   --   --  75  --   --   --   --   BILITOT 0.9  --   --   --  1.4*  --   --   --   --   ALBUMIN 3.7  --   --   --  3.4*  --   --   --   --  MG  --   --   --   --  1.9  --   --   --   --   LATICACIDVEN  --  1.8  --   --  1.4  --   --   --   --   INR 1.2  --   --   --   --   --   --   --   --   TSH  --   --  0.656  --   --   --   --   --   --   BNP 182.3*  --   --   --   --   --   --   --   --    < > = values in this interval not displayed.    ------------------------------------------------------------------------------------------------------------------ No results for input(s): "CHOL", "HDL", "LDLCALC", "TRIG", "CHOLHDL", "LDLDIRECT" in the last 72 hours.  No results found for: "HGBA1C" ------------------------------------------------------------------------------------------------------------------ No results for input(s): "TSH", "T4TOTAL", "T3FREE", "THYROIDAB" in the last 72 hours.  Invalid input(s): "FREET3"  Cardiac Enzymes No results for input(s): "CKMB", "TROPONINI", "MYOGLOBIN" in the last 168 hours.  Invalid input(s): "CK" ------------------------------------------------------------------------------------------------------------------    Component Value Date/Time   BNP 182.3 (H) 12/29/2023 1337    CBG: Recent Labs  Lab 12/30/23 1847  GLUCAP 129*    Recent Results (from the past 240 hours)  Surgical PCR screen     Status: None   Collection Time: 12/31/23  8:40 AM   Specimen: Nasal Mucosa; Nasal Swab  Result Value Ref Range Status   MRSA, PCR NEGATIVE NEGATIVE Final   Staphylococcus aureus NEGATIVE NEGATIVE Final    Comment: (NOTE) The Xpert SA Assay (FDA approved for NASAL specimens in patients 9 years of age and older), is one component of a comprehensive surveillance program. It is not intended to diagnose infection nor to guide or monitor treatment. Performed at [facility],  [address], [place], [place] [address]      Radiology Studies: No results found.   Kathlen Para, MD, PhD [facility]  Between 7 am - 7 pm I am available, please contact me via Amion (for emergencies) or Securechat (non urgent messages)  Between 7 pm - 7 am I am not available, please contact night coverage MD/APP via Amion

## 2024-01-03 NOTE — Procedures (Signed)
 Patient Name: Barbara Soto  MRN: [id number]  Epilepsy Attending: Arleene Lack  Referring Physician/Provider: Osborn Blaze, MD  Date: 01/03/2024 Duration: 24.09 mins  Patient history: 83yo F s/p cardiac arrest. EEG to evaluate for seizure  Level of alertness: Awake  AEDs during EEG study: None  Technical aspects: This EEG study was done with scalp electrodes positioned according to the 10-20 International system of electrode placement. Electrical activity was reviewed with band pass filter of 1-70Hz , sensitivity of 7 uV/mm, display speed of 23mm/sec with a 60Hz  notched filter applied as appropriate. EEG data were recorded continuously and digitally stored.  Video monitoring was available and reviewed as appropriate.  Description: The posterior dominant rhythm consists of 8 Hz activity of moderate voltage (25-35 uV) seen predominantly in posterior head regions, symmetric and reactive to eye opening and eye closing. Hyperventilation and photic stimulation were not performed.     IMPRESSION: This study is within normal limits. No seizures or epileptiform discharges were seen throughout the recording.  A normal interictal EEG does not exclude the diagnosis of epilepsy.   Mileigh Tilley O Tama Grosz

## 2024-01-04 ENCOUNTER — Inpatient Hospital Stay (HOSPITAL_COMMUNITY)
Admission: EM | Admit: 2024-01-04 | Discharge: 2024-01-10 | Disposition: A | Discharge status: Home Health | Attending: Internal Medicine | Admitting: Internal Medicine

## 2024-01-04 ENCOUNTER — Emergency Department (HOSPITAL_COMMUNITY)

## 2024-01-04 ENCOUNTER — Other Ambulatory Visit (HOSPITAL_COMMUNITY): Payer: Self-pay

## 2024-01-04 ENCOUNTER — Encounter (HOSPITAL_COMMUNITY): Payer: Self-pay | Admitting: Internal Medicine

## 2024-01-04 ENCOUNTER — Telehealth (HOSPITAL_COMMUNITY): Payer: Self-pay | Admitting: Pharmacy Technician

## 2024-01-04 DIAGNOSIS — S99912D Unspecified injury of left ankle, subsequent encounter: Secondary | ICD-10-CM

## 2024-01-04 DIAGNOSIS — Z8674 Personal history of sudden cardiac arrest: Secondary | ICD-10-CM

## 2024-01-04 DIAGNOSIS — Z7901 Long term (current) use of anticoagulants: Secondary | ICD-10-CM

## 2024-01-04 DIAGNOSIS — D631 Anemia in chronic kidney disease: Secondary | ICD-10-CM | POA: Diagnosis present

## 2024-01-04 DIAGNOSIS — I129 Hypertensive chronic kidney disease with stage 1 through stage 4 chronic kidney disease, or unspecified chronic kidney disease: Secondary | ICD-10-CM | POA: Diagnosis present

## 2024-01-04 DIAGNOSIS — I444 Left anterior fascicular block: Secondary | ICD-10-CM | POA: Diagnosis present

## 2024-01-04 DIAGNOSIS — Z950 Presence of cardiac pacemaker: Secondary | ICD-10-CM

## 2024-01-04 DIAGNOSIS — R55 Syncope and collapse: Principal | ICD-10-CM | POA: Diagnosis present

## 2024-01-04 DIAGNOSIS — Z23 Encounter for immunization: Secondary | ICD-10-CM | POA: Diagnosis present

## 2024-01-04 DIAGNOSIS — Z6841 Body Mass Index (BMI) 40.0 and over, adult: Secondary | ICD-10-CM

## 2024-01-04 DIAGNOSIS — R1110 Vomiting, unspecified: Secondary | ICD-10-CM | POA: Diagnosis present

## 2024-01-04 DIAGNOSIS — X58XXXD Exposure to other specified factors, subsequent encounter: Secondary | ICD-10-CM | POA: Diagnosis present

## 2024-01-04 DIAGNOSIS — I4891 Unspecified atrial fibrillation: Secondary | ICD-10-CM

## 2024-01-04 DIAGNOSIS — E1122 Type 2 diabetes mellitus with diabetic chronic kidney disease: Secondary | ICD-10-CM | POA: Diagnosis present

## 2024-01-04 DIAGNOSIS — Z79899 Other long term (current) drug therapy: Secondary | ICD-10-CM

## 2024-01-04 DIAGNOSIS — J9811 Atelectasis: Secondary | ICD-10-CM | POA: Diagnosis present

## 2024-01-04 DIAGNOSIS — W109XXA Fall (on) (from) unspecified stairs and steps, initial encounter: Secondary | ICD-10-CM | POA: Diagnosis not present

## 2024-01-04 DIAGNOSIS — Z91040 Latex allergy status: Secondary | ICD-10-CM

## 2024-01-04 DIAGNOSIS — Z713 Dietary counseling and surveillance: Secondary | ICD-10-CM

## 2024-01-04 DIAGNOSIS — Z880 Allergy status to penicillin: Secondary | ICD-10-CM

## 2024-01-04 DIAGNOSIS — Z45018 Encounter for adjustment and management of other part of cardiac pacemaker: Secondary | ICD-10-CM

## 2024-01-04 DIAGNOSIS — I482 Chronic atrial fibrillation, unspecified: Secondary | ICD-10-CM | POA: Diagnosis present

## 2024-01-04 DIAGNOSIS — N1831 Chronic kidney disease, stage 3a: Secondary | ICD-10-CM | POA: Diagnosis present

## 2024-01-04 DIAGNOSIS — I4819 Other persistent atrial fibrillation: Secondary | ICD-10-CM | POA: Diagnosis present

## 2024-01-04 LAB — BASIC METABOLIC PANEL WITH GFR
Anion gap: 12 (ref 5–15)
BUN: 24 mg/dL — ABNORMAL HIGH (ref 8–23)
CO2: 24 mmol/L (ref 22–32)
Calcium: 8.6 mg/dL — ABNORMAL LOW (ref 8.9–10.3)
Chloride: 101 mmol/L (ref 98–111)
Creatinine, Ser: 0.97 mg/dL (ref 0.44–1.00)
GFR, Estimated: 59 mL/min — ABNORMAL LOW (ref >60)
Glucose, Bld: 146 mg/dL — ABNORMAL HIGH (ref 70–99)
Potassium: 3.6 mmol/L (ref 3.5–5.1)
Sodium: 137 mmol/L (ref 135–145)

## 2024-01-04 LAB — CBC
HCT: 31.2 % — ABNORMAL LOW (ref 36.0–46.0)
HCT: 33.5 % — ABNORMAL LOW (ref 36.0–46.0)
HCT: 30.9 % — ABNORMAL LOW (ref 36.0–46.0)
Hemoglobin: 10 g/dL — ABNORMAL LOW (ref 12.0–15.0)
Hemoglobin: 10.4 g/dL — ABNORMAL LOW (ref 12.0–15.0)
Hemoglobin: 9.7 g/dL — ABNORMAL LOW (ref 12.0–15.0)
MCH: 29.7 pg (ref 26.0–34.0)
MCH: 28.9 pg (ref 26.0–34.0)
MCH: 29.5 pg (ref 26.0–34.0)
MCHC: 32.1 g/dL (ref 30.0–36.0)
MCHC: 31 g/dL (ref 30.0–36.0)
MCHC: 31.4 g/dL (ref 30.0–36.0)
MCV: 92.6 fL (ref 80.0–100.0)
MCV: 93.1 fL (ref 80.0–100.0)
MCV: 93.9 fL (ref 80.0–100.0)
Platelets: 190 10*3/uL (ref 150–400)
Platelets: 188 10*3/uL (ref 150–400)
Platelets: 164 10*3/uL (ref 150–400)
RBC: 3.37 MIL/uL — ABNORMAL LOW (ref 3.87–5.11)
RBC: 3.6 MIL/uL — ABNORMAL LOW (ref 3.87–5.11)
RBC: 3.29 MIL/uL — ABNORMAL LOW (ref 3.87–5.11)
RDW: 14.8 % (ref 11.5–15.5)
RDW: 14.8 % (ref 11.5–15.5)
RDW: 14.9 % (ref 11.5–15.5)
WBC: 8.2 10*3/uL (ref 4.0–10.5)
WBC: 8.1 10*3/uL (ref 4.0–10.5)
WBC: 7.3 10*3/uL (ref 4.0–10.5)
nRBC: 0 % (ref 0.0–0.2)
nRBC: 0 % (ref 0.0–0.2)
nRBC: 0 % (ref 0.0–0.2)

## 2024-01-04 LAB — COMPREHENSIVE METABOLIC PANEL WITH GFR
ALT: 13 U/L (ref 0–44)
AST: 24 U/L (ref 15–41)
Albumin: 3.2 g/dL — ABNORMAL LOW (ref 3.5–5.0)
Alkaline Phosphatase: 66 U/L (ref 38–126)
Anion gap: 9 (ref 5–15)
BUN: 20 mg/dL (ref 8–23)
CO2: 26 mmol/L (ref 22–32)
Calcium: 8.7 mg/dL — ABNORMAL LOW (ref 8.9–10.3)
Chloride: 103 mmol/L (ref 98–111)
Creatinine, Ser: 1.01 mg/dL — ABNORMAL HIGH (ref 0.44–1.00)
GFR, Estimated: 56 mL/min — ABNORMAL LOW (ref >60)
Glucose, Bld: 120 mg/dL — ABNORMAL HIGH (ref 70–99)
Potassium: 3.6 mmol/L (ref 3.5–5.1)
Sodium: 138 mmol/L (ref 135–145)
Total Bilirubin: 1.7 mg/dL — ABNORMAL HIGH (ref 0.0–1.2)
Total Protein: 6.5 g/dL (ref 6.5–8.1)

## 2024-01-04 LAB — PHOSPHORUS: Phosphorus: 2.9 mg/dL (ref 2.5–4.6)

## 2024-01-04 LAB — BRAIN NATRIURETIC PEPTIDE: B Natriuretic Peptide: 244.1 pg/mL — ABNORMAL HIGH (ref 0.0–100.0)

## 2024-01-04 LAB — MAGNESIUM
Magnesium: 1.8 mg/dL (ref 1.7–2.4)
Magnesium: 1.9 mg/dL (ref 1.7–2.4)

## 2024-01-04 LAB — CREATININE, SERUM
Creatinine, Ser: 0.94 mg/dL (ref 0.44–1.00)
GFR, Estimated: >60 mL/min (ref >60)

## 2024-01-04 LAB — TROPONIN I (HIGH SENSITIVITY)
Troponin I (High Sensitivity): 21 ng/L — ABNORMAL HIGH (ref <18)
Troponin I (High Sensitivity): 18 ng/L — ABNORMAL HIGH (ref <18)

## 2024-01-04 MED ORDER — LIDOCAINE 5 % EX PTCH
1.0000 | MEDICATED_PATCH | CUTANEOUS | 0 refills | Status: AC
  Filled 2024-01-04: qty 20, 20d supply, fill #0

## 2024-01-04 MED ORDER — ACETAMINOPHEN 325 MG PO TABS
650.0000 mg | ORAL_TABLET | Freq: Four times a day (QID) | ORAL | Status: DC | PRN
  Administered 2024-01-07 – 2024-01-08 (×3): 650 mg via ORAL
  Filled 2024-01-04 (×3): qty 2

## 2024-01-04 MED ORDER — ONDANSETRON HCL 4 MG PO TABS: 4.0000 mg | ORAL_TABLET | Freq: Four times a day (QID) | ORAL | Status: DC | PRN

## 2024-01-04 MED ORDER — ACETAMINOPHEN 650 MG RE SUPP: 650.0000 mg | Freq: Four times a day (QID) | RECTAL | Status: DC | PRN

## 2024-01-04 MED ORDER — MAGNESIUM SULFATE 2 GM/50ML IV SOLN
2.0000 g | Freq: Once | INTRAVENOUS | Status: AC
  Administered 2024-01-04: 2 g via INTRAVENOUS
  Filled 2024-01-04: qty 50

## 2024-01-04 MED ORDER — LACTATED RINGERS IV BOLUS
1000.0000 mL | Freq: Once | INTRAVENOUS | Status: AC
  Administered 2024-01-04: 1000 mL via INTRAVENOUS

## 2024-01-04 MED ORDER — METOPROLOL SUCCINATE ER 25 MG PO TB24
25.0000 mg | ORAL_TABLET | Freq: Two times a day (BID) | ORAL | Status: DC
  Administered 2024-01-04 – 2024-01-10 (×12): 25 mg via ORAL
  Filled 2024-01-04 (×12): qty 1

## 2024-01-04 MED ORDER — SERTRALINE HCL 50 MG PO TABS
25.0000 mg | ORAL_TABLET | Freq: Every day | ORAL | Status: DC
  Administered 2024-01-05 – 2024-01-10 (×6): 25 mg via ORAL
  Filled 2024-01-04 (×6): qty 1

## 2024-01-04 MED ORDER — POTASSIUM CHLORIDE CRYS ER 20 MEQ PO TBCR
40.0000 meq | EXTENDED_RELEASE_TABLET | Freq: Once | ORAL | Status: AC
  Administered 2024-01-04: 40 meq via ORAL
  Filled 2024-01-04: qty 2

## 2024-01-04 MED ORDER — OXYCODONE HCL 5 MG PO TABS
5.0000 mg | ORAL_TABLET | Freq: Four times a day (QID) | ORAL | Status: DC | PRN
  Administered 2024-01-04 – 2024-01-10 (×11): 5 mg via ORAL
  Filled 2024-01-04 (×11): qty 1

## 2024-01-04 MED ORDER — ONDANSETRON HCL 4 MG/2ML IJ SOLN
4.0000 mg | Freq: Four times a day (QID) | INTRAMUSCULAR | Status: DC | PRN
Start: 2024-01-04 — End: 2024-01-10

## 2024-01-04 MED ORDER — ORAL CARE MOUTH RINSE: 15.0000 mL | OROMUCOSAL | Status: DC | PRN

## 2024-01-04 MED ORDER — OXYCODONE HCL 5 MG PO TABS
5.0000 mg | ORAL_TABLET | Freq: Four times a day (QID) | ORAL | 0 refills | Status: AC | PRN
  Filled 2024-01-04: qty 20, 5d supply, fill #0

## 2024-01-04 MED ORDER — ENOXAPARIN SODIUM 40 MG/0.4ML IJ SOSY
40.0000 mg | PREFILLED_SYRINGE | INTRAMUSCULAR | Status: DC
Start: 2024-01-04 — End: 2024-01-09
  Administered 2024-01-04 – 2024-01-08 (×5): 40 mg via SUBCUTANEOUS
  Filled 2024-01-04 (×5): qty 0.4

## 2024-01-04 NOTE — Progress Notes (Signed)
 Cardiology Consultation   Patient ID: LIVI MCGANN MRN: [id number]; DOB: 1940-09-21  Admit date: 01/04/2024 Date of Consult: 01/04/2024  PCP:  Windell Hasty, DO   [facility] Providers Cardiologist:  None      Patient Profile: Barbara Soto is a 83 y.o. female with a hx of malignant vasovagal syncope s/p PPM on 01/01/2024, Atrial fibrillation with episodic bradycardia, hypertension, moderate TR, CKD, morbid obesity and depression who is being seen 01/04/2024 for the evaluation of syncope at the request of [place].  History of Present Illness: [name] is a 83 year old female who presented to the ER on 12/29/2023 for syncope. In the hospital on 12/30/2023 at 1755 PM patient became unresponsive and was pulseless.  The nurse at the bedside started CPR.  When EP reviewed telemetry patient was in atrial fibrillation with a heart rate less than 20 bpm for 2 minutes.  Because of this patient received a Biotronic dual-chamber pacemaker. EP plans to start OAC on 01/07/2024, DCCV was recommended about 3 months post implant. The patient was discharged from the hospital on 01/04/24.   On interview patient was alert and orientated and knew what was going on.  She reported that she was back because of some syncopal like symptoms but denied falling.  Patient denies any chest pain, shortness of breath, diaphoresis.  While in the room the patient was complaining that she was feeling cold.  Called and talked with patient's daughter Barbara Soto who reported that she picked up the patient to take her home and upon walking from the car to the patient's chair the patient collapsed and her daughter was able to place her on her chair.  Patient reportedly became unconscious for about 2 minutes.  During this episode her arms became tense and the patient was shaking.  Her eyes rolled back in her head.  After waking up the patient was noncoherent and started vomiting.  After getting the patient back up it was  discovered that she had urinated on herself.  Because of this episode the patient's daughter decided to call 911 and get the patient back to the emergency room.  EKG shows atrial fibrillation with a rate of 98. Poor r wave progression.  EKG is similar to prior EKG on 01/02/2024. Chest x-ray shows Low lung volumes with mild to moderate severity left basilar atelectasis and/or infiltrate. Labs showed Normal potassium and creatinine of 0.97 GFR of 59.  Hemoglobin of 9.7  No past medical history on file.  Past Surgical History:  Procedure Laterality Date   PACEMAKER IMPLANT N/A 01/01/2024   Procedure: PACEMAKER IMPLANT;  Surgeon: Boyce Byes, MD;  Location: [facility] INVASIVE CV LAB;  Service: Cardiovascular;  Laterality: N/A;     Home Medications:  Prior to Admission medications   Medication Sig Start Date End Date Taking? Authorizing Provider  apixaban (ELIQUIS) 5 MG TABS tablet Take 1 tablet (5 mg total) by mouth 2 (two) times daily. 01/07/24   Lesa Rape, MD  furosemide (LASIX) 20 MG tablet Take 20 mg by mouth. 10/20/23   [provider]  lidocaine (LIDODERM) 5 % Place 1 patch onto the skin daily. Remove & Discard patch within 12 hours or as directed by MD 01/04/24   Osborn Blaze, MD  metoprolol  succinate (TOPROL -XL) 25 MG 24 hr tablet Take 1 tablet (25 mg total) by mouth 2 (two) times daily. 01/02/24 02/01/24  Lesa Rape, MD  oxyCODONE (OXY IR/ROXICODONE) 5 MG immediate release tablet Take 1 tablet (5 mg  total) by mouth every 6 (six) hours as needed for severe pain (pain score 7-10). 01/04/24   Gherghe, Costin M, MD  sertraline (ZOLOFT) 25 MG tablet Take 25 mg by mouth daily. 12/23/23   [provider]    Scheduled Meds:  potassium chloride  40 mEq Oral Once   Continuous Infusions:  lactated ringers     magnesium sulfate bolus IVPB     PRN Meds:   Allergies:    Allergies  Allergen Reactions   Latex Rash   Penicillins Other (See Comments)    -Pt stated that this  medication "does her no good"    Social History:   Social History   Socioeconomic History   Marital status: Widowed    Spouse name: Not on file   Number of children: Not on file   Years of education: Not on file   Highest education level: Not on file  Occupational History   Not on file  Tobacco Use   Smoking status: Never   Smokeless tobacco: Never  Vaping Use   Vaping status: Never Used  Substance and Sexual Activity   Alcohol use: Never   Drug use: Never   Sexual activity: Not Currently  Other Topics Concern   Not on file  Social History Narrative   Not on file   Social Drivers of Health   Financial Resource Strain: Not on file  Food Insecurity: No Food Insecurity (12/31/2023)   Hunger Vital Sign    Worried About Running Out of Food in the Last Year: Never true    Ran Out of Food in the Last Year: Never true  Transportation Needs: No Transportation Needs (12/31/2023)   PRAPARE - Administrator, Civil Service (Medical): No    Lack of Transportation (Non-Medical): No  Physical Activity: Not on file  Stress: Not on file  Social Connections: Unknown (01/02/2024)   Social Connection and Isolation Panel [NHANES]    Frequency of Communication with Friends and Family: More than three times a week    Frequency of Social Gatherings with Friends and Family: More than three times a week    Attends Religious Services: Patient declined    Database administrator or Organizations: Patient declined    Attends Banker Meetings: Patient declined    Marital Status: Patient declined  Intimate Partner Violence: Not At Risk (12/31/2023)   Humiliation, Afraid, Rape, and Kick questionnaire    Fear of Current or Ex-Partner: No    Emotionally Abused: No    Physically Abused: No    Sexually Abused: No    Family History:   No family history on file.   ROS:  Please see the history of present illness.   All other ROS reviewed and negative.     Physical  Exam/Data: Vitals:   01/04/24 1518 01/04/24 1519 01/04/24 1519 01/04/24 1520  BP:   (!) 141/76   Pulse: 90 96 99 97  Resp: (!) 22 (!) 21 20 20   Temp:   98.8 F (37.1 C)   TempSrc:   Oral   SpO2: 100% 100% 100% 100%  Weight:      Height:       No intake or output data in the 24 hours ending 01/04/24 1619    01/04/2024    3:16 PM 12/29/2023    7:01 PM 12/29/2023    1:48 PM  Last 3 Weights  Weight (lbs) 210 lb 219 lb 3.2 oz 212 lb  Weight (kg)  95.255 kg 99.428 kg 96.163 kg     Body mass index is 39.68 kg/m.  General:  Well nourished, well developed, in no acute distress HEENT: normal Neck: no JVD Vascular: No carotid bruits; Distal pulses 2+ bilaterally Cardiac:  normal S1, S2; RRR; no murmur  Lungs:  clear to auscultation bilaterally, no wheezing, rhonchi or rales  Abd: soft, nontender, no hepatomegaly  Ext: no edema Musculoskeletal:  No deformities, BUE and BLE strength normal and equal Skin: warm and dry  Neuro:  CNs 2-12 intact, no focal abnormalities noted Psych:  Normal affect   EKG:  The EKG was personally reviewed and demonstrates:  Atrial fibrillation with a rate of 98. Poor r wave progression. Telemetry:  Telemetry was personally reviewed and demonstrates: Atrial fibrillation with rates in the 90s and few PVCs  Relevant CV Studies: Echo from prior hospitalization  IMPRESSIONS     1. Left ventricular ejection fraction, by estimation, is 60 to 65%. The  left ventricle has normal function. The left ventricle has no regional  wall motion abnormalities. There is mild left ventricular hypertrophy.  Left ventricular diastolic parameters  are indeterminate.   2. Right ventricular systolic function was not well visualized. The right  ventricular size is not well visualized. There is moderately elevated  pulmonary artery systolic pressure.   3. Left atrial size was moderately dilated.   4. The mitral valve is normal in structure. Trivial mitral valve  regurgitation.  No evidence of mitral stenosis.   5. The tricuspid valve is abnormal. Tricuspid valve regurgitation is  moderate.   6. The aortic valve is tricuspid. There is mild calcification of the  aortic valve. There is mild thickening of the aortic valve. Aortic valve  regurgitation is not visualized. No aortic stenosis is present.   7. The inferior vena cava is dilated in size with >50% respiratory  variability, suggesting right atrial pressure of 8 mmHg.   Laboratory Data: High Sensitivity Troponin:   Recent Labs  Lab 12/29/23 1337 12/29/23 1634 12/30/23 1952 01/04/24 1515  TROPONINIHS 7 9 11  21*     Chemistry Recent Labs  Lab 12/30/23 1952 12/31/23 0447 01/03/24 0400 01/04/24 0421 01/04/24 1515  NA 139   < > 137 138 137  K 4.2   < > 3.5 3.6 3.6  CL 103   < > 101 103 101  CO2 27   < > 26 26 24   GLUCOSE 135*   < > 106* 120* 146*  BUN 13   < > 17 20 24*  CREATININE 1.21*   < > 0.80 1.01* 0.97  CALCIUM 8.5*   < > 8.6* 8.7* 8.6*  MG 1.9  --   --  1.8 1.9  GFRNONAA 45*   < > >60 56* 59*  ANIONGAP 9   < > 10 9 12    < > = values in this interval not displayed.    Recent Labs  Lab 12/29/23 1337 12/30/23 1952 01/04/24 0421  PROT 6.6 6.5 6.5  ALBUMIN 3.7 3.4* 3.2*  AST 30 30 24   ALT 14 15 13   ALKPHOS 85 75 66  BILITOT 0.9 1.4* 1.7*   Lipids No results for input(s): "CHOL", "TRIG", "HDL", "LABVLDL", "LDLCALC", "CHOLHDL" in the last 168 hours.  Hematology Recent Labs  Lab 01/03/24 0400 01/04/24 0421 01/04/24 1515  WBC 7.4 8.2 8.1  RBC 3.29* 3.37* 3.60*  HGB 9.9* 10.0* 10.4*  HCT 30.3* 31.2* 33.5*  MCV 92.1 92.6 93.1  MCH 30.1 29.7 28.9  MCHC 32.7 32.1 31.0  RDW 14.4 14.8 14.8  PLT 155 190 188   Thyroid  Recent Labs  Lab 12/29/23 1634  TSH 0.656    BNP Recent Labs  Lab 12/29/23 1337 01/04/24 1515  BNP 182.3* 244.1*    DDimer No results for input(s): "DDIMER" in the last 168 hours.  Radiology/Studies:  DG Chest Portable 1 View Result Date:  01/04/2024 CLINICAL DATA:  Syncope. EXAM: PORTABLE CHEST 1 VIEW COMPARISON:  January 02, 2024 FINDINGS: There is stable dual lead AICD positioning. The cardiac silhouette is mildly enlarged and unchanged in size. Low lung volumes are noted. Mild to moderate severity atelectasis and/or infiltrate is seen within the left lung base. No pleural effusion or pneumothorax is identified. The visualized skeletal structures are unremarkable. IMPRESSION: Low lung volumes with mild to moderate severity left basilar atelectasis and/or infiltrate. Electronically Signed   By: Virgle Grime M.D.   On: 01/04/2024 16:13   CT HEAD WO CONTRAST ( ) Result Date: 01/03/2024 CLINICAL DATA:  Syncope EXAM: CT HEAD WITHOUT CONTRAST TECHNIQUE: Contiguous axial images were obtained from the base of the skull through the vertex without intravenous contrast. RADIATION DOSE REDUCTION: This exam was performed according to the departmental dose-optimization program which includes automated exposure control, adjustment of the mA and/or kV according to patient size and/or use of iterative reconstruction technique. COMPARISON:  12/29/2023 FINDINGS: Brain: There is no mass, hemorrhage or extra-axial collection. The size and configuration of the ventricles and extra-axial CSF spaces are normal. There is hypoattenuation of the white matter, most commonly indicating chronic small vessel disease. Vascular: Atherosclerotic calcification of the vertebral and internal carotid arteries at the skull base. No abnormal hyperdensity of the major intracranial arteries or dural venous sinuses. Skull: Negative Sinuses/Orbits: Right frontal, maxillary and sphenoid sinus mucosal thickening Other: None. IMPRESSION: 1. No acute intracranial abnormality. 2. Chronic small vessel disease. 3. Right frontal, maxillary and sphenoid sinus mucosal thickening. Electronically Signed   By: Juanetta Nordmann M.D.   On: 01/03/2024 16:16   EEG adult Result Date: 01/03/2024 Arleene Lack, MD     01/03/2024 11:01 AM Patient Name: BRITTANNY LEVENHAGEN MRN: [id number] Epilepsy Attending: Arleene Lack Referring Physician/Provider: Osborn Blaze, MD Date: 01/03/2024 Duration: 24.09 mins Patient history: 83yo F s/p cardiac arrest. EEG to evaluate for seizure Level of alertness: Awake AEDs during EEG study: None Technical aspects: This EEG study was done with scalp electrodes positioned according to the 10-20 International system of electrode placement. Electrical activity was reviewed with band pass filter of 1-70Hz , sensitivity of 7 uV/mm, display speed of 25mm/sec with a 60Hz  notched filter applied as appropriate. EEG data were recorded continuously and digitally stored.  Video monitoring was available and reviewed as appropriate. Description: The posterior dominant rhythm consists of 8 Hz activity of moderate voltage (25-35 uV) seen predominantly in posterior head regions, symmetric and reactive to eye opening and eye closing. Hyperventilation and photic stimulation were not performed.   IMPRESSION: This study is within normal limits. No seizures or epileptiform discharges were seen throughout the recording. A normal interictal EEG does not exclude the diagnosis of epilepsy. Arleene Lack   DG Chest 2 View Result Date: 01/02/2024 CLINICAL DATA:  Status post pacemaker insertion EXAM: CHEST - 2 VIEW COMPARISON:  Chest radiograph dated 12/30/2023 FINDINGS: Lines/tubes: Left chest wall pacemaker leads project over the right atrium and ventricle. Lungs: Low lung volumes with bronchovascular crowding. Dense left basilar opacity. Pleura: Small bilateral pleural effusions.  No  pneumothorax. Heart/mediastinum: Similar enlarged cardiomediastinal silhouette. Bones: No acute osseous abnormality. IMPRESSION: 1. Left chest wall pacemaker leads project over the right atrium and ventricle. No pneumothorax. 2. Small bilateral pleural effusions. 3. Dense left basilar opacity, likely atelectasis.  Electronically Signed   By: Limin  Xu M.D.   On: 01/02/2024 08:35   EP PPM/ICD IMPLANT Result Date: 01/01/2024  CONCLUSIONS:  1.  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  Successful dual-chamber permanent pacemaker with left bundle area lead  3.  No early apparent complications.  4.  Hold anticoagulation for 5 days (okay to restart January 07, 2024)     Assessment and Plan: LADON HENEY is a 83 y.o. female with a hx of malignant vasovagal syncope s/p PPM on 01/01/2024, Atrial fibrillation with episodic bradycardia, hypertension, moderate TR, and depression who is being seen 01/04/2024 for the evaluation of syncope at the request of [facility].  Syncope Patient was discharged home from the hospital today.  At that hospitalization the patient received dual-chamber Biotronik pacemaker for malignant vasovagal syncope.  Upon going home the patient's daughter reported the patient collapsed while walking to her chair and her daughter was able to place her on her chair.  Patient reportedly became unconscious for about 2 minutes.  During this episode her arms became tense and the patient was shaking.  Her eyes rolled back in her head.  After waking up the patient was noncoherent and started vomiting.  After getting the patient back up it was discovered that she had urinated on herself.  Because of this episode the patient's daughter decided to call 911 and get the patient back to the emergency room.  - Biotronik pacemaker was interrogated does not appear to show any concerning arrhythmias but will discuss with MD. - The daughter's description of the patient passing out is concerning for seizure activity.  Recommend considering neuroconsult  Atrial fibrillation EP recommended starting DOAC on 01/07/2024 Continue metoprolol  for rate control  Possible aspiration pneumonia Chest x-ray showed  low lung volumes with mild to moderate severity left basilar atelectasis and/or infiltrate. With  the daughters report that the patient was vomiting after passing out there is concern for possible aspiration pneumonia. Management per primary    Risk Assessment/Risk Scores:         CHA2DS2-VASc Score = 4  } This indicates a 4.8% annual risk of stroke. The patient's score is based upon: CHF History: 0 HTN History: 1 Diabetes History: 0 Stroke History: 0 Vascular Disease History: 0 Age Score: 2 Gender Score: 1        For questions or updates, please contact [facility] Please consult [email or web address] for contact info under    Signed, Melita Springer, PA-C  01/04/2024 4:19 PM

## 2024-01-04 NOTE — Progress Notes (Signed)
 Discharge instructions reviewed with pt by Aida House, SWOT RN. Daughter coming to pick up pt. Pt dressed, was assisted by RN Thersia Flax towards the bathroom, NT Nellie Banas took over with assistance. Pt stopped in the doorway, and tried to sit down before getting over to the toilet, NT states pt had her eyes closed, then NT states she was able to get her over to the toilet but pt would not sit down, pt had her eyes open at this time.   Upon entering the room/bathroom by this nurse pt was standing in front of the toilet and staff was finally able to get her to sit down. Pt then said she was trying to get on toilet and use the bathroom. Pt voided, cleaned herself and staff assisted her out of the bathroom and over to the bed and sat down.  Pt's BP checked, 124/90 and HR 100. Pt has no c/o, pt stated her daughter was in the parking deck waiting for her to be ready.   Dr Aldona Amel notified of the above, pt's VSS, no c/o pain, oriented x4, given the okay to discharge pt home. Pt's daughter, Maurie Southern called, informed her of the above as well, spoke of her discharge instructions about her mobility restrictions and to see the instructions/pictures in her instructions.    At 1255---  Pt taken out via wheelchair with her belongings, escorted by unit NT and SWOT nurse, staff will take pt by the [facility] to pick up her scripts.

## 2024-01-04 NOTE — ED Notes (Addendum)
 Biotronik PM interrogated by ED CN. PM incision covered with steri strips, intact. Scant dry blood at edge, otherwise unremarkable. No s/sx of infection.

## 2024-01-04 NOTE — ED Triage Notes (Signed)
 Pt bibems. Pt was admitted to hospital for afib. Cardiac arrest earlier this week, pacemaker placed a couple days ago. Discharged today. Syncopal episode today, daughter caught her. Unconscious for about 2 mins. Afib rvr with ems.  CBG 157 BP 138/70 HR 110-120afib O2 91% RA

## 2024-01-04 NOTE — ED Notes (Signed)
 Message sent to provider asking for a diet order

## 2024-01-04 NOTE — H&P (Signed)
 History and Physical    Barbara Soto QMV:[id number] DOB: May 15, 1941 DOA: 01/04/2024  PCP: Windell Hasty, DO  Patient coming from: Home  I have personally briefly reviewed patient's old medical records in [facility]  Chief Complaint: Passing out  HPI: Barbara Soto is a 83 y.o. female with medical history significant of hypertension, depression and recent hospitalization from 12/29/2023 through 01/04/2024 for syncope and was found to have new onset of atrial fibrillation with RVR however during which hospitalization she had significant cardiac pauses and cardiac arrest leading to CPR followed by evaluation by EP and PPM implantation by EP on 01/01/2024.  Despite of having PPM, patient was still having episodes of syncope while in the hospital however for last 2 to 3 days, she was asymptomatic so she was eventually discharged home today with instructions to continue lidocaine patch and oxycodone but to resume Eliquis on 01/07/2024.  However, when she arrived home, home, she had another episode of passing out.  Her daughter was able to catch her and prevented the fall.  Reportedly patient was unconscious for approximately 2 minutes.  Eyes rolled back and she did urinate on herself during the episode but no shaking or tongue biting.  However patient denies having urinated on herself.  She is fully alert and oriented.  She has no complaints.  She denies any prodromal symptoms.  When EMS arrived, she was not atrial fibrillation with RVR.  During the hospitalization, she had normal echo as well as EEG.   ED Course: Upon arrival to ED, she was hemodynamically stable other than mild tachypnea.  CBC shows chronic anemia which is at baseline.  BMP does not show any electrolyte abnormalities either.  Patient received 1 L of fluid bolus and potassium chloride.  Cardiology has been consulted and then hospitalist were consulted to admit the patient for further management.  Review of Systems: As per HPI otherwise  negative.    No past medical history on file.  Past Surgical History:  Procedure Laterality Date   PACEMAKER IMPLANT N/A 01/01/2024   Procedure: PACEMAKER IMPLANT;  Surgeon: Boyce Byes, MD;  Location: [facility] INVASIVE CV LAB;  Service: Cardiovascular;  Laterality: N/A;     reports that she has never smoked. She has never used smokeless tobacco. She reports that she does not drink alcohol and does not use drugs.  Allergies  Allergen Reactions   Latex Rash   Penicillins Other (See Comments)    -Pt stated that this medication "does her no good"    No family history on file.  Prior to Admission medications   Medication Sig Start Date End Date Taking? Authorizing Provider  apixaban (ELIQUIS) 5 MG TABS tablet Take 1 tablet (5 mg total) by mouth 2 (two) times daily. 01/07/24  Yes Lesa Rape, MD  metoprolol  succinate (TOPROL -XL) 25 MG 24 hr tablet Take 1 tablet (25 mg total) by mouth 2 (two) times daily. 01/02/24 02/01/24 Yes Lesa Rape, MD  oxyCODONE (OXY IR/ROXICODONE) 5 MG immediate release tablet Take 1 tablet (5 mg total) by mouth every 6 (six) hours as needed for severe pain (pain score 7-10). 01/04/24  Yes Gherghe, Costin M, MD  sertraline (ZOLOFT) 25 MG tablet Take 25 mg by mouth daily. 12/23/23  Yes [provider]  lidocaine (LIDODERM) 5 % Place 1 patch onto the skin daily. Remove & Discard patch within 12 hours or as directed by MD 01/04/24   Osborn Blaze, MD    Physical Exam: Vitals:  01/04/24 1620 01/04/24 1625 01/04/24 1630 01/04/24 1635  BP:    133/80  Pulse: 90 99 92 93  Resp: (!) 22 19 20 20   Temp:      TempSrc:      SpO2: 98% 98% 99% 99%  Weight:      Height:        Constitutional: NAD, calm, comfortable Vitals:   01/04/24 1620 01/04/24 1625 01/04/24 1630 01/04/24 1635  BP:    133/80  Pulse: 90 99 92 93  Resp: (!) 22 19 20 20   Temp:      TempSrc:      SpO2: 98% 98% 99% 99%  Weight:      Height:       Eyes: PERRL, lids and conjunctivae  normal ENMT: Mucous membranes are moist. Posterior pharynx clear of any exudate or lesions.Normal dentition.  Neck: normal, supple, no masses, no thyromegaly Respiratory: clear to auscultation bilaterally, no wheezing, no crackles. Normal respiratory effort. No accessory muscle use.  Cardiovascular: Irregularly irregular rate and rhythm, no murmurs / rubs / gallops. No extremity edema. 2+ pedal pulses. No carotid bruits.  Abdomen: no tenderness, no masses palpated. No hepatosplenomegaly. Bowel sounds positive.  Musculoskeletal: no clubbing / cyanosis. No joint deformity upper and lower extremities. Good ROM, no contractures. Normal muscle tone.  Skin: no rashes, lesions, ulcers. No induration Neurologic: CN 2-12 grossly intact. Sensation intact, DTR normal. Strength 5/5 in all 4.  Psychiatric: Normal judgment and insight. Alert and oriented x 3. Normal mood.    Labs on Admission: I have personally reviewed following labs and imaging studies  CBC: Recent Labs  Lab 01/01/24 0518 01/02/24 0547 01/03/24 0400 01/04/24 0421 01/04/24 1515  WBC 6.1 6.2 7.4 8.2 8.1  HGB 9.7* 9.9* 9.9* 10.0* 10.4*  HCT 30.7* 29.9* 30.3* 31.2* 33.5*  MCV 93.0 91.4 92.1 92.6 93.1  PLT 140* 141* 155 190 188   Basic Metabolic Panel: Recent Labs  Lab 12/30/23 1952 12/31/23 0447 01/01/24 0518 01/02/24 0547 01/03/24 0400 01/04/24 0421 01/04/24 1515  NA 139   < > 136 138 137 138 137  K 4.2   < > 3.4* 3.7 3.5 3.6 3.6  CL 103   < > 102 103 101 103 101  CO2 27   < > 27 29 26 26 24   GLUCOSE 135*   < > 94 95 106* 120* 146*  BUN 13   < > 10 14 17 20  24*  CREATININE 1.21*   < > 0.93 0.86 0.80 1.01* 0.97  CALCIUM 8.5*   < > 8.1* 8.5* 8.6* 8.7* 8.6*  MG 1.9  --   --   --   --  1.8 1.9  PHOS  --   --   --   --   --  2.9  --    < > = values in this interval not displayed.   GFR: Estimated Creatinine Clearance: 48 mL/min (by C-G formula based on SCr of 0.97 mg/dL). Liver Function Tests: Recent Labs  Lab  12/29/23 1337 12/30/23 1952 01/04/24 0421  AST 30 30 24   ALT 14 15 13   ALKPHOS 85 75 66  BILITOT 0.9 1.4* 1.7*  PROT 6.6 6.5 6.5  ALBUMIN 3.7 3.4* 3.2*   No results for input(s): "LIPASE", "AMYLASE" in the last 168 hours. No results for input(s): "AMMONIA" in the last 168 hours. Coagulation Profile: Recent Labs  Lab 12/29/23 1337  INR 1.2   Cardiac Enzymes: No results for input(s): "CKTOTAL", "CKMB", "CKMBINDEX", "  TROPONINI" in the last 168 hours. BNP (last 3 results) No results for input(s): "PROBNP" in the last 8760 hours. HbA1C: No results for input(s): "HGBA1C" in the last 72 hours. CBG: Recent Labs  Lab 12/30/23 1847  GLUCAP 129*   Lipid Profile: No results for input(s): "CHOL", "HDL", "LDLCALC", "TRIG", "CHOLHDL", "LDLDIRECT" in the last 72 hours. Thyroid Function Tests: No results for input(s): "TSH", "T4TOTAL", "FREET4", "T3FREE", "THYROIDAB" in the last 72 hours. Anemia Panel: No results for input(s): "VITAMINB12", "FOLATE", "FERRITIN", "TIBC", "IRON", "RETICCTPCT" in the last 72 hours. Urine analysis:    Component Value Date/Time   COLORURINE YELLOW 12/29/2023 1337   APPEARANCEUR CLEAR 12/29/2023 1337   LABSPEC 1.027 12/29/2023 1337   PHURINE 6.0 12/29/2023 1337   GLUCOSEU NEGATIVE 12/29/2023 1337   HGBUR NEGATIVE 12/29/2023 1337   BILIRUBINUR NEGATIVE 12/29/2023 1337   KETONESUR NEGATIVE 12/29/2023 1337   PROTEINUR NEGATIVE 12/29/2023 1337   NITRITE NEGATIVE 12/29/2023 1337   LEUKOCYTESUR MODERATE (A) 12/29/2023 1337    Radiological Exams on Admission: DG Chest Portable 1 View Result Date: 01/04/2024 CLINICAL DATA:  Syncope. EXAM: PORTABLE CHEST 1 VIEW COMPARISON:  January 02, 2024 FINDINGS: There is stable dual lead AICD positioning. The cardiac silhouette is mildly enlarged and unchanged in size. Low lung volumes are noted. Mild to moderate severity atelectasis and/or infiltrate is seen within the left lung base. No pleural effusion or pneumothorax is  identified. The visualized skeletal structures are unremarkable. IMPRESSION: Low lung volumes with mild to moderate severity left basilar atelectasis and/or infiltrate. Electronically Signed   By: Virgle Grime M.D.   On: 01/04/2024 16:13   CT HEAD WO CONTRAST ( ) Result Date: 01/03/2024 CLINICAL DATA:  Syncope EXAM: CT HEAD WITHOUT CONTRAST TECHNIQUE: Contiguous axial images were obtained from the base of the skull through the vertex without intravenous contrast. RADIATION DOSE REDUCTION: This exam was performed according to the departmental dose-optimization program which includes automated exposure control, adjustment of the mA and/or kV according to patient size and/or use of iterative reconstruction technique. COMPARISON:  12/29/2023 FINDINGS: Brain: There is no mass, hemorrhage or extra-axial collection. The size and configuration of the ventricles and extra-axial CSF spaces are normal. There is hypoattenuation of the white matter, most commonly indicating chronic small vessel disease. Vascular: Atherosclerotic calcification of the vertebral and internal carotid arteries at the skull base. No abnormal hyperdensity of the major intracranial arteries or dural venous sinuses. Skull: Negative Sinuses/Orbits: Right frontal, maxillary and sphenoid sinus mucosal thickening Other: None. IMPRESSION: 1. No acute intracranial abnormality. 2. Chronic small vessel disease. 3. Right frontal, maxillary and sphenoid sinus mucosal thickening. Electronically Signed   By: Juanetta Nordmann M.D.   On: 01/03/2024 16:16   EEG adult Result Date: 01/03/2024 Arleene Lack, MD     01/03/2024 11:01 AM Patient Name: RAMONIA MCCLARAN MRN: [id number] Epilepsy Attending: Arleene Lack Referring Physician/Provider: Osborn Blaze, MD Date: 01/03/2024 Duration: 24.09 mins Patient history: 83yo F s/p cardiac arrest. EEG to evaluate for seizure Level of alertness: Awake AEDs during EEG study: None Technical aspects: This EEG study was  done with scalp electrodes positioned according to the 10-20 International system of electrode placement. Electrical activity was reviewed with band pass filter of 1-70Hz , sensitivity of 7 uV/mm, display speed of 52mm/sec with a 60Hz  notched filter applied as appropriate. EEG data were recorded continuously and digitally stored.  Video monitoring was available and reviewed as appropriate. Description: The posterior dominant rhythm consists of 8 Hz activity of moderate voltage (  25-35 uV) seen predominantly in posterior head regions, symmetric and reactive to eye opening and eye closing. Hyperventilation and photic stimulation were not performed.   IMPRESSION: This study is within normal limits. No seizures or epileptiform discharges were seen throughout the recording. A normal interictal EEG does not exclude the diagnosis of epilepsy. Priyanka Suzanne Erps    EKG: Independently reviewed.  Atrial fibrillation, rates 98, left anterior fascicular block  Assessment/Plan Principal Problem:   Syncope   Recurrent/malignant syncope/atrial fibrillation: As mentioned above, patient was recently hospitalized and was in fact discharged earlier today.  Recent hospitalization was also secondary to syncope as well as new onset of atrial fibrillation is status post PPM.  She is yet being admitted again due to another episode of syncope that happened right when she arrived home.  She declined having urinated on herself so doubt seizure.  Also she had negative EEG done during recent hospitalization.  Cardiology has been consulted.  She will be monitored on telemetry.  Per cardiology recommendations, she is supposed to resume her anticoagulant on 01/07/2024.  Spoke to previous hospitalist, unfortunately MRI brain could not be obtained either since that can only be done 6 weeks post PPM event with MRI compatible pacemaker's.  I will resume all her home medications including beta-blockers.  Will check orthostatic vitals every  shift.  CKD stage IIIa: At baseline.  Chronic normocytic anemia: Hemoglobin at baseline.  FOBT was negative during recent hospitalization.  Recent left ankle injury: Follows with orthopedics, was supposed to have a cast placed after skin cancer removed from her left leg.  Weightbearing as tolerated.  Morbid obesity class II: BMI 39.6.  Weight loss and diet modification counseled.  DVT prophylaxis: enoxaparin (LOVENOX) injection 40 mg Start: 01/04/24 1715 Code Status: Full code Family Communication: None present at bedside.  Plan of care discussed with patient in length and he verbalized understanding and agreed with it. Disposition Plan: Will be discharged when cleared by cardiology Consults called: Cardiology  Modena Andes MD [facility]  *Please note that this is a verbal dictation therefore any spelling or grammatical errors are due to the "[employer]" system interpretation.  Please page via Amion and do not message via secure chat for urgent patient care matters. Secure chat can be used for non urgent patient care matters. 01/04/2024, 5:04 PM  To contact the attending provider between 7A-7P or the covering provider during after hours 7P-7A, please log into the web site [email or web address]

## 2024-01-04 NOTE — Discharge Summary (Signed)
 Physician Discharge Summary  Barbara Soto KGM:[id number] DOB: [birth year] DOA: 12/29/2023  PCP: Windell Hasty, DO  Admit date: 12/29/2023 Discharge date: 01/04/2024  Admitted From: home Disposition:  home  Recommendations for Outpatient Follow-up:  Follow up with PCP in 1-2 weeks Follow-up with cardiologyas scheduled  Home Health: PT Equipment/Devices: none  Discharge Condition: stable CODE STATUS: Full code Diet Orders (From admission, onward)     Start     Ordered   01/01/24 1942  Diet Heart Room service appropriate? Yes; Fluid consistency: Thin  Diet effective now       Question Answer Comment  Room service appropriate? Yes   Fluid consistency: Thin      01/01/24 1941           Brief Narrative / Interim history: 83 year old female with HTN, depression was brought into the hospital after syncopal episode.  Imaging in the ER was without acute findings, including a CT scan of the head and neck as well as chest abdomen pelvis.  She was found to be in A-fib, new onset, with RVR.  Hospital course complicated by cardiac arrest while in house status post CPR.  Cardiology consulted, EP evaluated patient and underwent pacemaker placement on 6/2 due to symptomatic bradycardia/cardiac arrest  Hospital Course / Discharge diagnoses: Principal problem Cardiac arrest with pauses, while hospitalized -possibly malignant vasovagal syncope with long pauses.  Heart rate was less than 20s needing chest compressions and code activation 5/31.  She had an episode of syncope at home prior to admission.  Cardiology consulted, underwent PPM implantation by EP on 6/2.  She has not had any further cardiac issues while hospitalized.  Continue pain management with lidocaine patch as well as oxycodone for her chest discomfort following the CPR.  Pain is controlled, she is much more comfortable   Active problems Recurrent presyncopal episodes-happened several times even after pacemaker implantation,?   Persistent vasovagal episode with fluctuation in blood pressure.  Underwent a CT scan of the brain, EEG, all unremarkable.  With better pain control this seems to have resolved and has had no further episodes. New onset A-fib-2D echocardiogram shows LVEF 60-65%, TSH was unremarkable.  She now has a pacemaker and is on metoprolol .  She is to start anticoagulation with Eliquis upon discharge, start date is 6/8  Acute kidney injury on CKD 3A -creatinine returned to baseline Hypokalemia-replaced, continue monitoring as an outpatient Normocytic anemia-monitor, no bleeding, fecal occult is negative.  Slight hemoglobin drop was likely dilutional.  Hemoglobin now improving Recent left ankle injury -follows with orthopedics, supposed to have a cast placed after skin cancer removed from her left leg.  Weight-bear as tolerated Obesity, morbid-BMI 41.  She would benefit from weight loss  Sepsis ruled out   Discharge Instructions  Discharge Instructions     (HEART FAILURE PATIENTS) Call MD:  Anytime you have any of the following symptoms: 1) 3 pound weight gain in 24 hours or 5 pounds in 1 week 2) shortness of breath, with or without a dry hacking cough 3) swelling in the hands, feet or stomach 4) if you have to sleep on extra pillows at night in order to breathe.   Complete by: As directed    Discharge instructions   Complete by: As directed    Please call call MD or return to ER for similar or worsening recurring problem that brought you to hospital or if any fever,nausea/vomiting,abdominal pain, uncontrolled pain, chest pain,  shortness of breath or any other alarming symptoms.  Please follow-up your doctor as instructed in a week time and call the office for appointment.  Please avoid alcohol, smoking, or any other illicit substance and maintain healthy habits including taking your regular medications as prescribed.  You were cared for by a hospitalist during your hospital stay. If you have any  questions about your discharge medications or the care you received while you were in the hospital after you are discharged, you can call the unit and ask to speak with the hospitalist on call if the hospitalist that took care of you is not available.  Once you are discharged, your primary care physician will handle any further medical issues. Please note that NO REFILLS for any discharge medications will be authorized once you are discharged, as it is imperative that you return to your primary care physician (or establish a relationship with a primary care physician if you do not have one) for your aftercare needs so that they can reassess your need for medications and monitor your lab values   Increase activity slowly   Complete by: As directed       Allergies as of 01/04/2024       Reactions   Latex Rash   Penicillins Other (See Comments)   -Pt stated that this medication "does her no good"        Medication List     STOP taking these medications    amLODipine 5 MG tablet Commonly known as: NORVASC   aspirin EC 81 MG tablet   lisinopril 10 MG tablet Commonly known as: ZESTRIL       TAKE these medications    Eliquis 5 MG Tabs tablet Generic drug: apixaban Take 1 tablet (5 mg total) by mouth 2 (two) times daily. Start taking on: January 07, 2024   furosemide 20 MG tablet Commonly known as: LASIX Take 20 mg by mouth.   lidocaine 5 % Commonly known as: LIDODERM Place 1 patch onto the skin daily. Remove & Discard patch within 12 hours or as directed by MD   metoprolol  succinate 25 MG 24 hr tablet Commonly known as: TOPROL -XL Take 1 tablet (25 mg total) by mouth 2 (two) times daily.   oxyCODONE 5 MG immediate release tablet Commonly known as: Oxy IR/ROXICODONE Take 1 tablet (5 mg total) by mouth every 6 (six) hours as needed for severe pain (pain score 7-10).   sertraline 25 MG tablet Commonly known as: ZOLOFT Take 25 mg by mouth daily.        Follow-up  Information     [facility] Follow up.   Why: Registered Nurse, Physical and Occupational Therapy-office to call with visit times. Contact information: [address] [place] [address] [phone]                 Consultations: Cardiology   Procedures/Studies:  CT HEAD WO CONTRAST ( ) Result Date: 01/03/2024 CLINICAL DATA:  Syncope EXAM: CT HEAD WITHOUT CONTRAST TECHNIQUE: Contiguous axial images were obtained from the base of the skull through the vertex without intravenous contrast. RADIATION DOSE REDUCTION: This exam was performed according to the departmental dose-optimization program which includes automated exposure control, adjustment of the mA and/or kV according to patient size and/or use of iterative reconstruction technique. COMPARISON:  12/29/2023 FINDINGS: Brain: There is no mass, hemorrhage or extra-axial collection. The size and configuration of the ventricles and extra-axial CSF spaces are normal. There is hypoattenuation of the white matter, most commonly indicating chronic small  vessel disease. Vascular: Atherosclerotic calcification of the vertebral and internal carotid arteries at the skull base. No abnormal hyperdensity of the major intracranial arteries or dural venous sinuses. Skull: Negative Sinuses/Orbits: Right frontal, maxillary and sphenoid sinus mucosal thickening Other: None. IMPRESSION: 1. No acute intracranial abnormality. 2. Chronic small vessel disease. 3. Right frontal, maxillary and sphenoid sinus mucosal thickening. Electronically Signed   By: Juanetta Nordmann M.D.   On: 01/03/2024 16:16   EEG adult Result Date: 01/03/2024 Arleene Lack, MD     01/03/2024 11:01 AM Patient Name: Barbara Soto MRN: [id number] Epilepsy Attending: Arleene Lack Referring Physician/Provider: Osborn Blaze, MD Date: 01/03/2024 Duration: 24.09 mins Patient history: 83yo F s/p cardiac arrest. EEG to evaluate for seizure Level of alertness:  Awake AEDs during EEG study: None Technical aspects: This EEG study was done with scalp electrodes positioned according to the 10-20 International system of electrode placement. Electrical activity was reviewed with band pass filter of 1-70Hz , sensitivity of 7 uV/mm, display speed of 83mm/sec with a 60Hz  notched filter applied as appropriate. EEG data were recorded continuously and digitally stored.  Video monitoring was available and reviewed as appropriate. Description: The posterior dominant rhythm consists of 8 Hz activity of moderate voltage (25-35 uV) seen predominantly in posterior head regions, symmetric and reactive to eye opening and eye closing. Hyperventilation and photic stimulation were not performed.   IMPRESSION: This study is within normal limits. No seizures or epileptiform discharges were seen throughout the recording. A normal interictal EEG does not exclude the diagnosis of epilepsy. Arleene Lack   DG Chest 2 View Result Date: 01/02/2024 CLINICAL DATA:  Status post pacemaker insertion EXAM: CHEST - 2 VIEW COMPARISON:  Chest radiograph dated 12/30/2023 FINDINGS: Lines/tubes: Left chest wall pacemaker leads project over the right atrium and ventricle. Lungs: Low lung volumes with bronchovascular crowding. Dense left basilar opacity. Pleura: Small bilateral pleural effusions.  No pneumothorax. Heart/mediastinum: Similar enlarged cardiomediastinal silhouette. Bones: No acute osseous abnormality. IMPRESSION: 1. Left chest wall pacemaker leads project over the right atrium and ventricle. No pneumothorax. 2. Small bilateral pleural effusions. 3. Dense left basilar opacity, likely atelectasis. Electronically Signed   By: Limin  Xu M.D.   On: 01/02/2024 08:35   EP PPM/ICD IMPLANT Result Date: 01/01/2024  CONCLUSIONS:  1.  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  Successful dual-chamber permanent pacemaker with left bundle area lead  3.  No early apparent  complications.  4.  Hold anticoagulation for 5 days (okay to restart January 07, 2024)   DG Chest [place] 1 View Result Date: 12/30/2023 CLINICAL DATA:  Acute respiratory distress. EXAM: PORTABLE CHEST 1 VIEW COMPARISON:  12/29/2023. FINDINGS: The heart is enlarged and the mediastinal contour stable. There is chronic elevation of right diaphragm. Lung volumes are low. No consolidation, effusion, or pneumothorax is seen. No acute osseous abnormality. IMPRESSION: Stable chest with no active disease. Electronically Signed   By: Wyvonnia Heimlich M.D.   On: 12/30/2023 18:51   ECHOCARDIOGRAM COMPLETE Result Date: 12/30/2023    ECHOCARDIOGRAM REPORT   Patient Name:   LINDSIE SIMAR Date of Exam: 12/30/2023 Medical Rec #:  [id number]      Height:       61.0 in Accession #:    [id number]     Weight:       219.2 lb Date of Birth:  Dec 31, 1940     BSA:          1.964 m Patient Age:  82 years       BP:           108/65 mmHg Patient Gender: F              HR:           78 bpm. Exam Location:  Inpatient Procedure: 2D Echo (Both Spectral and Color Flow Doppler were utilized during            procedure). Indications:    Atrial fibrillation  History:        Patient has no prior history of Echocardiogram examinations.                 Arrythmias:Atrial Fibrillation.  Sonographer:    Dione Franks RDCS Referring Phys: [id number] RAMESH KC  Sonographer Comments: Image acquisition challenging due to respiratory motion and Image acquisition challenging due to patient body habitus. IMPRESSIONS  1. Left ventricular ejection fraction, by estimation, is 60 to 65%. The left ventricle has normal function. The left ventricle has no regional wall motion abnormalities. There is mild left ventricular hypertrophy. Left ventricular diastolic parameters are indeterminate.  2. Right ventricular systolic function was not well visualized. The right ventricular size is not well visualized. There is moderately elevated pulmonary artery systolic pressure.  3.  Left atrial size was moderately dilated.  4. The mitral valve is normal in structure. Trivial mitral valve regurgitation. No evidence of mitral stenosis.  5. The tricuspid valve is abnormal. Tricuspid valve regurgitation is moderate.  6. The aortic valve is tricuspid. There is mild calcification of the aortic valve. There is mild thickening of the aortic valve. Aortic valve regurgitation is not visualized. No aortic stenosis is present.  7. The inferior vena cava is dilated in size with >50% respiratory variability, suggesting right atrial pressure of 8 mmHg. FINDINGS  Left Ventricle: Left ventricular ejection fraction, by estimation, is 60 to 65%. The left ventricle has normal function. The left ventricle has no regional wall motion abnormalities. Definity contrast agent was given IV to delineate the left ventricular  endocardial borders. The left ventricular internal cavity size was normal in size. There is mild left ventricular hypertrophy. Left ventricular diastolic parameters are indeterminate. Right Ventricle: RV not well visualized. Grossly appears normal in size and function. The right ventricular size is not well visualized. Right vetricular wall thickness was not well visualized. Right ventricular systolic function was not well visualized.  There is moderately elevated pulmonary artery systolic pressure. The tricuspid regurgitant velocity is 3.56 m/s, and with an assumed right atrial pressure of 8 mmHg, the estimated right ventricular systolic pressure is 58.7 mmHg. Left Atrium: Left atrial size was moderately dilated. Right Atrium: Right atrial size was normal in size. Pericardium: There is no evidence of pericardial effusion. Mitral Valve: The mitral valve is normal in structure. There is mild thickening of the mitral valve leaflet(s). There is mild calcification of the mitral valve leaflet(s). Mild mitral annular calcification. Trivial mitral valve regurgitation. No evidence  of mitral valve stenosis.  Tricuspid Valve: The tricuspid valve is abnormal. Tricuspid valve regurgitation is moderate . No evidence of tricuspid stenosis. Aortic Valve: The aortic valve is tricuspid. There is mild calcification of the aortic valve. There is mild thickening of the aortic valve. There is mild aortic valve annular calcification. Aortic valve regurgitation is not visualized. No aortic stenosis  is present. Aortic valve mean gradient measures 2.9 mmHg. Aortic valve peak gradient measures 7.3 mmHg. Aortic valve area, by VTI measures 2.09 cm. Pulmonic Valve:  The pulmonic valve was not well visualized. Pulmonic valve regurgitation is not visualized. No evidence of pulmonic stenosis. Aorta: The aortic root and ascending aorta are structurally normal, with no evidence of dilitation. Venous: The inferior vena cava is dilated in size with greater than 50% respiratory variability, suggesting right atrial pressure of 8 mmHg. IAS/Shunts: The interatrial septum was not well visualized.  LEFT VENTRICLE PLAX 2D LVIDd:         3.90 cm LVIDs:         2.90 cm LV PW:         1.00 cm LV IVS:        1.20 cm LVOT diam:     1.90 cm LV SV:         48 LV SV Index:   25 LVOT Area:     2.84 cm  RIGHT VENTRICLE          IVC RV Basal diam:  2.30 cm  IVC diam: 2.60 cm TAPSE (M-mode): 1.3 cm LEFT ATRIUM             Index        RIGHT ATRIUM           Index LA diam:        4.60 cm 2.34 cm/m   RA Area:     14.20 cm LA Vol (A2C):   93.5 ml 47.61 ml/m  RA Volume:   31.90 ml  16.24 ml/m LA Vol (A4C):   83.3 ml 42.42 ml/m LA Biplane Vol: 88.8 ml 45.22 ml/m  AORTIC VALVE AV Area (Vmax):    1.79 cm AV Area (Vmean):   2.06 cm AV Area (VTI):     2.09 cm AV Vmax:           135.26 cm/s AV Vmean:          78.313 cm/s AV VTI:            0.232 m AV Peak Grad:      7.3 mmHg AV Mean Grad:      2.9 mmHg LVOT Vmax:         85.20 cm/s LVOT Vmean:        56.800 cm/s LVOT VTI:          0.171 m LVOT/AV VTI ratio: 0.74  AORTA Ao Root diam: 3.10 cm Ao Asc diam:  3.20 cm  TRICUSPID VALVE TR Peak grad:   50.7 mmHg TR Vmax:        356.00 cm/s  SHUNTS Systemic VTI:  0.17 m Systemic Diam: 1.90 cm Armida Lander MD Electronically signed by Armida Lander MD Signature Date/Time: 12/30/2023/3:32:16 PM    Final    DG Ankle 2 Views Left Result Date: 12/30/2023 CLINICAL DATA:  Left ankle, two views EXAM: LEFT ANKLE - 2 VIEW COMPARISON:  None Available. FINDINGS: No evidence of acute fracture or malalignment. Tibiotalar joint is symmetric. Artifact overlies the distal soft tissues. Moderate to large calcaneal spurring. IMPRESSION: 1. No evidence of acute fracture. 2. Calcaneal spurring. Electronically Signed   By: Reagan Camera M.D.   On: 12/30/2023 14:14   CT LUMBAR SPINE WO CONTRAST Result Date: 12/30/2023 EXAM: CT OF THE LUMBAR SPINE WITHOUT CONTRAST 12/30/2023 11:42:11 AM TECHNIQUE: CT of the lumbar spine was performed without the administration of intravenous contrast. Multiplanar reformatted images are provided for review. Automated exposure control, iterative reconstruction, and/or weight based adjustment of the mA/kV was utilized to reduce the radiation dose to as low as reasonably achievable.  COMPARISON: Lumbar spine radiograph 01/08/2020 and lumbar spine MRI 07/20/2013. CLINICAL HISTORY: Low back pain, trauma. Back pain. FINDINGS: BONES AND ALIGNMENT: Conventional lumbosacral anatomy is assumed with 5 non-rib-bearing, lumbar-type vertebral bodies. Congenitally short pedicles throughout the lumbar spine. No acute fracture or suspicious bone lesion. Normal alignment. Ankylosis of the bilateral sacroiliac joints. DEGENERATIVE CHANGES: At L2-3, left eccentric disc bulge and left greater than right facet arthropathy result in at least mild central spinal canal stenosis with severe narrowing of the left subarticular zone and moderate bilateral neural foraminal narrowing. At L3-4, disc extrusion and facet arthropathy result in mild spinal canal stenosis and moderate left neural  foraminal narrowing. At L4-5, disc bulge and moderate bilateral facet arthropathy result in moderate spinal canal stenosis with severe left and moderate right neural foraminal narrowing. At L5-S1, disc bulge and facet arthropathy result in severe bilateral neural foraminal narrowing. SOFT TISSUES: No acute abnormality. Atherosclerotic calcifications of the abdominal aorta and its branches. IMPRESSION: 1. No acute traumatic injury to the lumbar spine . 2. Multilevel lumbar spondylosis, worse than L4-5, where there is moderate spinal canal stenosis, severe left and moderate right neural foraminal narrowing. Electronically signed by: Audra Blend MD 12/30/2023 11:54 AM EDT RP Workstation: [id number]   CT CHEST ABDOMEN PELVIS W CONTRAST Result Date: 12/29/2023 CLINICAL DATA:  Blunt trauma, status post fall. Wound bottom of steps. EXAM: CT CHEST, ABDOMEN, AND PELVIS WITH CONTRAST TECHNIQUE: Multidetector CT imaging of the chest, abdomen and pelvis was performed following the standard protocol during bolus administration of intravenous contrast. RADIATION DOSE REDUCTION: This exam was performed according to the departmental dose-optimization program which includes automated exposure control, adjustment of the mA and/or kV according to patient size and/or use of iterative reconstruction technique. CONTRAST:  60mL OMNIPAQUE  IOHEXOL  350 MG/ML SOLN COMPARISON:  None Available. FINDINGS: CT CHEST FINDINGS Cardiovascular: No evidence of acute aortic or traumatic vascular injury. The heart is normal in size. There is a small pericardial effusion. Coronary artery calcifications. Mild aortic atherosclerosis. Mediastinum/Nodes: No mediastinal hemorrhage or hematoma. No mediastinal or hilar adenopathy. No pneumomediastinum. Decompressed esophagus. Lungs/Pleura: No pneumothorax. No pulmonary contusion. Small right pleural effusion appears to be simple fluid density. Hypoventilatory changes in the dependent lungs. Mild subpleural  reticulation. Trachea and central airways are clear. Musculoskeletal: No acute fracture of the ribs, sternum, included clavicles or shoulder girdles. Degenerative change throughout the thoracic spine and both shoulders. No confluent chest wall contusion. CT ABDOMEN PELVIS FINDINGS Hepatobiliary: No hepatic injury or perihepatic hematoma. No focal liver abnormality. Cholecystectomy without biliary dilatation. Pancreas: No evidence of injury. No ductal dilatation or inflammation. Spleen: No splenic injury or perisplenic hematoma. Adrenals/Urinary Tract: No adrenal nodule or hemorrhage. No evidence of renal injury. No hydronephrosis. Homogeneous renal enhancement. Urinary bladder is decompressed. No evidence of bladder injury. Stomach/Bowel: No evidence of bowel injury or mesenteric hematoma. Stomach is decompressed. Small duodenal diverticulum. No bowel wall thickening or inflammation. Small volume of stool throughout the colon. Colonic diverticulosis without diverticulitis. The appendix is not confidently seen. Vascular/Lymphatic: Aortic atherosclerosis. No aortic aneurysm. No vascular injury or retroperitoneal fluid. No bulky adenopathy. Reproductive: Status post hysterectomy. No adnexal masses. Other: No free air or free fluid.  No confluent body wall contusion. Musculoskeletal: Right hip arthroplasty. No periprosthetic lucency. No acute fracture of the pelvis or lumbar spine. Degenerative change of the lumbar spine and left hip. IMPRESSION: 1. No evidence of acute traumatic injury to the chest, abdomen, or pelvis. 2. Small right pleural effusion. Small pericardial effusion. 3. Coronary  artery calcifications. 4. Colonic diverticulosis without diverticulitis. Aortic Atherosclerosis (ICD10-I70.0). Electronically Signed   By: Chadwick Colonel M.D.   On: 12/29/2023 16:36   DG Pelvis Portable Result Date: 12/29/2023 CLINICAL DATA:  Trauma, fall. EXAM: PORTABLE PELVIS 1-2 VIEWS COMPARISON:  None Available. FINDINGS:  Technically limited by soft tissue attenuation from habitus. No evidence of pelvic fracture. Right hip arthroplasty is only partially included in the field of view, intact were visualized. Left hip osteoarthritis without hip dislocation. Pubic rami are grossly intact. No pubic symphyseal or sacroiliac diastasis. IMPRESSION: 1. No pelvic fracture. 2. Right hip arthroplasty. 3. Left hip osteoarthritis. Electronically Signed   By: Chadwick Colonel M.D.   On: 12/29/2023 16:29   DG Chest Port 1 View Result Date: 12/29/2023 CLINICAL DATA:  Trauma, fall. EXAM: PORTABLE CHEST 1 VIEW COMPARISON:  Radiograph 08/24/2020 FINDINGS: Low lung volumes limit assessment. Prominent heart size likely accentuated by technique. Artifact projects over the right lung base. No evidence of pneumothorax, pleural effusion or focal airspace disease. No grossly displaced fracture on limited osseous assessment. IMPRESSION: Hypoventilatory chest without evidence of acute traumatic injury. Electronically Signed   By: Chadwick Colonel M.D.   On: 12/29/2023 16:29   CT HEAD WO CONTRAST Result Date: 12/29/2023 CLINICAL DATA:  Head trauma, moderate-severe; Polytrauma, blunt. Level 2 trauma. Fall. Found at the bottom of steps unconscious. EXAM: CT HEAD WITHOUT CONTRAST CT CERVICAL SPINE WITHOUT CONTRAST TECHNIQUE: Multidetector CT imaging of the head and cervical spine was performed following the standard protocol without intravenous contrast. Multiplanar CT image reconstructions of the cervical spine were also generated. RADIATION DOSE REDUCTION: This exam was performed according to the departmental dose-optimization program which includes automated exposure control, adjustment of the mA and/or kV according to patient size and/or use of iterative reconstruction technique. COMPARISON:  CT head and cervical spine 06/30/2008 FINDINGS: CT HEAD FINDINGS Brain: There is no evidence of an acute infarct, intracranial hemorrhage, mass, midline shift, or  extra-axial fluid collection. Mild cerebral atrophy is within normal limits for age. Cerebral white matter hypodensities are nonspecific but compatible with mild chronic small vessel ischemic disease. Vascular: Calcified atherosclerosis at the skull base. No hyperdense vessel. Skull: No acute fracture or suspicious lesion. Sinuses/Orbits: Chronic sinusitis including subtotal opacification of the right sphenoid sinus. Small chronic left mastoid effusion. Bilateral cataract extraction. Other: None. CT CERVICAL SPINE FINDINGS Alignment: No significant subluxation. Skull base and vertebrae: No acute fracture or suspicious lesion. Soft tissues and spinal canal: No prevertebral fluid or swelling. No visible canal hematoma. Disc levels: Mild spondylosis. Advanced multilevel cervical facet arthrosis with facet ankylosis on the left at C2-3 and right at C3-4. Severe neural foraminal stenosis on the right at C3-4 and on the left at C4-5. No evidence of high-grade spinal canal stenosis. Upper chest: Evaluated on the separately reported contemporaneous CT of the chest, abdomen, and pelvis. Other: None. IMPRESSION: 1. No evidence of acute intracranial abnormality or cervical spine fracture. 2. Mild chronic small vessel ischemic disease. Electronically Signed   By: Aundra Lee M.D.   On: 12/29/2023 16:13   CT CERVICAL SPINE WO CONTRAST Result Date: 12/29/2023 CLINICAL DATA:  Head trauma, moderate-severe; Polytrauma, blunt. Level 2 trauma. Fall. Found at the bottom of steps unconscious. EXAM: CT HEAD WITHOUT CONTRAST CT CERVICAL SPINE WITHOUT CONTRAST TECHNIQUE: Multidetector CT imaging of the head and cervical spine was performed following the standard protocol without intravenous contrast. Multiplanar CT image reconstructions of the cervical spine were also generated. RADIATION DOSE REDUCTION: This exam was  performed according to the departmental dose-optimization program which includes automated exposure control, adjustment  of the mA and/or kV according to patient size and/or use of iterative reconstruction technique. COMPARISON:  CT head and cervical spine 06/30/2008 FINDINGS: CT HEAD FINDINGS Brain: There is no evidence of an acute infarct, intracranial hemorrhage, mass, midline shift, or extra-axial fluid collection. Mild cerebral atrophy is within normal limits for age. Cerebral white matter hypodensities are nonspecific but compatible with mild chronic small vessel ischemic disease. Vascular: Calcified atherosclerosis at the skull base. No hyperdense vessel. Skull: No acute fracture or suspicious lesion. Sinuses/Orbits: Chronic sinusitis including subtotal opacification of the right sphenoid sinus. Small chronic left mastoid effusion. Bilateral cataract extraction. Other: None. CT CERVICAL SPINE FINDINGS Alignment: No significant subluxation. Skull base and vertebrae: No acute fracture or suspicious lesion. Soft tissues and spinal canal: No prevertebral fluid or swelling. No visible canal hematoma. Disc levels: Mild spondylosis. Advanced multilevel cervical facet arthrosis with facet ankylosis on the left at C2-3 and right at C3-4. Severe neural foraminal stenosis on the right at C3-4 and on the left at C4-5. No evidence of high-grade spinal canal stenosis. Upper chest: Evaluated on the separately reported contemporaneous CT of the chest, abdomen, and pelvis. Other: None. IMPRESSION: 1. No evidence of acute intracranial abnormality or cervical spine fracture. 2. Mild chronic small vessel ischemic disease. Electronically Signed   By: Aundra Lee M.D.   On: 12/29/2023 16:13    Subjective: - no chest pain, shortness of breath, no abdominal pain, nausea or vomiting.   Discharge Exam: BP (P) 134/84 (BP Location: Right Arm)   Pulse (P) 98   Temp (P) 98.3 F (36.8 C) (Oral)   Resp (P) 20   Ht 5\' 1"  (1.549 m)   Wt 99.4 kg   SpO2 97%   BMI 41.42 kg/m   General: Pt is alert, awake, not in acute distress Cardiovascular:  RRR, S1/S2 +, no rubs, no gallops Respiratory: CTA bilaterally, no wheezing, no rhonchi Abdominal: Soft, NT, ND, bowel sounds + Extremities: no edema, no cyanosis    The results of significant diagnostics from this hospitalization (including imaging, microbiology, ancillary and laboratory) are listed below for reference.     Microbiology: Recent Results (from the past 240 hours)  Surgical PCR screen     Status: None   Collection Time: 12/31/23  8:40 AM   Specimen: Nasal Mucosa; Nasal Swab  Result Value Ref Range Status   MRSA, PCR NEGATIVE NEGATIVE Final   Staphylococcus aureus NEGATIVE NEGATIVE Final    Comment: (NOTE) The Xpert SA Assay (FDA approved for NASAL specimens in patients 16 years of age and older), is one component of a comprehensive surveillance program. It is not intended to diagnose infection nor to guide or monitor treatment. Performed at [facility], [address] [place], [place] [address]      Labs: Basic Metabolic Panel: Recent Labs  Lab 12/30/23 1952 12/31/23 0447 01/01/24 0518 01/02/24 0547 01/03/24 0400 01/04/24 0421  NA 139 138 136 138 137 138  K 4.2 3.9 3.4* 3.7 3.5 3.6  CL 103 104 102 103 101 103  CO2 27 26 27 29 26 26   GLUCOSE 135* 121* 94 95 106* 120*  BUN 13 13 10 14 17 20   CREATININE 1.21* 1.10* 0.93 0.86 0.80 1.01*  CALCIUM 8.5* 8.1* 8.1* 8.5* 8.6* 8.7*  MG 1.9  --   --   --   --  1.8  PHOS  --   --   --   --   --  2.9   Liver Function Tests: Recent Labs  Lab 12/29/23 1337 12/30/23 1952 01/04/24 0421  AST 30 30 24   ALT 14 15 13   ALKPHOS 85 75 66  BILITOT 0.9 1.4* 1.7*  PROT 6.6 6.5 6.5  ALBUMIN 3.7 3.4* 3.2*   CBC: Recent Labs  Lab 12/31/23 0447 01/01/24 0518 01/02/24 0547 01/03/24 0400 01/04/24 0421  WBC 6.9 6.1 6.2 7.4 8.2  HGB 9.8* 9.7* 9.9* 9.9* 10.0*  HCT 30.7* 30.7* 29.9* 30.3* 31.2*  MCV 92.7 93.0 91.4 92.1 92.6  PLT 163 140* 141* 155 190   CBG: Recent Labs  Lab 12/30/23 1847  GLUCAP 129*    Hgb A1c No results for input(s): "HGBA1C" in the last 72 hours. Lipid Profile No results for input(s): "CHOL", "HDL", "LDLCALC", "TRIG", "CHOLHDL", "LDLDIRECT" in the last 72 hours. Thyroid function studies No results for input(s): "TSH", "T4TOTAL", "T3FREE", "THYROIDAB" in the last 72 hours.  Invalid input(s): "FREET3" Urinalysis    Component Value Date/Time   COLORURINE YELLOW 12/29/2023 1337   APPEARANCEUR CLEAR 12/29/2023 1337   LABSPEC 1.027 12/29/2023 1337   PHURINE 6.0 12/29/2023 1337   GLUCOSEU NEGATIVE 12/29/2023 1337   HGBUR NEGATIVE 12/29/2023 1337   BILIRUBINUR NEGATIVE 12/29/2023 1337   KETONESUR NEGATIVE 12/29/2023 1337   PROTEINUR NEGATIVE 12/29/2023 1337   NITRITE NEGATIVE 12/29/2023 1337   LEUKOCYTESUR MODERATE (A) 12/29/2023 1337    FURTHER DISCHARGE INSTRUCTIONS:   Get Medicines reviewed and adjusted: Please take all your medications with you for your next visit with your Primary MD   Laboratory/radiological data: Please request your Primary MD to go over all hospital tests and procedure/radiological results at the follow up, please ask your Primary MD to get all Hospital records sent to his/her office.   In some cases, they will be blood work, cultures and biopsy results pending at the time of your discharge. Please request that your primary care M.D. goes through all the records of your hospital data and follows up on these results.   Also Note the following: If you experience worsening of your admission symptoms, develop shortness of breath, life threatening emergency, suicidal or homicidal thoughts you must seek medical attention immediately by calling 911 or calling your MD immediately  if symptoms less severe.   You must read complete instructions/literature along with all the possible adverse reactions/side effects for all the Medicines you take and that have been prescribed to you. Take any new Medicines after you have completely understood and  accpet all the possible adverse reactions/side effects.    Do not drive when taking Pain medications or sleeping medications (Benzodaizepines)   Do not take more than prescribed Pain, Sleep and Anxiety Medications. It is not advisable to combine anxiety,sleep and pain medications without talking with your primary care practitioner   Special Instructions: If you have smoked or chewed Tobacco  in the last 2 yrs please stop smoking, stop any regular Alcohol  and or any Recreational drug use.   Wear Seat belts while driving.   Please note: You were cared for by a hospitalist during your hospital stay. Once you are discharged, your primary care physician will handle any further medical issues. Please note that NO REFILLS for any discharge medications will be authorized once you are discharged, as it is imperative that you return to your primary care physician (or establish a relationship with a primary care physician if you do not have one) for your post hospital discharge needs so that they can reassess your need  for medications and monitor your lab values.  Time coordinating discharge: 40 minutes  SIGNED:  Kathlen Para, MD, PhD 01/04/2024, 11:16 AM

## 2024-01-04 NOTE — ED Provider Notes (Signed)
 [facility] EMERGENCY DEPARTMENT AT [facility] Provider Note   CSN: [id number] Arrival date & time: 01/04/24  1505     History  Chief Complaint  Patient presents with   Loss of Consciousness    Barbara Soto is a 83 y.o. female.  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  Reportedly went home and had a syncopal event today.  Per daughter picked her up from the hospital and got to the house and when she went to get in she passed out shortly afterwards.  Her daughter was able to catch her but she was unconscious for approximately 2 minutes. Eyes rolled back in her head. Did urinate on herself. No shaking. When EMS arrived she was in atrial fibrillation with RVR.  Has not yet started her Eliquis.  Denies any symptoms at this time.  Hospitalization included normal echo as well as EEG.        Home Medications Prior to Admission medications   Medication Sig Start Date End Date Taking? Authorizing Provider  apixaban (ELIQUIS) 5 MG TABS tablet Take 1 tablet (5 mg total) by mouth 2 (two) times daily. 01/07/24  Yes Lesa Rape, MD  metoprolol  succinate (TOPROL -XL) 25 MG 24 hr tablet Take 1 tablet (25 mg total) by mouth 2 (two) times daily. 01/02/24 02/01/24 Yes Lesa Rape, MD  oxyCODONE (OXY IR/ROXICODONE) 5 MG immediate release tablet Take 1 tablet (5 mg total) by mouth every 6 (six) hours as needed for severe pain (pain score 7-10). 01/04/24  Yes Gherghe, Costin M, MD  sertraline (ZOLOFT) 25 MG tablet Take 25 mg by mouth daily. 12/23/23  Yes [provider]  lidocaine (LIDODERM) 5 % Place 1 patch onto the skin daily. Remove & Discard patch within 12 hours or as directed by MD 01/04/24   Osborn Blaze, MD      Allergies    Latex and Penicillins    Review of Systems   Review of Systems  Physical Exam Updated Vital Signs BP 139/83   Pulse (!) 101   Temp 97.9 F (36.6 C) (Oral)   Resp  19   Ht 5\' 1"  (1.549 m)   Wt 95.3 kg   SpO2 94%   BMI 39.68 kg/m  Physical Exam Vitals and nursing note reviewed.  Constitutional:      General: She is not in acute distress.    Appearance: She is well-developed.  HENT:     Head: Normocephalic and atraumatic.     Right Ear: External ear normal.     Left Ear: External ear normal.     Nose: Nose normal.  Eyes:     Extraocular Movements: Extraocular movements intact.     Conjunctiva/sclera: Conjunctivae normal.     Pupils: Pupils are equal, round, and reactive to light.  Cardiovascular:     Rate and Rhythm: Normal rate. Rhythm irregular.     Heart sounds: No murmur heard. Pulmonary:     Effort: Pulmonary effort is normal. No respiratory distress.     Breath sounds: Normal breath sounds.  Musculoskeletal:     Cervical back: Normal range of motion and neck supple.     Right lower leg: No edema.     Left lower leg: No edema.  Skin:    General: Skin is warm and dry.  Neurological:     Mental Status: She is alert and oriented to person, place, and time. Mental status  is at baseline.  Psychiatric:        Mood and Affect: Mood normal.     ED Results / Procedures / Treatments   Labs (all labs ordered are listed, but only abnormal results are displayed) Labs Reviewed  BASIC METABOLIC PANEL WITH GFR - Abnormal; Notable for the following components:      Result Value   Glucose, Bld 146 (*)    BUN 24 (*)    Calcium 8.6 (*)    GFR, Estimated 59 (*)    All other components within normal limits  CBC - Abnormal; Notable for the following components:   RBC 3.60 (*)    Hemoglobin 10.4 (*)    HCT 33.5 (*)    All other components within normal limits  BRAIN NATRIURETIC PEPTIDE - Abnormal; Notable for the following components:   B Natriuretic Peptide 244.1 (*)    All other components within normal limits  CBC - Abnormal; Notable for the following components:   RBC 3.29 (*)    Hemoglobin 9.7 (*)    HCT 30.9 (*)    All other  components within normal limits  TROPONIN I (HIGH SENSITIVITY) - Abnormal; Notable for the following components:   Troponin I (High Sensitivity) 21 (*)    All other components within normal limits  TROPONIN I (HIGH SENSITIVITY) - Abnormal; Notable for the following components:   Troponin I (High Sensitivity) 18 (*)    All other components within normal limits  MAGNESIUM  CREATININE, SERUM  COMPREHENSIVE METABOLIC PANEL WITH GFR  CBC    EKG EKG Interpretation Date/Time:  Thursday January 04 2024 15:14:32 EDT Ventricular Rate:  98 PR Interval:    QRS Duration:  101 QT Interval:  361 QTC Calculation: 461 R Axis:   -54  Text Interpretation: Atrial fibrillation Left anterior fascicular block Anterior infarct, old Confirmed by Shyrl Doyne [phone]) on 01/04/2024 3:31:30 PM  Radiology DG Chest Portable 1 View Result Date: 01/04/2024 CLINICAL DATA:  Syncope. EXAM: PORTABLE CHEST 1 VIEW COMPARISON:  January 02, 2024 FINDINGS: There is stable dual lead AICD positioning. The cardiac silhouette is mildly enlarged and unchanged in size. Low lung volumes are noted. Mild to moderate severity atelectasis and/or infiltrate is seen within the left lung base. No pleural effusion or pneumothorax is identified. The visualized skeletal structures are unremarkable. IMPRESSION: Low lung volumes with mild to moderate severity left basilar atelectasis and/or infiltrate. Electronically Signed   By: Virgle Grime M.D.   On: 01/04/2024 16:13   CT HEAD WO CONTRAST ( ) Result Date: 01/03/2024 CLINICAL DATA:  Syncope EXAM: CT HEAD WITHOUT CONTRAST TECHNIQUE: Contiguous axial images were obtained from the base of the skull through the vertex without intravenous contrast. RADIATION DOSE REDUCTION: This exam was performed according to the departmental dose-optimization program which includes automated exposure control, adjustment of the mA and/or kV according to patient size and/or use of iterative reconstruction  technique. COMPARISON:  12/29/2023 FINDINGS: Brain: There is no mass, hemorrhage or extra-axial collection. The size and configuration of the ventricles and extra-axial CSF spaces are normal. There is hypoattenuation of the white matter, most commonly indicating chronic small vessel disease. Vascular: Atherosclerotic calcification of the vertebral and internal carotid arteries at the skull base. No abnormal hyperdensity of the major intracranial arteries or dural venous sinuses. Skull: Negative Sinuses/Orbits: Right frontal, maxillary and sphenoid sinus mucosal thickening Other: None. IMPRESSION: 1. No acute intracranial abnormality. 2. Chronic small vessel disease. 3. Right frontal, maxillary and sphenoid sinus mucosal thickening. Electronically  Signed   By: Juanetta Nordmann M.D.   On: 01/03/2024 16:16   EEG adult Result Date: 01/03/2024 Arleene Lack, MD     01/03/2024 11:01 AM Patient Name: KAMERON GLAZEBROOK MRN: [id number] Epilepsy Attending: Arleene Lack Referring Physician/Provider: Osborn Blaze, MD Date: 01/03/2024 Duration: 24.09 mins Patient history: 83yo F s/p cardiac arrest. EEG to evaluate for seizure Level of alertness: Awake AEDs during EEG study: None Technical aspects: This EEG study was done with scalp electrodes positioned according to the 10-20 International system of electrode placement. Electrical activity was reviewed with band pass filter of 1-70Hz , sensitivity of 7 uV/mm, display speed of 76mm/sec with a 60Hz  notched filter applied as appropriate. EEG data were recorded continuously and digitally stored.  Video monitoring was available and reviewed as appropriate. Description: The posterior dominant rhythm consists of 8 Hz activity of moderate voltage (25-35 uV) seen predominantly in posterior head regions, symmetric and reactive to eye opening and eye closing. Hyperventilation and photic stimulation were not performed.   IMPRESSION: This study is within normal limits. No seizures or  epileptiform discharges were seen throughout the recording. A normal interictal EEG does not exclude the diagnosis of epilepsy. Priyanka O Yadav    Procedures Procedures    Medications Ordered in ED Medications  metoprolol  succinate (TOPROL -XL) 24 hr tablet 25 mg (has no administration in time range)  oxyCODONE (Oxy IR/ROXICODONE) immediate release tablet 5 mg (has no administration in time range)  sertraline (ZOLOFT) tablet 25 mg (has no administration in time range)  enoxaparin (LOVENOX) injection 40 mg (40 mg Subcutaneous Given 01/04/24 1856)  acetaminophen  (TYLENOL ) tablet 650 mg (has no administration in time range)    Or  acetaminophen  (TYLENOL ) suppository 650 mg (has no administration in time range)  ondansetron  (ZOFRAN ) tablet 4 mg (has no administration in time range)    Or  ondansetron  (ZOFRAN ) injection 4 mg (has no administration in time range)  lactated ringers bolus 1,000 mL (0 mLs Intravenous Stopped 01/04/24 1705)  potassium chloride SA (KLOR-CON M) CR tablet 40 mEq (40 mEq Oral Given 01/04/24 1633)  magnesium sulfate IVPB 2 g 50 mL (0 g Intravenous Stopped 01/04/24 1737)    ED Course/ Medical Decision Making/ A&P Clinical Course as of 01/04/24 2232  Thu Jan 04, 2024  1603 Caralyn Chandler PA from cardiology has been consulted.  States that general cardiology will see the patient. [RP]    Clinical Course User Index [RP] Ninetta Basket, MD                                 Medical Decision Making Amount and/or Complexity of Data Reviewed Labs: ordered. Radiology: ordered.  Risk Prescription drug management. Decision regarding hospitalization.   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    Initial Ddx:  Syncope, pacemaker malfunction, dehydration, MI, PE, orthostasis  MDM/Course:  Patient presents emergency department after syncopal event.  Was just admitted for this and did have  a cardiac arrest where she actually had to have compressions.  Had a pacemaker placed due to long sinus pauses.  Comes back to the emergency department immediately after being discharged with another event.  On exam she is overall well-appearing.  There were no traumatic injuries as result of her syncopal event and her daughter caught her.  Not having any other concerning symptoms at this time.  EKG obtained which shows atrial fibrillation without any signs of a paced rhythm.  No other concerning findings such as Brugada, long QT, or WPW.  Upon re-evaluation patient mains stable but due to her cardiac arrest recently will admit for observation.  Cardiology consulted prior to admission.  This patient presents to the ED for concern of complaints listed in HPI, this involves an extensive number of treatment options, and is a complaint that carries with it a high risk of complications and morbidity. Disposition including potential need for admission considered.   Dispo: Admit to Floor  Additional history obtained from daughter Records reviewed Outpatient Clinic Notes The following labs were independently interpreted: Chemistry and show no acute abnormality I independently reviewed the following imaging with scope of interpretation limited to determining acute life threatening conditions related to emergency care: Chest x-ray and agree with the radiologist interpretation with the following exceptions: none I personally reviewed and interpreted cardiac monitoring: atrial fibrillation (normal rate) I personally reviewed and interpreted the pt's EKG: see above for interpretation  I have reviewed the patients home medications and made adjustments as needed Consults: Cardiology and Hospitalist (Dr Lilyan Remedies) Social Determinants of health:  Geriatric  Portions of this note were generated with Scientist, clinical (histocompatibility and immunogenetics). Dictation errors may occur despite best attempts at proofreading.     Final Clinical  Impression(s) / ED Diagnoses Final diagnoses:  Syncope and collapse  Atrial fibrillation, unspecified type [facility])    Rx / DC Orders ED Discharge Orders     None         Ninetta Basket, MD 01/04/24 2232

## 2024-01-04 NOTE — ED Notes (Signed)
 Pt changed into clean gown and repositioned in bed. Family at bedside.

## 2024-01-04 NOTE — ED Notes (Signed)
 MD at [facility]

## 2024-01-04 NOTE — Telephone Encounter (Signed)
 Pharmacy Patient Advocate Encounter   Received notification from Inpatient Request that prior authorization for Lidocaine 5% patches is required/requested.   Insurance verification completed.   The patient is insured through [facility] [employer]/RX [employer] .   Per test claim: PA required; PA submitted to above mentioned insurance via CoverMyMeds Key/confirmation #/EOC [id number] Status is pending

## 2024-01-04 NOTE — ED Notes (Signed)
 Denies questions or needs, sx or complaints.

## 2024-01-04 NOTE — ED Notes (Signed)
 Please tell the patient that her son called and loves her

## 2024-01-05 ENCOUNTER — Other Ambulatory Visit (HOSPITAL_COMMUNITY): Payer: Self-pay

## 2024-01-05 DIAGNOSIS — I4891 Unspecified atrial fibrillation: Secondary | ICD-10-CM

## 2024-01-05 DIAGNOSIS — I482 Chronic atrial fibrillation, unspecified: Secondary | ICD-10-CM

## 2024-01-05 DIAGNOSIS — R55 Syncope and collapse: Secondary | ICD-10-CM | POA: Diagnosis not present

## 2024-01-05 LAB — CBC
HCT: 31 % — ABNORMAL LOW (ref 36.0–46.0)
Hemoglobin: 9.8 g/dL — ABNORMAL LOW (ref 12.0–15.0)
MCH: 30.1 pg (ref 26.0–34.0)
MCHC: 31.6 g/dL (ref 30.0–36.0)
MCV: 95.1 fL (ref 80.0–100.0)
Platelets: 184 10*3/uL (ref 150–400)
RBC: 3.26 MIL/uL — ABNORMAL LOW (ref 3.87–5.11)
RDW: 15 % (ref 11.5–15.5)
WBC: 7.4 10*3/uL (ref 4.0–10.5)
nRBC: 0 % (ref 0.0–0.2)

## 2024-01-05 LAB — COMPREHENSIVE METABOLIC PANEL WITH GFR
ALT: 13 U/L (ref 0–44)
AST: 20 U/L (ref 15–41)
Albumin: 3.2 g/dL — ABNORMAL LOW (ref 3.5–5.0)
Alkaline Phosphatase: 66 U/L (ref 38–126)
Anion gap: 9 (ref 5–15)
BUN: 26 mg/dL — ABNORMAL HIGH (ref 8–23)
CO2: 26 mmol/L (ref 22–32)
Calcium: 8.7 mg/dL — ABNORMAL LOW (ref 8.9–10.3)
Chloride: 104 mmol/L (ref 98–111)
Creatinine, Ser: 0.88 mg/dL (ref 0.44–1.00)
GFR, Estimated: >60 mL/min (ref >60)
Glucose, Bld: 94 mg/dL (ref 70–99)
Potassium: 3.9 mmol/L (ref 3.5–5.1)
Sodium: 139 mmol/L (ref 135–145)
Total Bilirubin: 1.5 mg/dL — ABNORMAL HIGH (ref 0.0–1.2)
Total Protein: 6.1 g/dL — ABNORMAL LOW (ref 6.5–8.1)

## 2024-01-05 NOTE — ED Notes (Signed)
 Admitting MD to bedside

## 2024-01-05 NOTE — Progress Notes (Signed)
 [facility] PROGRESS NOTE    Progress Note  Barbara Soto  RUE:[id number] DOB: 12-Apr-1941 DOA: 01/04/2024 PCP: Windell Hasty, DO     Brief Narrative:   Barbara Soto is an 83 y.o. female past medical history significant for essential hypertension, recently discharged from the hospital for syncopal episode after he was found at the bottom of 4 steps facedown, when he arrived in the ED he was found to be in A-fib with RVR, he arrested while in the hospital leading to CPR 12/30/2023, she had a complete workup for syncope except for an MRI CT scan and EEG were unremarkable.  EP consultation with a pacemaker implantation on 01/01/2024, despite pacemaker he was still having syncopal episodes while being hospitalized, then eventually became asymptomatic and discharge, was told to resume Eliquis on 01/07/2024.  When he arrived home he passed out as per daughter he lost consciousness for about 2 minutes there was no loss of control of sphincters no biting of the tongue.   Assessment/Plan:   Recurrent syncope: Recently discharged from syncope status post pacemaker placement. CT of the head and EEG in previous admission was negative, the patient denies loss of control of sphincters or biting of the tongue.  Is unlikely to be a seizure event. MRI of the brain could not be done, as it needs to be 6 weeks post pacemaker placement even though it is compatible. Beta-blocker was resumed, orthostatics were checked. Device interrogation showed no abnormalities, we will involve EP to reevaluate. She was not hypoglycemic echo does not show aortic stenosis, unlikely pulmonary embolism, there is a concern about carotid sinus hypersensitivity/carotid sinus syndrome she is older than 65, she does relate some lightheadedness with this episode.  Chronic kidney disease stage IIIa: Her creatinine appears to be at baseline.  Atrial fibrillation, chronic (HCC) Rate control less than 100 continue beta-blocker. She  is to resume her Eliquis on 01/07/2024  Recent left ankle injury: Follows up with orthopedic as an outpatient weightbearing as tolerated.  Morbid obesity: She has been counseled.   DVT prophylaxis: lovenox Family Communication:daughter Status is: Inpatient Remains inpatient appropriate because: Recurrent syncope    Code Status:     Code Status Orders  (From admission, onward)           Start     Ordered   01/04/24 1704  Full code  Continuous       Question:  By:  Answer:  Consent: discussion documented in EHR   01/04/24 1703           Code Status History     Date Active Date Inactive Code Status Order ID Comments User Context   01/04/2024 1520 01/04/2024 1704 Full Code [id number]  Ninetta Basket, MD ED   12/30/2023 2032 01/04/2024 1505 Full Code [id number]  Roxana Copier, DO Inpatient   12/30/2023 2020 12/30/2023 2032 Full Code [id number]  Katrine Parody, MD Inpatient   12/29/2023 1750 12/30/2023 2020 Limited: Do not attempt resuscitation (DNR) -DNR-LIMITED -Do Not Intubate/DNI  [id number]  Lesa Rape, MD ED   12/29/2023 1750 12/29/2023 1750 Do not attempt resuscitation (DNR) PRE-ARREST INTERVENTIONS DESIRED [id number]  Lesa Rape, MD ED   12/29/2023 1742 12/29/2023 1750 Full Code [id number]  Lesa Rape, MD ED         IV Access:   Peripheral IV   Procedures and diagnostic studies:   DG Chest Portable 1 View Result Date: 01/04/2024 CLINICAL DATA:  Syncope. EXAM: PORTABLE CHEST 1 VIEW COMPARISON:  January 02, 2024 FINDINGS: There is stable dual lead AICD positioning. The cardiac silhouette is mildly enlarged and unchanged in size. Low lung volumes are noted. Mild to moderate severity atelectasis and/or infiltrate is seen within the left lung base. No pleural effusion or pneumothorax is identified. The visualized skeletal structures are unremarkable. IMPRESSION: Low lung volumes with mild to moderate severity left basilar atelectasis and/or infiltrate. Electronically Signed    By: Virgle Grime M.D.   On: 01/04/2024 16:13   CT HEAD WO CONTRAST ( ) Result Date: 01/03/2024 CLINICAL DATA:  Syncope EXAM: CT HEAD WITHOUT CONTRAST TECHNIQUE: Contiguous axial images were obtained from the base of the skull through the vertex without intravenous contrast. RADIATION DOSE REDUCTION: This exam was performed according to the departmental dose-optimization program which includes automated exposure control, adjustment of the mA and/or kV according to patient size and/or use of iterative reconstruction technique. COMPARISON:  12/29/2023 FINDINGS: Brain: There is no mass, hemorrhage or extra-axial collection. The size and configuration of the ventricles and extra-axial CSF spaces are normal. There is hypoattenuation of the white matter, most commonly indicating chronic small vessel disease. Vascular: Atherosclerotic calcification of the vertebral and internal carotid arteries at the skull base. No abnormal hyperdensity of the major intracranial arteries or dural venous sinuses. Skull: Negative Sinuses/Orbits: Right frontal, maxillary and sphenoid sinus mucosal thickening Other: None. IMPRESSION: 1. No acute intracranial abnormality. 2. Chronic small vessel disease. 3. Right frontal, maxillary and sphenoid sinus mucosal thickening. Electronically Signed   By: Juanetta Nordmann M.D.   On: 01/03/2024 16:16   EEG adult Result Date: 01/03/2024 Arleene Lack, MD     01/03/2024 11:01 AM Patient Name: Barbara Soto MRN: [id number] Epilepsy Attending: Arleene Lack Referring Physician/Provider: Osborn Blaze, MD Date: 01/03/2024 Duration: 24.09 mins Patient history: 83yo F s/p cardiac arrest. EEG to evaluate for seizure Level of alertness: Awake AEDs during EEG study: None Technical aspects: This EEG study was done with scalp electrodes positioned according to the 10-20 International system of electrode placement. Electrical activity was reviewed with band pass filter of 1-70Hz , sensitivity of 7  uV/mm, display speed of 53mm/sec with a 60Hz  notched filter applied as appropriate. EEG data were recorded continuously and digitally stored.  Video monitoring was available and reviewed as appropriate. Description: The posterior dominant rhythm consists of 8 Hz activity of moderate voltage (25-35 uV) seen predominantly in posterior head regions, symmetric and reactive to eye opening and eye closing. Hyperventilation and photic stimulation were not performed.   IMPRESSION: This study is within normal limits. No seizures or epileptiform discharges were seen throughout the recording. A normal interictal EEG does not exclude the diagnosis of epilepsy. Arleene Lack     Medical Consultants:   None.   Subjective:    Barbara Soto she is currently asymptomatic except for her chest from her chest compression and pacer  Objective:    Vitals:   01/05/24 0200 01/05/24 0244 01/05/24 0300 01/05/24 0400  BP: 122/75  (!) 154/89   Pulse: 86  78   Resp: 15  20   Temp:  97.7 F (36.5 C)    TempSrc:  Oral    SpO2: 93%  100% 100%  Weight:      Height:       SpO2: 100 % O2 Flow Rate (L/min): 2 L/min   Intake/Output Summary (Last 24 hours) at 01/05/2024 0640 Last data filed at 01/05/2024 0136 Gross per 24 hour  Intake 1050 ml  Output 0 ml  Net 1050 ml   Filed Weights   01/04/24 1516  Weight: 95.3 kg    Exam: General exam: In no acute distress. Respiratory system: Good air movement and clear to auscultation. Cardiovascular system: S1 & S2 heard, RRR. No JVD. Gastrointestinal system: Abdomen is nondistended, soft and nontender.  Extremities: No pedal edema. Skin: No rashes, lesions or ulcers Psychiatry: Judgement and insight appear normal. Mood & affect appropriate.    Data Reviewed:    Labs: Basic Metabolic Panel: Recent Labs  Lab 12/30/23 1952 12/31/23 0447 01/01/24 0518 01/02/24 0547 01/03/24 0400 01/04/24 0421 01/04/24 1515 01/04/24 1714  NA 139   < > 136 138 137  138 137  --   K 4.2   < > 3.4* 3.7 3.5 3.6 3.6  --   CL 103   < > 102 103 101 103 101  --   CO2 27   < > 27 29 26 26 24   --   GLUCOSE 135*   < > 94 95 106* 120* 146*  --   BUN 13   < > 10 14 17 20  24*  --   CREATININE 1.21*   < > 0.93 0.86 0.80 1.01* 0.97 0.94  CALCIUM 8.5*   < > 8.1* 8.5* 8.6* 8.7* 8.6*  --   MG 1.9  --   --   --   --  1.8 1.9  --   PHOS  --   --   --   --   --  2.9  --   --    < > = values in this interval not displayed.   GFR Estimated Creatinine Clearance: 49.5 mL/min (by C-G formula based on SCr of 0.94 mg/dL). Liver Function Tests: Recent Labs  Lab 12/29/23 1337 12/30/23 1952 01/04/24 0421  AST 30 30 24   ALT 14 15 13   ALKPHOS 85 75 66  BILITOT 0.9 1.4* 1.7*  PROT 6.6 6.5 6.5  ALBUMIN 3.7 3.4* 3.2*   No results for input(s): "LIPASE", "AMYLASE" in the last 168 hours. No results for input(s): "AMMONIA" in the last 168 hours. Coagulation profile Recent Labs  Lab 12/29/23 1337  INR 1.2   COVID-19 Labs  No results for input(s): "DDIMER", "FERRITIN", "LDH", "CRP" in the last 72 hours.  No results found for: "SARSCOV2NAA"  CBC: Recent Labs  Lab 01/03/24 0400 01/04/24 0421 01/04/24 1515 01/04/24 1714 01/05/24 0543  WBC 7.4 8.2 8.1 7.3 7.4  HGB 9.9* 10.0* 10.4* 9.7* 9.8*  HCT 30.3* 31.2* 33.5* 30.9* 31.0*  MCV 92.1 92.6 93.1 93.9 95.1  PLT 155 190 188 164 184   Cardiac Enzymes: No results for input(s): "CKTOTAL", "CKMB", "CKMBINDEX", "TROPONINI" in the last 168 hours. BNP (last 3 results) No results for input(s): "PROBNP" in the last 8760 hours. CBG: Recent Labs  Lab 12/30/23 1847  GLUCAP 129*   D-Dimer: No results for input(s): "DDIMER" in the last 72 hours. Hgb A1c: No results for input(s): "HGBA1C" in the last 72 hours. Lipid Profile: No results for input(s): "CHOL", "HDL", "LDLCALC", "TRIG", "CHOLHDL", "LDLDIRECT" in the last 72 hours. Thyroid function studies: No results for input(s): "TSH", "T4TOTAL", "T3FREE", "THYROIDAB" in  the last 72 hours.  Invalid input(s): "FREET3" Anemia work up: No results for input(s): "VITAMINB12", "FOLATE", "FERRITIN", "TIBC", "IRON", "RETICCTPCT" in the last 72 hours. Sepsis Labs: Recent Labs  Lab 12/29/23 1342 12/30/23 0445 12/30/23 1952 12/31/23 0447 01/04/24 0421 01/04/24 1515 01/04/24 1714 01/05/24 0543  WBC  --    < >  --    < >  8.2 8.1 7.3 7.4  LATICACIDVEN 1.8  --  1.4  --   --   --   --   --    < > = values in this interval not displayed.   Microbiology Recent Results (from the past 240 hours)  Surgical PCR screen     Status: None   Collection Time: 12/31/23  8:40 AM   Specimen: Nasal Mucosa; Nasal Swab  Result Value Ref Range Status   MRSA, PCR NEGATIVE NEGATIVE Final   Staphylococcus aureus NEGATIVE NEGATIVE Final    Comment: (NOTE) The Xpert SA Assay (FDA approved for NASAL specimens in patients 53 years of age and older), is one component of a comprehensive surveillance program. It is not intended to diagnose infection nor to guide or monitor treatment. Performed at [facility], [address] [place], [place] [address]      Medications:    enoxaparin (LOVENOX) injection  40 mg Subcutaneous Q24H   metoprolol  succinate  25 mg Oral BID   sertraline  25 mg Oral Daily   Continuous Infusions:    LOS: 1 day   Barbara Soto  [facility]  01/05/2024, 6:40 AM

## 2024-01-05 NOTE — Telephone Encounter (Signed)
 Pharmacy Patient Advocate Encounter  Received notification from [facility] [employer] [facility] that Prior Authorization for Lidocaine 5% patches  has been DENIED.  Full denial letter will be uploaded to the media tab. See denial reason below.   PA #/Case ID/Reference #: [id number]

## 2024-01-05 NOTE — Progress Notes (Signed)
 Patient pacer check from yesterday (transmission) is reviewed Stable sensing and impedance In person device check bedside this afternoon by industry reports RV threshold remains stable  Reported 0.9/0.4 with output at 3.0/0.7 CLS response is programmed on high AFib noted No HVR episodes or pacer findings  to explain her syncope yesterday Device functioning normally  Mertha Abrahams, PA-C

## 2024-01-06 DIAGNOSIS — Z950 Presence of cardiac pacemaker: Secondary | ICD-10-CM

## 2024-01-06 DIAGNOSIS — I4891 Unspecified atrial fibrillation: Secondary | ICD-10-CM | POA: Diagnosis not present

## 2024-01-06 DIAGNOSIS — R55 Syncope and collapse: Secondary | ICD-10-CM | POA: Diagnosis not present

## 2024-01-06 NOTE — Progress Notes (Signed)
 No formal cardiology consult in chart EP saw yesterday and indicated no arrhythmia or  PPM malfunction noted on interrogation.  Recurrent syncope does not appear to be related To arrhythmia or PPM dysfunction Check posturals Cannot r/o vasodepressor response   Janelle Mediate MD [facility]

## 2024-01-06 NOTE — Plan of Care (Signed)
  Problem: Clinical Measurements: Goal: Ability to maintain clinical measurements within normal limits will improve Outcome: Progressing Goal: Cardiovascular complication will be avoided Outcome: Progressing   Problem: Pain Managment: Goal: General experience of comfort will improve and/or be controlled Outcome: Progressing

## 2024-01-06 NOTE — Progress Notes (Signed)
 [facility] PROGRESS NOTE    Progress Note  Barbara Soto  ZOX:[id number] DOB: October 04, 1940 DOA: 01/04/2024 PCP: Windell Hasty, DO     Brief Narrative:   Barbara Soto is an 83 y.o. female past medical history significant for essential hypertension, recently discharged from the hospital for syncopal episode after he was found at the bottom of 4 steps facedown, when he arrived in the ED he was found to be in A-fib with RVR, he arrested while in the hospital leading to CPR 12/30/2023, she had a complete workup for syncope except for an MRI CT scan and EEG were unremarkable.  EP consultation with a pacemaker implantation on 01/01/2024, despite pacemaker he was still having syncopal episodes while being hospitalized, then eventually became asymptomatic and discharge, was told to resume Eliquis  on 01/07/2024.  When he arrived home he passed out as per daughter he lost consciousness for about 2 minutes there was no loss of control of sphincters no biting of the tongue.  Assessment/Plan:   Recurrent syncope: Recently discharged from syncope status post pacemaker placement. Question EP cardiology, MRI of the brain was done She does relate some lightheadedness with this episode.  Chronic kidney disease stage IIIa: Her creatinine appears to be at baseline.  Atrial fibrillation, chronic (HCC) Rate control less than 100 continue beta-blocker. She is to resume her Eliquis  on 01/07/2024  Recent left ankle injury: Follows up with orthopedic as an outpatient weightbearing as tolerated.  Morbid obesity: She has been counseled.   DVT prophylaxis: lovenox  Family Communication:daughter Status is: Inpatient Remains inpatient appropriate because: Recurrent syncope    Code Status:     Code Status Orders  (From admission, onward)           Start     Ordered   01/04/24 1704  Full code  Continuous       Question:  By:  Answer:  Consent: discussion documented in EHR   01/04/24 1703            Code Status History     Date Active Date Inactive Code Status Order ID Comments User Context   01/04/2024 1520 01/04/2024 1704 Full Code [id number]  Barbara Basket, MD ED   12/30/2023 2032 01/04/2024 1505 Full Code [id number]  Barbara Copier, DO Inpatient   12/30/2023 2020 12/30/2023 2032 Full Code [id number]  Barbara Parody, MD Inpatient   12/29/2023 1750 12/30/2023 2020 Limited: Do not attempt resuscitation (DNR) -DNR-LIMITED -Do Not Intubate/DNI  [id number]  Lesa Rape, MD ED   12/29/2023 1750 12/29/2023 1750 Do not attempt resuscitation (DNR) PRE-ARREST INTERVENTIONS DESIRED [id number]  Lesa Rape, MD ED   12/29/2023 1742 12/29/2023 1750 Full Code [id number]  Lesa Rape, MD ED         IV Access:   Peripheral IV   Procedures and diagnostic studies:   DG Chest Portable 1 View Result Date: 01/04/2024 CLINICAL DATA:  Syncope. EXAM: PORTABLE CHEST 1 VIEW COMPARISON:  January 02, 2024 FINDINGS: There is stable dual lead AICD positioning. The cardiac silhouette is mildly enlarged and unchanged in size. Low lung volumes are noted. Mild to moderate severity atelectasis and/or infiltrate is seen within the left lung base. No pleural effusion or pneumothorax is identified. The visualized skeletal structures are unremarkable. IMPRESSION: Low lung volumes with mild to moderate severity left basilar atelectasis and/or infiltrate. Electronically Signed   By: Virgle Grime M.D.   On: 01/04/2024 16:13     Medical Consultants:   None.   Subjective:  Barbara Soto relates he feels better.  Objective:    Vitals:   01/05/24 1759 01/05/24 2021 01/06/24 0105 01/06/24 0731  BP: (!) 149/85 124/76 131/80 133/78  Pulse: 82 97 88 96  Resp: 19 (!) 22 (!) 25 20  Temp: 99 F (37.2 C) 99 F (37.2 C) 98.7 F (37.1 C) 97.7 F (36.5 C)  TempSrc: Oral Oral Oral Oral  SpO2: 98% 100% 100%   Weight:      Height:       SpO2: 100 % O2 Flow Rate (L/min): 3 L/min   Intake/Output Summary (Last  24 hours) at 01/06/2024 0815 Last data filed at 01/06/2024 0200 Gross per 24 hour  Intake 240 ml  Output 400 ml  Net -160 ml   Filed Weights   01/04/24 1516  Weight: 95.3 kg    Exam: General exam: In no acute distress. Respiratory system: Good air movement and clear to auscultation. Cardiovascular system: S1 & S2 heard, RRR. No JVD. Gastrointestinal system: Abdomen is nondistended, soft and nontender.  Extremities: No pedal edema. Skin: No rashes, lesions or ulcers Psychiatry: Judgement and insight appear normal. Mood & affect appropriate. Data Reviewed:    Labs: Basic Metabolic Panel: Recent Labs  Lab 12/30/23 1952 12/31/23 0447 01/02/24 0547 01/03/24 0400 01/04/24 0421 01/04/24 1515 01/04/24 1714 01/05/24 0543  NA 139   < > 138 137 138 137  --  139  K 4.2   < > 3.7 3.5 3.6 3.6  --  3.9  CL 103   < > 103 101 103 101  --  104  CO2 27   < > 29 26 26 24   --  26  GLUCOSE 135*   < > 95 106* 120* 146*  --  94  BUN 13   < > 14 17 20  24*  --  26*  CREATININE 1.21*   < > 0.86 0.80 1.01* 0.97 0.94 0.88  CALCIUM 8.5*   < > 8.5* 8.6* 8.7* 8.6*  --  8.7*  MG 1.9  --   --   --  1.8 1.9  --   --   PHOS  --   --   --   --  2.9  --   --   --    < > = values in this interval not displayed.   GFR Estimated Creatinine Clearance: 52.9 mL/min (by C-G formula based on SCr of 0.88 mg/dL). Liver Function Tests: Recent Labs  Lab 12/30/23 1952 01/04/24 0421 01/05/24 0543  AST 30 24 20   ALT 15 13 13   ALKPHOS 75 66 66  BILITOT 1.4* 1.7* 1.5*  PROT 6.5 6.5 6.1*  ALBUMIN  3.4* 3.2* 3.2*   No results for input(s): LIPASE, AMYLASE in the last 168 hours. No results for input(s): AMMONIA in the last 168 hours. Coagulation profile No results for input(s): INR, PROTIME in the last 168 hours.  COVID-19 Labs  No results for input(s): DDIMER, FERRITIN, LDH, CRP in the last 72 hours.  No results found for: SARSCOV2NAA  CBC: Recent Labs  Lab 01/03/24 0400  01/04/24 0421 01/04/24 1515 01/04/24 1714 01/05/24 0543  WBC 7.4 8.2 8.1 7.3 7.4  HGB 9.9* 10.0* 10.4* 9.7* 9.8*  HCT 30.3* 31.2* 33.5* 30.9* 31.0*  MCV 92.1 92.6 93.1 93.9 95.1  PLT 155 190 188 164 184   Cardiac Enzymes: No results for input(s): CKTOTAL, CKMB, CKMBINDEX, TROPONINI in the last 168 hours. BNP (last 3 results) No results for input(s): PROBNP in the  last 8760 hours. CBG: Recent Labs  Lab 12/30/23 1847  GLUCAP 129*   D-Dimer: No results for input(s): DDIMER in the last 72 hours. Hgb A1c: No results for input(s): HGBA1C in the last 72 hours. Lipid Profile: No results for input(s): CHOL, HDL, LDLCALC, TRIG, CHOLHDL, LDLDIRECT in the last 72 hours. Thyroid  function studies: No results for input(s): TSH, T4TOTAL, T3FREE, THYROIDAB in the last 72 hours.  Invalid input(s): FREET3 Anemia work up: No results for input(s): VITAMINB12, FOLATE, FERRITIN, TIBC, IRON, RETICCTPCT in the last 72 hours. Sepsis Labs: Recent Labs  Lab 12/30/23 1952 12/31/23 0447 01/04/24 0421 01/04/24 1515 01/04/24 1714 01/05/24 0543  WBC  --    < > 8.2 8.1 7.3 7.4  LATICACIDVEN 1.4  --   --   --   --   --    < > = values in this interval not displayed.   Microbiology Recent Results (from the past 240 hours)  Surgical PCR screen     Status: None   Collection Time: 12/31/23  8:40 AM   Specimen: Nasal Mucosa; Nasal Swab  Result Value Ref Range Status   MRSA, PCR NEGATIVE NEGATIVE Final   Staphylococcus aureus NEGATIVE NEGATIVE Final    Comment: (NOTE) The Xpert SA Assay (FDA approved for NASAL specimens in patients 3 years of age and older), is one component of a comprehensive surveillance program. It is not intended to diagnose infection nor to guide or monitor treatment. Performed at [facility], [address], [place], [place] [address]      Medications:    enoxaparin  (LOVENOX ) injection  40 mg Subcutaneous Q24H    metoprolol  succinate  25 mg Oral BID   sertraline   25 mg Oral Daily   Continuous Infusions:    LOS: 2 days   Macdonald Savoy  [facility]  01/06/2024, 8:15 AM

## 2024-01-07 DIAGNOSIS — R55 Syncope and collapse: Secondary | ICD-10-CM | POA: Diagnosis not present

## 2024-01-07 DIAGNOSIS — I4891 Unspecified atrial fibrillation: Secondary | ICD-10-CM | POA: Diagnosis not present

## 2024-01-07 MED ORDER — HYDRALAZINE HCL 10 MG PO TABS
10.0000 mg | ORAL_TABLET | Freq: Once | ORAL | Status: AC
  Administered 2024-01-07: 10 mg via ORAL
  Filled 2024-01-07: qty 1

## 2024-01-07 NOTE — Progress Notes (Addendum)
 [facility] PROGRESS NOTE    Progress Note  [name]  ZOX:[id number] DOB: 23-Oct-1940 DOA: 01/04/2024 PCP: Windell Hasty, DO     Brief Narrative:   Barbara Soto is an 83 y.o. female past medical history significant for essential hypertension, recently discharged from the hospital for syncopal episode after he was found at the bottom of 4 steps facedown, when he arrived in the ED he was found to be in A-fib with RVR, he arrested while in the hospital leading to CPR 12/30/2023, she had a complete workup for syncope except for an MRI, CT scan and EEG were unremarkable.  EP consultation with a pacemaker implantation on 01/01/2024, despite pacemaker he was still having syncopal episodes while being hospitalized, then eventually became asymptomatic and discharge, was told to resume Eliquis  on 01/07/2024.  When he arrived home he passed out as per daughter he lost consciousness for about 2 minutes there was no loss of control of sphincters no biting of the tongue.  Assessment/Plan:   Recurrent syncope: Recently discharged from syncope status post pacemaker placement. Awaiting EP for consult. Neurology has been consulted.  Chronic kidney disease stage IIIa: Her creatinine appears to be at baseline.  Atrial fibrillation, chronic (HCC) Rate control less than 100 continue beta-blocker. Eliquis  currently on hold. EP and cardiology to dictate when to start anticoagulation and if she is a candidate.  Recent left ankle injury: Follows up with orthopedic as an outpatient weightbearing as tolerated.  Morbid obesity: She has been counseled.   DVT prophylaxis: lovenox  Family Communication:daughter Status is: Inpatient Remains inpatient appropriate because: Recurrent syncope    Code Status:     Code Status Orders  (From admission, onward)           Start     Ordered   01/04/24 1704  Full code  Continuous       Question:  By:  Answer:  Consent: discussion documented in EHR    01/04/24 1703           Code Status History     Date Active Date Inactive Code Status Order ID Comments User Context   01/04/2024 1520 01/04/2024 1704 Full Code [id number]  Ninetta Basket, MD ED   12/30/2023 2032 01/04/2024 1505 Full Code [id number]  Roxana Copier, DO Inpatient   12/30/2023 2020 12/30/2023 2032 Full Code [id number]  Katrine Parody, MD Inpatient   12/29/2023 1750 12/30/2023 2020 Limited: Do not attempt resuscitation (DNR) -DNR-LIMITED -Do Not Intubate/DNI  [id number]  Lesa Rape, MD ED   12/29/2023 1750 12/29/2023 1750 Do not attempt resuscitation (DNR) PRE-ARREST INTERVENTIONS DESIRED [id number]  Lesa Rape, MD ED   12/29/2023 1742 12/29/2023 1750 Full Code [id number]  Lesa Rape, MD ED         IV Access:   Peripheral IV   Procedures and diagnostic studies:   No results found.    Medical Consultants:   None.   Subjective:    Etter Hermann no complaints this morning.  Objective:    Vitals:   01/06/24 1545 01/06/24 1927 01/07/24 0014 01/07/24 0355  BP: (!) 151/86 (!) 162/101 (!) 153/98 (!) 181/82  Pulse: 96 96 90 91  Resp: (!) 21 20 20 20   Temp: 97.7 F (36.5 C) 98.8 F (37.1 C) 98.1 F (36.7 C) 98.4 F (36.9 C)  TempSrc: Oral Oral Oral Oral  SpO2: 100% 100% 100% 100%  Weight:    103.7 kg  Height:       SpO2: 100 %  O2 Flow Rate (L/min): 3 L/min   Intake/Output Summary (Last 24 hours) at 01/07/2024 0724 Last data filed at 01/07/2024 0600 Gross per 24 hour  Intake 240 ml  Output 550 ml  Net -310 ml   Filed Weights   01/04/24 1516 01/07/24 0355  Weight: 95.3 kg 103.7 kg    Exam: General exam: In no acute distress. Respiratory system: Good air movement and clear to auscultation. Cardiovascular system: S1 & S2 heard, RRR. No JVD. Gastrointestinal system: Abdomen is nondistended, soft and nontender.  Extremities: No pedal edema. Skin: No rashes, lesions or ulcers Psychiatry: Judgement and insight appear normal. Mood & affect  appropriate. Data Reviewed:    Labs: Basic Metabolic Panel: Recent Labs  Lab 01/02/24 0547 01/03/24 0400 01/04/24 0421 01/04/24 1515 01/04/24 1714 01/05/24 0543  NA 138 137 138 137  --  139  K 3.7 3.5 3.6 3.6  --  3.9  CL 103 101 103 101  --  104  CO2 29 26 26 24   --  26  GLUCOSE 95 106* 120* 146*  --  94  BUN 14 17 20  24*  --  26*  CREATININE 0.86 0.80 1.01* 0.97 0.94 0.88  CALCIUM 8.5* 8.6* 8.7* 8.6*  --  8.7*  MG  --   --  1.8 1.9  --   --   PHOS  --   --  2.9  --   --   --    GFR Estimated Creatinine Clearance: 55.6 mL/min (by C-G formula based on SCr of 0.88 mg/dL). Liver Function Tests: Recent Labs  Lab 01/04/24 0421 01/05/24 0543  AST 24 20  ALT 13 13  ALKPHOS 66 66  BILITOT 1.7* 1.5*  PROT 6.5 6.1*  ALBUMIN  3.2* 3.2*   No results for input(s): LIPASE, AMYLASE in the last 168 hours. No results for input(s): AMMONIA in the last 168 hours. Coagulation profile No results for input(s): INR, PROTIME in the last 168 hours.  COVID-19 Labs  No results for input(s): DDIMER, FERRITIN, LDH, CRP in the last 72 hours.  No results found for: SARSCOV2NAA  CBC: Recent Labs  Lab 01/03/24 0400 01/04/24 0421 01/04/24 1515 01/04/24 1714 01/05/24 0543  WBC 7.4 8.2 8.1 7.3 7.4  HGB 9.9* 10.0* 10.4* 9.7* 9.8*  HCT 30.3* 31.2* 33.5* 30.9* 31.0*  MCV 92.1 92.6 93.1 93.9 95.1  PLT 155 190 188 164 184   Cardiac Enzymes: No results for input(s): CKTOTAL, CKMB, CKMBINDEX, TROPONINI in the last 168 hours. BNP (last 3 results) No results for input(s): PROBNP in the last 8760 hours. CBG: No results for input(s): GLUCAP in the last 168 hours.  D-Dimer: No results for input(s): DDIMER in the last 72 hours. Hgb A1c: No results for input(s): HGBA1C in the last 72 hours. Lipid Profile: No results for input(s): CHOL, HDL, LDLCALC, TRIG, CHOLHDL, LDLDIRECT in the last 72 hours. Thyroid  function studies: No results for  input(s): TSH, T4TOTAL, T3FREE, THYROIDAB in the last 72 hours.  Invalid input(s): FREET3 Anemia work up: No results for input(s): VITAMINB12, FOLATE, FERRITIN, TIBC, IRON, RETICCTPCT in the last 72 hours. Sepsis Labs: Recent Labs  Lab 01/04/24 0421 01/04/24 1515 01/04/24 1714 01/05/24 0543  WBC 8.2 8.1 7.3 7.4   Microbiology Recent Results (from the past 240 hours)  Surgical PCR screen     Status: None   Collection Time: 12/31/23  8:40 AM   Specimen: Nasal Mucosa; Nasal Swab  Result Value Ref Range Status   MRSA, PCR NEGATIVE NEGATIVE  Final   Staphylococcus aureus NEGATIVE NEGATIVE Final    Comment: (NOTE) The Xpert SA Assay (FDA approved for NASAL specimens in patients 42 years of age and older), is one component of a comprehensive surveillance program. It is not intended to diagnose infection nor to guide or monitor treatment. Performed at [facility], [address] [place], [place] [address]      Medications:    enoxaparin  (LOVENOX ) injection  40 mg Subcutaneous Q24H   metoprolol  succinate  25 mg Oral BID   sertraline   25 mg Oral Daily   Continuous Infusions:    LOS: 3 days   Macdonald Savoy  [facility]  01/07/2024, 7:24 AM

## 2024-01-07 NOTE — Consult Note (Incomplete)
 NEUROLOGY CONSULT NOTE   Date of service: January 07, 2024 Patient Name: Barbara Soto MRN:  [id number] DOB:  1940-11-13 Chief Complaint: *** Requesting Provider: Macdonald Savoy, MD  History of Present Illness  Barbara Soto is a 83 y.o. female with hx of ***  LKW: *** Modified rankin score: {Modified Rankin Scale:21264} IV Thrombolysis: ***Yes, *** No (reason) EVT: ***Yes, *** No (reason) ICH Score:***  NIHSS components Score: Comment  1a Level of Conscious 0[]  1[]  2[]  3[]      1b LOC Questions 0[]  1[]  2[]       1c LOC Commands 0[]  1[]  2[]       2 Best Gaze 0[]  1[]  2[]       3 Visual 0[]  1[]  2[]  3[]      4 Facial Palsy 0[]  1[]  2[]  3[]      5a Motor Arm - left 0[]  1[]  2[]  3[]  4[]  UN[]    5b Motor Arm - Right 0[]  1[]  2[]  3[]  4[]  UN[]    6a Motor Leg - Left 0[]  1[]  2[]  3[]  4[]  UN[]    6b Motor Leg - Right 0[]  1[]  2[]  3[]  4[]  UN[]    7 Limb Ataxia 0[]  1[]  2[]  UN[]      8 Sensory 0[]  1[]  2[]  UN[]      9 Best Language 0[]  1[]  2[]  3[]      10 Dysarthria 0[]  1[]  2[]  UN[]      11 Extinct. and Inattention 0[]  1[]  2[]       TOTAL:       ROS  ***Comprehensive ROS performed and pertinent positives documented in HPI  ***Unable to ascertain due to ***  Past History  No past medical history on file.  Past Surgical History:  Procedure Laterality Date   PACEMAKER IMPLANT N/A 01/01/2024   Procedure: PACEMAKER IMPLANT;  Surgeon: Boyce Byes, MD;  Location: [facility] IN[facility];  Service: Cardiovascular;  Laterality: N/A;    Family History: No family history on file.  Social History  reports that she has never smoked. She has never used smokeless tobacco. She reports that she does not drink alcohol  and does not use drugs.  Allergies  Allergen Reactions   Latex Rash   Penicillins Other (See Comments)    -Pt stated that this medication does her no good    Medications   Current Facility-Administered Medications:    acetaminophen  (TYLENOL ) tablet 650 mg, 650 mg, Oral, Q6H PRN,  650 mg at 01/07/24 0407 **OR** acetaminophen  (TYLENOL ) suppository 650 mg, 650 mg, Rectal, Q6H PRN, Modena Andes, MD   enoxaparin  (LOVENOX ) injection 40 mg, 40 mg, Subcutaneous, Q24H, Pahwani, Ravi, MD, 40 mg at 01/06/24 1753   metoprolol  succinate (TOPROL -XL) 24 hr tablet 25 mg, 25 mg, Oral, BID, Pahwani, Ravi, MD, 25 mg at 01/06/24 2020   ondansetron  (ZOFRAN ) tablet 4 mg, 4 mg, Oral, Q6H PRN **OR** ondansetron  (ZOFRAN ) injection 4 mg, 4 mg, Intravenous, Q6H PRN, Pahwani, Ravi, MD   oxyCODONE  (Oxy IR/ROXICODONE ) immediate release tablet 5 mg, 5 mg, Oral, Q6H PRN, Pahwani, Ravi, MD, 5 mg at 01/07/24 0407   sertraline  (ZOLOFT ) tablet 25 mg, 25 mg, Oral, Daily, Pahwani, Ravi, MD, 25 mg at 01/06/24 0941  Vitals   Vitals:   01/06/24 1927 01/07/24 0014 01/07/24 0355 01/07/24 0727  BP: (!) 162/101 (!) 153/98 (!) 181/82 (!) 157/99  Pulse: 96 90 91 89  Resp: 20 20 20 20   Temp: 98.8 F (37.1 C) 98.1 F (36.7 C) 98.4 F (36.9 C) 97.7 F (36.5 C)  TempSrc: Oral Oral Oral Oral  SpO2: 100% 100% 100% 100%  Weight:   103.7 kg   Height:        Body mass index is 43.2 kg/m.   Physical Exam   Constitutional: Appears well-developed and well-nourished. *** Psych: Affect appropriate to situation. *** Eyes: No scleral injection. *** HENT: No OP obstruction. *** Head: Normocephalic. *** Cardiovascular: Normal rate and regular rhythm. *** Respiratory: Effort normal, non-labored breathing. *** GI: Soft.  No distension. There is no tenderness. *** Skin: WDI. ***  Neurologic Examination   ***  Labs/Imaging/Neurodiagnostic studies   CBC:  Recent Labs  Lab 01-29-24 1714 01/05/24 0543  WBC 7.3 7.4  HGB 9.7* 9.8*  HCT 30.9* 31.0*  MCV 93.9 95.1  PLT 164 184   Basic Metabolic Panel:  Lab Results  Component Value Date   NA 139 01/05/2024   K 3.9 01/05/2024   CO2 26 01/05/2024   GLUCOSE 94 01/05/2024   BUN 26 (H) 01/05/2024   CREATININE 0.88 01/05/2024   CALCIUM 8.7 (L) 01/05/2024    GFRNONAA >60 01/05/2024   Lipid Panel: No results found for: LDLCALC HgbA1c: No results found for: HGBA1C Urine Drug Screen: No results found for: LABOPIA, COCAINSCRNUR, LABBENZ, AMPHETMU, THCU, LABBARB  Alcohol  Level     Component Value Date/Time   ETH <15 12/29/2023 1337   INR  Lab Results  Component Value Date   INR 1.2 12/29/2023   APTT No results found for: APTT AED levels: No results found for: PHENYTOIN, ZONISAMIDE, LAMOTRIGINE, LEVETIRACETA  CT Head without contrast(Personally reviewed): ***  CT angio Head and Neck with contrast(Personally reviewed): ***  MR Angio head without contrast and Carotid Duplex BL(Personally reviewed): ***  MRI Brain(Personally reviewed): ***  Neurodiagnostics rEEG:  ***  ASSESSMENT   Barbara Soto is a 83 y.o. female ***  RECOMMENDATIONS  *** ______________________________________________________________________    Cherl Corner, NP [facility]

## 2024-01-07 NOTE — Consult Note (Signed)
   Electrophysiology consultation   Patient ID: Barbara Soto MRN: [id number]; DOB: [birth year]  Admit date: 01/04/2024 Date of Consult: 01/07/2024  PCP:  Windell Hasty, DO   History of Present Illness:   Barbara Soto is an 83 year old woman who I am seeing today for an evaluation of recurrent syncope.  She was recently admitted for recurrent syncope with personal injury.  Telemetry during the syncopal episodes showed profound bradycardia.  Per telemetry and symptomatology were consistent with malignant vasovagal syncope.  Given the profound bradycardia a dual-chamber permanent pacemaker was implanted in hopes that restoring permanent Trippi would improve her symptoms.  Unfortunately after pacemaker was implanted she is continued to experience episodes of lightheadedness and syncope.  EEG and brain imaging have been unrevealing.  This morning she describes the episode that triggered her presentation back to the hospital.  She says she was with her daughter.  Her daughter tells her that she became unresponsive for 2 to 3 seconds.  She did not completely blackout or fall.  She then returned to her normal state of health.  It was a single episode.  This differs from the description in the admission H&P which describes the episode lasting 2 minutes and included loss of urinary control.   Past medical, surgical, social and family history reviewed.  ROS:  Please see the history of present illness.  All other ROS reviewed and negative.     Physical Exam/Data:   Vitals:   01/06/24 1927 01/07/24 0014 01/07/24 0355 01/07/24 0727  BP: (!) 162/101 (!) 153/98 (!) 181/82 (!) 157/99  Pulse: 96 90 91 89  Resp: 20 20 20 20   Temp: 98.8 F (37.1 C) 98.1 F (36.7 C) 98.4 F (36.9 C) 97.7 F (36.5 C)  TempSrc: Oral Oral Oral Oral  SpO2: 100% 100% 100% 100%  Weight:   103.7 kg   Height:        General:  Well nourished, well developed, in no acute distress Cardiac:  normal S1, S2; RRR; no murmur.   Pacemaker site healing well Lungs:  clear to auscultation bilaterally, no wheezing, rhonchi or rales  Psych:  Normal affect   EKG:  The EKG was personally reviewed and demonstrates: Atrial fibrillation  Chest x-ray personally reviewed shows dual-chamber permanent pacemaker in appropriate position.  Telemetry:  Telemetry was personally reviewed and demonstrates: Atrial fibrillation  January 07, 2024 pacemaker interrogation personally performed demonstrates stable device function.  No ventricular arrhythmias.  Persistent atrial fibrillation.  Programmed DDD CLS with a base rate of 60.   Assessment and Plan:   #Recurrent syncope Per history telemetry findings during prior syncopal episode are suggestive of malignant vasovagal syncope with a prominent vasodilatory component.  She also had bradycardia during prior episode and thus a permanent pacemaker was implanted.  Her pacemaker is functioning appropriately but she continues to have episodes.    I would recommend ruling out any neurological cause with neurology consult.  While we typically do not scan patients with MRI shortly after permanent pacemaker implant, I do think that the benefits of scanning her with the MRI outweigh any risks.  I would be in favor of this to more thoroughly evaluate her condition.  Avoid hypotension.  I do not think there is a role for midodrine given her baseline blood pressures.  I recommend compression garments to the thigh or waist if possible.  Okay to liberalize sodium intake.    Donelda Fujita T. Marven Slimmer, MD, [facility], [facility] Cardiac Electrophysiology

## 2024-01-07 NOTE — Plan of Care (Signed)
 Pt A&Ox4, VSS. On 3L [place] when RN first came onshift; weaned down to RA. No tele orders; tele discontinued. Pt on eliquis  for afib. Pt still c/o chest pain-pain related to when she received chest compressions and ribs/sternum are now sore. PW in place; patient's urine started out orange/pink color but transitioned to dark amber by end of shift . Pt states she was told 'to drink more' but that her urine is normally yellow at home. 2 PIVs; SL. Scattered bruising on skin; abrasions on knees and outer R face from previous fall prior to admission. PPM is covered with steristrips in place. Call light within reach.  Problem: Education: Goal: Knowledge of General Education information will improve Description: Including pain rating scale, medication(s)/side effects and non-pharmacologic comfort measures Outcome: Progressing   Problem: Health Behavior/Discharge Planning: Goal: Ability to manage health-related needs will improve Outcome: Progressing   Problem: Clinical Measurements: Goal: Ability to maintain clinical measurements within normal limits will improve Outcome: Progressing Goal: Will remain free from infection Outcome: Progressing Goal: Diagnostic test results will improve Outcome: Progressing Goal: Respiratory complications will improve Outcome: Progressing Goal: Cardiovascular complication will be avoided Outcome: Progressing   Problem: Activity: Goal: Risk for activity intolerance will decrease Outcome: Progressing   Problem: Nutrition: Goal: Adequate nutrition will be maintained Outcome: Progressing   Problem: Coping: Goal: Level of anxiety will decrease Outcome: Progressing   Problem: Elimination: Goal: Will not experience complications related to bowel motility Outcome: Progressing Goal: Will not experience complications related to urinary retention Outcome: Progressing   Problem: Pain Managment: Goal: General experience of comfort will improve and/or be  controlled Outcome: Progressing   Problem: Safety: Goal: Ability to remain free from injury will improve Outcome: Progressing   Problem: Skin Integrity: Goal: Risk for impaired skin integrity will decrease Outcome: Progressing

## 2024-01-08 ENCOUNTER — Inpatient Hospital Stay (HOSPITAL_COMMUNITY)

## 2024-01-08 DIAGNOSIS — R55 Syncope and collapse: Secondary | ICD-10-CM | POA: Diagnosis not present

## 2024-01-08 DIAGNOSIS — I4891 Unspecified atrial fibrillation: Secondary | ICD-10-CM | POA: Diagnosis not present

## 2024-01-08 MED ORDER — MELATONIN 3 MG PO TABS
3.0000 mg | ORAL_TABLET | Freq: Every day | ORAL | Status: DC
  Administered 2024-01-08 – 2024-01-09 (×2): 3 mg via ORAL
  Filled 2024-01-08 (×2): qty 1

## 2024-01-08 NOTE — Progress Notes (Signed)
 LTM EEG hooked up and running - no initial skin breakdown - push button tested - [facility] monitoring.

## 2024-01-08 NOTE — Progress Notes (Signed)
 [facility] PROGRESS NOTE    Progress Note  Barbara Soto  WJX:[id number] DOB: 1941-07-02 DOA: 01/04/2024 PCP: Windell Hasty, DO     Brief Narrative:   Barbara Soto is an 83 y.o. female past medical history significant for essential hypertension, recently discharged from the hospital for syncopal episode after he was found at the bottom of 4 steps facedown, when he arrived in the ED he was found to be in A-fib with RVR, he arrested while in the hospital leading to CPR 12/30/2023, she had a complete workup for syncope except for an MRI, CT scan and EEG were unremarkable.  EP consultation with a pacemaker implantation on 01/01/2024, despite pacemaker he was still having syncopal episodes while being hospitalized, then eventually became asymptomatic and discharge, was told to resume Eliquis  on 01/07/2024.  When he arrived home he passed out as per daughter he lost consciousness for about 2 minutes there was no loss of control of sphincters no biting of the tongue.  Assessment/Plan:   Recurrent syncope: EP relates no cardiac causes. Okay to MRI of the brain. Neurology has been consulted.  Chronic kidney disease stage IIIa: Her creatinine appears to be at baseline.  Atrial fibrillation, chronic (HCC) Rate control less than 100 continue beta-blocker. Eliquis  currently on hold. EP and cardiology to dictate when to start anticoagulation and if she is a candidate.  Recent left ankle injury: Follows up with orthopedic as an outpatient weightbearing as tolerated.  Morbid obesity: She has been counseled.   DVT prophylaxis: lovenox  Family Communication:daughter Status is: Inpatient Remains inpatient appropriate because: Recurrent syncope    Code Status:     Code Status Orders  (From admission, onward)           Start     Ordered   01/04/24 1704  Full code  Continuous       Question:  By:  Answer:  Consent: discussion documented in EHR   01/04/24 1703            Code Status History     Date Active Date Inactive Code Status Order ID Comments User Context   01/04/2024 1520 01/04/2024 1704 Full Code [id number]  Ninetta Basket, MD ED   12/30/2023 2032 01/04/2024 1505 Full Code [id number]  Roxana Copier, DO Inpatient   12/30/2023 2020 12/30/2023 2032 Full Code [id number]  Katrine Parody, MD Inpatient   12/29/2023 1750 12/30/2023 2020 Limited: Do not attempt resuscitation (DNR) -DNR-LIMITED -Do Not Intubate/DNI  [id number]  Lesa Rape, MD ED   12/29/2023 1750 12/29/2023 1750 Do not attempt resuscitation (DNR) PRE-ARREST INTERVENTIONS DESIRED [id number]  Lesa Rape, MD ED   12/29/2023 1742 12/29/2023 1750 Full Code [id number]  Lesa Rape, MD ED         IV Access:   Peripheral IV   Procedures and diagnostic studies:   No results found.    Medical Consultants:   None.   Subjective:    Barbara Soto no complaints this morning.  Objective:    Vitals:   01/07/24 2004 01/08/24 0208 01/08/24 0448 01/08/24 0740  BP: (!) 142/85 (!) 156/82 (!) 178/88 (!) 160/90  Pulse: (!) 110 (!) 104 (!) 101 (!) 105  Resp: 18 18 18 18   Temp: 99 F (37.2 C) 99.1 F (37.3 C) 97.8 F (36.6 C) 97.7 F (36.5 C)  TempSrc: Oral Oral Oral Oral  SpO2: 92% 94% 92% 94%  Weight:  100.4 kg    Height:  SpO2: 94 % O2 Flow Rate (L/min): 1 L/min   Intake/Output Summary (Last 24 hours) at 01/08/2024 0822 Last data filed at 01/08/2024 0810 Gross per 24 hour  Intake 360 ml  Output 300 ml  Net 60 ml   Filed Weights   01/04/24 1516 01/07/24 0355 01/08/24 0208  Weight: 95.3 kg 103.7 kg 100.4 kg    Exam: General exam: In no acute distress. Respiratory system: Good air movement and clear to auscultation. Cardiovascular system: S1 & S2 heard, RRR. No JVD. Gastrointestinal system: Abdomen is nondistended, soft and nontender.  Extremities: No pedal edema. Skin: No rashes, lesions or ulcers Psychiatry: Judgement and insight appear normal. Mood & affect  appropriate. Data Reviewed:    Labs: Basic Metabolic Panel: Recent Labs  Lab 01/02/24 0547 01/03/24 0400 01/04/24 0421 01/04/24 1515 01/04/24 1714 01/05/24 0543  NA 138 137 138 137  --  139  K 3.7 3.5 3.6 3.6  --  3.9  CL 103 101 103 101  --  104  CO2 29 26 26 24   --  26  GLUCOSE 95 106* 120* 146*  --  94  BUN 14 17 20  24*  --  26*  CREATININE 0.86 0.80 1.01* 0.97 0.94 0.88  CALCIUM 8.5* 8.6* 8.7* 8.6*  --  8.7*  MG  --   --  1.8 1.9  --   --   PHOS  --   --  2.9  --   --   --    GFR Estimated Creatinine Clearance: 54.5 mL/min (by C-G formula based on SCr of 0.88 mg/dL). Liver Function Tests: Recent Labs  Lab 01/04/24 0421 01/05/24 0543  AST 24 20  ALT 13 13  ALKPHOS 66 66  BILITOT 1.7* 1.5*  PROT 6.5 6.1*  ALBUMIN  3.2* 3.2*   No results for input(s): LIPASE, AMYLASE in the last 168 hours. No results for input(s): AMMONIA in the last 168 hours. Coagulation profile No results for input(s): INR, PROTIME in the last 168 hours.  COVID-19 Labs  No results for input(s): DDIMER, FERRITIN, LDH, CRP in the last 72 hours.  No results found for: SARSCOV2NAA  CBC: Recent Labs  Lab 01/03/24 0400 01/04/24 0421 01/04/24 1515 01/04/24 1714 01/05/24 0543  WBC 7.4 8.2 8.1 7.3 7.4  HGB 9.9* 10.0* 10.4* 9.7* 9.8*  HCT 30.3* 31.2* 33.5* 30.9* 31.0*  MCV 92.1 92.6 93.1 93.9 95.1  PLT 155 190 188 164 184   Cardiac Enzymes: No results for input(s): CKTOTAL, CKMB, CKMBINDEX, TROPONINI in the last 168 hours. BNP (last 3 results) No results for input(s): PROBNP in the last 8760 hours. CBG: No results for input(s): GLUCAP in the last 168 hours.  D-Dimer: No results for input(s): DDIMER in the last 72 hours. Hgb A1c: No results for input(s): HGBA1C in the last 72 hours. Lipid Profile: No results for input(s): CHOL, HDL, LDLCALC, TRIG, CHOLHDL, LDLDIRECT in the last 72 hours. Thyroid  function studies: No results for  input(s): TSH, T4TOTAL, T3FREE, THYROIDAB in the last 72 hours.  Invalid input(s): FREET3 Anemia work up: No results for input(s): VITAMINB12, FOLATE, FERRITIN, TIBC, IRON, RETICCTPCT in the last 72 hours. Sepsis Labs: Recent Labs  Lab 01/04/24 0421 01/04/24 1515 01/04/24 1714 01/05/24 0543  WBC 8.2 8.1 7.3 7.4   Microbiology Recent Results (from the past 240 hours)  Surgical PCR screen     Status: None   Collection Time: 12/31/23  8:40 AM   Specimen: Nasal Mucosa; Nasal Swab  Result Value Ref Range  Status   MRSA, PCR NEGATIVE NEGATIVE Final   Staphylococcus aureus NEGATIVE NEGATIVE Final    Comment: (NOTE) The Xpert SA Assay (FDA approved for NASAL specimens in patients 38 years of age and older), is one component of a comprehensive surveillance program. It is not intended to diagnose infection nor to guide or monitor treatment. Performed at [facility], [address], [place], [place] [address]      Medications:    enoxaparin  (LOVENOX ) injection  40 mg Subcutaneous Q24H   metoprolol  succinate  25 mg Oral BID   sertraline   25 mg Oral Daily   Continuous Infusions:    LOS: 4 days   Macdonald Savoy  [facility]  01/08/2024, 8:22 AM

## 2024-01-08 NOTE — Plan of Care (Signed)
   Problem: Coping: Goal: Level of anxiety will decrease Outcome: Progressing

## 2024-01-08 NOTE — Consult Note (Addendum)
 NEUROLOGY CONSULT NOTE   Date of service: January 08, 2024 Patient Name: Barbara Soto MRN:  [id number] DOB:  28-Oct-1940 Chief Complaint: Seizures versus syncopal spells Requesting Provider: Macdonald Savoy, MD  History of Present Illness  [name] is a 83 y.o. female with a PMHx of depression and HTN who presented to the hospital after several syncopal-like events at home. Per daughter, all of the events have occurred while standing. First event was a week ago Friday that involved LOC that appeared to be due to fainting, with a fall striking the right side of her face and head - per daughter she just fell out. LOC lasted for about 2 minutes and patient may have been slightly confused for a short period afterwards - was seen by daughter on a video camera. Per Hospitalist admission note on 5/30 EMS after found down at the bottom of 4 steps with face down in the dirt & body on concrete and was unconscious. Unknown downtime and when EMS arrived and rolled her over she was still unconscious with agonal breathing, c-collar was applied. On the way patient was vomiting violently, becoming more alert awake and on arrival alert awake oriented x 2. She reported not remembering falling but did recall feeling funny prior to the fall. She was newly diagnosed with atrial fibrillation at last admission and was started on anticoagulation. Of note, she had significant cardiac pauses and cardiac arrest leading to CPR last admission, followed by evaluation by EP and PPM implantation by EP on 01/01/2024. Despite of having PPM, patient was still having episodes of syncope while in the hospital last admission; however, the last 2 to 3 days she was asymptomatic. She was discharged home on 6/5 but returned to the hospital the same day after she had another spell at home on the day of her discharge. The spell was witnessed by daughter, during which the patient was able to get into a chair, then lost consciousness with  stiffening of her limbs and slight shaking; this spell lasted about 2 minutes with mild confusion afterwards as well. No tongue biting or urinary incontinence. She has also had presyncopal-like symptoms during this admission as well as a syncopal spell while attempting to go the the toilet by her bed, during which she lost control of her bladder in association with LOC - there was no shaking with that spell.     ROS  As per HPI. Does not endorse any new symptoms during bedside evaluation today.   Past History  No past medical history on file.  Past Surgical History:  Procedure Laterality Date   PACEMAKER IMPLANT N/A 01/01/2024   Procedure: PACEMAKER IMPLANT;  Surgeon: Boyce Byes, MD;  Location: [facility] INVASIVE CV LAB;  Service: Cardiovascular;  Laterality: N/A;    Family History: No family history on file.  Social History  reports that she has never smoked. She has never used smokeless tobacco. She reports that she does not drink alcohol  and does not use drugs.  Allergies  Allergen Reactions   Latex Rash   Penicillins Other (See Comments)    -Pt stated that this medication does her no good    Medications   Current Facility-Administered Medications:    acetaminophen  (TYLENOL ) tablet 650 mg, 650 mg, Oral, Q6H PRN, 650 mg at 01/08/24 0222 **OR** acetaminophen  (TYLENOL ) suppository 650 mg, 650 mg, Rectal, Q6H PRN, Pahwani, Ravi, MD   enoxaparin  (LOVENOX ) injection 40 mg, 40 mg, Subcutaneous, Q24H, Pahwani, Ravi, MD, 40 mg at  01/07/24 1730   melatonin tablet 3 mg, 3 mg, Oral, QHS, Feliz Ortiz, Abraham, MD   metoprolol  succinate (TOPROL -XL) 24 hr tablet 25 mg, 25 mg, Oral, BID, Pahwani, Ravi, MD, 25 mg at 01/08/24 0850   ondansetron  (ZOFRAN ) tablet 4 mg, 4 mg, Oral, Q6H PRN **OR** ondansetron  (ZOFRAN ) injection 4 mg, 4 mg, Intravenous, Q6H PRN, Modena Andes, MD   oxyCODONE  (Oxy IR/ROXICODONE ) immediate release tablet 5 mg, 5 mg, Oral, Q6H PRN, Modena Andes, MD, 5 mg at 01/07/24  1730   sertraline  (ZOLOFT ) tablet 25 mg, 25 mg, Oral, Daily, Pahwani, Ravi, MD, 25 mg at 01/08/24 0850  Vitals   Vitals:   01/07/24 2004 01/08/24 0208 01/08/24 0448 01/08/24 0740  BP: (!) 142/85 (!) 156/82 (!) 178/88 (!) 160/90  Pulse: (!) 110 (!) 104 (!) 101 (!) 105  Resp: 18 18 18 18   Temp: 99 F (37.2 C) 99.1 F (37.3 C) 97.8 F (36.6 C) 97.7 F (36.5 C)  TempSrc: Oral Oral Oral Oral  SpO2: 92% 94% 92% 94%  Weight:  100.4 kg    Height:        Body mass index is 41.82 kg/m.   Physical Exam   Physical Exam  HEENT:  Ali [place]/AT Lungs: Respirations unlabored Extremities: Warm and well perfused.   Neurological Examination Mental Status: Awake and alert. Oriented x 5. Speech fluent with intact naming and comprehension.  Cranial Nerves: II: Visual fields intact bilaterally. No extinction to DSS.   III,IV, VI: No ptosis. EOMI. No nystagmus.  V: Temp sensation equal bilaterally VII: Smile symmetric VIII: Hearing intact to conversation IX,X: No hoarseness or hypophonia XI: Symmetric XII: Midline tongue extension Motor: Right : Upper extremity   5/5    Left:     Upper extremity   5/5  Lower extremity   5/5     Lower extremity   5/5 No pronator drift Sensory: Temp and light touch intact throughout, bilaterally Deep Tendon Reflexes: 2+ and symmetric throughout Cerebellar: No ataxia with FNF or H-S bilaterally Gait: Deferred  Labs/Imaging/Neurodiagnostic studies   CBC:  Recent Labs  Lab 01/20/2024 1714 01/05/24 0543  WBC 7.3 7.4  HGB 9.7* 9.8*  HCT 30.9* 31.0*  MCV 93.9 95.1  PLT 164 184   Basic Metabolic Panel:  Lab Results  Component Value Date   NA 139 01/05/2024   K 3.9 01/05/2024   CO2 26 01/05/2024   GLUCOSE 94 01/05/2024   BUN 26 (H) 01/05/2024   CREATININE 0.88 01/05/2024   CALCIUM 8.7 (L) 01/05/2024   GFRNONAA >60 01/05/2024   Lipid Panel: No results found for: LDLCALC HgbA1c: No results found for: HGBA1C Urine Drug Screen: No results found for:  LABOPIA, COCAINSCRNUR, LABBENZ, AMPHETMU, THCU, LABBARB  Alcohol  Level     Component Value Date/Time   [facility] <15 12/29/2023 1337   INR  Lab Results  Component Value Date   INR 1.2 12/29/2023     ASSESSMENT  DONNALYN JURAN is an 83 y.o. female presenting with recurrent syncopal-like spells. One of the spells involved some limb stiffening and slight shaking. She was recently diagnosed with atrial fibrillation.  - Neurological exam is nonfocal.  - CT head: No acute intracranial abnormality. Chronic small vessel disease. Right frontal, maxillary and sphenoid sinus mucosal thickening. - EEG (01/03/24): This study is within normal limits. No seizures or epileptiform discharges were seen throughout the recording.  - Impression: The semiology of her spells, when taken together with her newly diagnosed atrial fibrillation and significant cardiac  pauses with cardiac arrest last admission, is most consistent with syncope. An overlapping new onset epilepsy is possible, but felt to be unlikely. Also possible would be a syncopal convulsion based on daughter's description of the stiffening and slight shaking that occurred during her most recent syncopal-like spell at home.    RECOMMENDATIONS  - LTM EEG (ordered) - After LTM EEG is placed, would perform orthostatics 3 times daily to assess for possible triggering of an event.  - Seizure and falls precautions.  ______________________________________________________________________    Hope Ly, Ciaira Natividad, MD [facility]

## 2024-01-09 ENCOUNTER — Inpatient Hospital Stay (HOSPITAL_COMMUNITY)

## 2024-01-09 DIAGNOSIS — R569 Unspecified convulsions: Secondary | ICD-10-CM | POA: Diagnosis not present

## 2024-01-09 DIAGNOSIS — R55 Syncope and collapse: Secondary | ICD-10-CM | POA: Diagnosis not present

## 2024-01-09 DIAGNOSIS — I4891 Unspecified atrial fibrillation: Secondary | ICD-10-CM | POA: Diagnosis not present

## 2024-01-09 LAB — COMPREHENSIVE METABOLIC PANEL WITH GFR
ALT: 18 U/L (ref 0–44)
AST: 34 U/L (ref 15–41)
Albumin: 3.1 g/dL — ABNORMAL LOW (ref 3.5–5.0)
Alkaline Phosphatase: 96 U/L (ref 38–126)
Anion gap: 11 (ref 5–15)
BUN: 14 mg/dL (ref 8–23)
CO2: 28 mmol/L (ref 22–32)
Calcium: 8.5 mg/dL — ABNORMAL LOW (ref 8.9–10.3)
Chloride: 99 mmol/L (ref 98–111)
Creatinine, Ser: 0.8 mg/dL (ref 0.44–1.00)
GFR, Estimated: >60 mL/min (ref >60)
Glucose, Bld: 111 mg/dL — ABNORMAL HIGH (ref 70–99)
Potassium: 3.5 mmol/L (ref 3.5–5.1)
Sodium: 138 mmol/L (ref 135–145)
Total Bilirubin: 1.8 mg/dL — ABNORMAL HIGH (ref 0.0–1.2)
Total Protein: 6.6 g/dL (ref 6.5–8.1)

## 2024-01-09 LAB — CBC
HCT: 32.6 % — ABNORMAL LOW (ref 36.0–46.0)
Hemoglobin: 10.5 g/dL — ABNORMAL LOW (ref 12.0–15.0)
MCH: 29.4 pg (ref 26.0–34.0)
MCHC: 32.2 g/dL (ref 30.0–36.0)
MCV: 91.3 fL (ref 80.0–100.0)
Platelets: 184 10*3/uL (ref 150–400)
RBC: 3.57 MIL/uL — ABNORMAL LOW (ref 3.87–5.11)
RDW: 14.7 % (ref 11.5–15.5)
WBC: 4.8 10*3/uL (ref 4.0–10.5)
nRBC: 0 % (ref 0.0–0.2)

## 2024-01-09 MED ORDER — APIXABAN 5 MG PO TABS
5.0000 mg | ORAL_TABLET | Freq: Two times a day (BID) | ORAL | Status: DC
  Administered 2024-01-09 – 2024-01-10 (×3): 5 mg via ORAL
  Filled 2024-01-09 (×3): qty 1

## 2024-01-09 NOTE — Procedures (Signed)
 Patient Name: Barbara Soto  MRN: [id number]  Epilepsy Attending: Arleene Lack  Referring Physician/Provider: Kimberley Penman, MD  Duration: 01/08/2024 1403 to 01/09/2024 1403  Patient history: 83 y.o. female presenting with recurrent syncopal-like spells. One of the spells involved some limb stiffening and slight shaking. EEG to evaluate for seizure.  Level of alertness: Awake, asleep  AEDs during EEG study: None  Technical aspects: This EEG study was done with scalp electrodes positioned according to the 10-20 International system of electrode placement. Electrical activity was reviewed with band pass filter of 1-70Hz , sensitivity of 7 uV/mm, display speed of 30mm/sec with a 60Hz  notched filter applied as appropriate. EEG data were recorded continuously and digitally stored.  Video monitoring was available and reviewed as appropriate.  Description: The posterior dominant rhythm consists of 8 Hz activity of moderate voltage (25-35 uV) seen predominantly in posterior head regions, symmetric and reactive to eye opening and eye closing. Sleep was characterized by vertex waves, sleep spindles (12 to 14 Hz), maximal frontocentral region. Hyperventilation and photic stimulation were not performed.     IMPRESSION: This study is within normal limits. No seizures or epileptiform discharges were seen throughout the recording.  A normal interictal EEG does not exclude the diagnosis of epilepsy.  Keiko Myricks O Charnise Lovan

## 2024-01-09 NOTE — Plan of Care (Signed)

## 2024-01-09 NOTE — Progress Notes (Signed)
 [facility] PROGRESS NOTE    Progress Note  Barbara Soto  WUJ:[id number] DOB: 09/09/1940 DOA: 01/04/2024 PCP: Windell Hasty, DO     Brief Narrative:   Barbara Soto is an 83 y.o. female past medical history significant for essential hypertension, recently discharged from the hospital for syncopal episode after he was found at the bottom of 4 steps facedown, when he arrived in the ED he was found to be in A-fib with RVR, he arrested while in the hospital leading to CPR 12/30/2023, she had a complete workup for syncope except for an MRI, CT scan and EEG were unremarkable.  EP consultation with a pacemaker implantation on 01/01/2024, despite pacemaker he was still having syncopal episodes while being hospitalized, then eventually became asymptomatic and discharge, was told to resume Eliquis  on 01/07/2024.  When he arrived home he passed out as per daughter he lost consciousness for about 2 minutes there was no loss of control of sphincters no biting of the tongue.  Assessment/Plan:   Recurrent syncope: EP relates no cardiac causes. Cardiology said is okay to perform MRI Neurology consulted recommended long-term EEG and orthostatics 3 times. Out of bed to chair with caution.  Chronic kidney disease stage IIIa: Her creatinine appears to be at baseline.  Atrial fibrillation, chronic (HCC) Rate control less than 100 continue beta-blocker. Eliquis  currently on hold. EP and cardiology to dictate when to start anticoagulation and if she is a candidate.  Recent left ankle injury: Follows up with orthopedic as an outpatient weightbearing as tolerated.  Morbid obesity: She has been counseled.   DVT prophylaxis: lovenox  Family Communication:daughter Status is: Inpatient Remains inpatient appropriate because: Recurrent syncope    Code Status:     Code Status Orders  (From admission, onward)           Start     Ordered   01/04/24 1704  Full code  Continuous       Question:   By:  Answer:  Consent: discussion documented in EHR   01/04/24 1703           Code Status History     Date Active Date Inactive Code Status Order ID Comments User Context   01/04/2024 1520 01/04/2024 1704 Full Code [id number]  Ninetta Basket, MD ED   12/30/2023 2032 01/04/2024 1505 Full Code [id number]  Roxana Copier, DO Inpatient   12/30/2023 2020 12/30/2023 2032 Full Code [id number]  Katrine Parody, MD Inpatient   12/29/2023 1750 12/30/2023 2020 Limited: Do not attempt resuscitation (DNR) -DNR-LIMITED -Do Not Intubate/DNI  [id number]  Lesa Rape, MD ED   12/29/2023 1750 12/29/2023 1750 Do not attempt resuscitation (DNR) PRE-ARREST INTERVENTIONS DESIRED [id number]  Lesa Rape, MD ED   12/29/2023 1742 12/29/2023 1750 Full Code [id number]  Lesa Rape, MD ED         IV Access:   Peripheral IV   Procedures and diagnostic studies:   No results found.    Medical Consultants:   None.   Subjective:    Etter Hermann no complaints.  Objective:    Vitals:   01/08/24 1648 01/08/24 2139 01/09/24 0046 01/09/24 0455  BP: (!) 158/100 (!) 154/89 131/85 (!) 158/86  Pulse: 89 96 97 (!) 103  Resp: 20 16 19 20   Temp: 98.6 F (37 C) 97.8 F (36.6 C) (!) 97.2 F (36.2 C) 98.1 F (36.7 C)  TempSrc: Oral Oral Axillary Oral  SpO2: 94% 93% 92% 94%  Weight:  Height:       SpO2: 94 % O2 Flow Rate (L/min): 1 L/min   Intake/Output Summary (Last 24 hours) at 01/09/2024 0641 Last data filed at 01/08/2024 1940 Gross per 24 hour  Intake 580 ml  Output --  Net 580 ml   Filed Weights   01/04/24 1516 01/07/24 0355 01/08/24 0208  Weight: 95.3 kg 103.7 kg 100.4 kg    Exam: General exam: In no acute distress. Respiratory system: Good air movement and clear to auscultation. Cardiovascular system: S1 & S2 heard, RRR. No JVD. Gastrointestinal system: Abdomen is nondistended, soft and nontender.  Extremities: No pedal edema. Skin: No rashes, lesions or ulcers Psychiatry:  Judgement and insight appear normal. Mood & affect appropriate. Data Reviewed:    Labs: Basic Metabolic Panel: Recent Labs  Lab 01/03/24 0400 01/04/24 0421 01/04/24 1515 01/04/24 1714 01/05/24 0543  NA 137 138 137  --  139  K 3.5 3.6 3.6  --  3.9  CL 101 103 101  --  104  CO2 26 26 24   --  26  GLUCOSE 106* 120* 146*  --  94  BUN 17 20 24*  --  26*  CREATININE 0.80 1.01* 0.97 0.94 0.88  CALCIUM 8.6* 8.7* 8.6*  --  8.7*  MG  --  1.8 1.9  --   --   PHOS  --  2.9  --   --   --    GFR Estimated Creatinine Clearance: 54.5 mL/min (by C-G formula based on SCr of 0.88 mg/dL). Liver Function Tests: Recent Labs  Lab 01/04/24 0421 01/05/24 0543  AST 24 20  ALT 13 13  ALKPHOS 66 66  BILITOT 1.7* 1.5*  PROT 6.5 6.1*  ALBUMIN  3.2* 3.2*   No results for input(s): LIPASE, AMYLASE in the last 168 hours. No results for input(s): AMMONIA in the last 168 hours. Coagulation profile No results for input(s): INR, PROTIME in the last 168 hours.  COVID-19 Labs  No results for input(s): DDIMER, FERRITIN, LDH, CRP in the last 72 hours.  No results found for: SARSCOV2NAA  CBC: Recent Labs  Lab 01/03/24 0400 01/04/24 0421 01/04/24 1515 01/04/24 1714 01/05/24 0543  WBC 7.4 8.2 8.1 7.3 7.4  HGB 9.9* 10.0* 10.4* 9.7* 9.8*  HCT 30.3* 31.2* 33.5* 30.9* 31.0*  MCV 92.1 92.6 93.1 93.9 95.1  PLT 155 190 188 164 184   Cardiac Enzymes: No results for input(s): CKTOTAL, CKMB, CKMBINDEX, TROPONINI in the last 168 hours. BNP (last 3 results) No results for input(s): PROBNP in the last 8760 hours. CBG: No results for input(s): GLUCAP in the last 168 hours.  D-Dimer: No results for input(s): DDIMER in the last 72 hours. Hgb A1c: No results for input(s): HGBA1C in the last 72 hours. Lipid Profile: No results for input(s): CHOL, HDL, LDLCALC, TRIG, CHOLHDL, LDLDIRECT in the last 72 hours. Thyroid  function studies: No results for input(s):  TSH, T4TOTAL, T3FREE, THYROIDAB in the last 72 hours.  Invalid input(s): FREET3 Anemia work up: No results for input(s): VITAMINB12, FOLATE, FERRITIN, TIBC, IRON, RETICCTPCT in the last 72 hours. Sepsis Labs: Recent Labs  Lab 01/04/24 0421 01/04/24 1515 01/04/24 1714 01/05/24 0543  WBC 8.2 8.1 7.3 7.4   Microbiology Recent Results (from the past 240 hours)  Surgical PCR screen     Status: None   Collection Time: 12/31/23  8:40 AM   Specimen: Nasal Mucosa; Nasal Swab  Result Value Ref Range Status   MRSA, PCR NEGATIVE NEGATIVE Final  Staphylococcus aureus NEGATIVE NEGATIVE Final    Comment: (NOTE) The Xpert SA Assay (FDA approved for NASAL specimens in patients 44 years of age and older), is one component of a comprehensive surveillance program. It is not intended to diagnose infection nor to guide or monitor treatment. Performed at [facility], [address], [place], [place] [address]      Medications:    enoxaparin  (LOVENOX ) injection  40 mg Subcutaneous Q24H   melatonin  3 mg Oral QHS   metoprolol  succinate  25 mg Oral BID   sertraline   25 mg Oral Daily   Continuous Infusions:    LOS: 5 days   Macdonald Savoy  [facility]  01/09/2024, 6:41 AM

## 2024-01-09 NOTE — Progress Notes (Signed)
EEG maint complete.  ?

## 2024-01-09 NOTE — Plan of Care (Signed)
  Problem: Clinical Measurements: Goal: Respiratory complications will improve Outcome: Progressing   Problem: Activity: Goal: Risk for activity intolerance will decrease Outcome: Progressing   Problem: Nutrition: Goal: Adequate nutrition will be maintained Outcome: Progressing   

## 2024-01-09 NOTE — Progress Notes (Addendum)
  Progress Note  Patient Name: Barbara Soto Date of Encounter: 01/09/2024 [facility] Cardiologist: Euell Herrlich, MD   Interval Summary   Resting in bed, no complaints  Orthostatics unimpressive   Vital Signs Vitals:   01/08/24 2139 01/09/24 0046 01/09/24 0455 01/09/24 0800  BP: (!) 154/89 131/85 (!) 158/86 (!) 150/102  Pulse: 96 97 (!) 103 98  Resp: 16 19 20 17   Temp: 97.8 F (36.6 C) (!) 97.2 F (36.2 C) 98.1 F (36.7 C)   TempSrc: Oral Axillary Oral   SpO2: 93% 92% 94% 92%  Weight:      Height:        Intake/Output Summary (Last 24 hours) at 01/09/2024 0931 Last data filed at 01/09/2024 0900 Gross per 24 hour  Intake 580 ml  Output --  Net 580 ml      01/08/2024    2:08 AM 01/07/2024    3:55 AM 01/04/2024    3:16 PM  Last 3 Weights  Weight (lbs) 221 lb 5.5 oz 228 lb 9.9 oz 210 lb  Weight (kg) 100.4 kg 103.7 kg 95.255 kg      Telemetry/ECG  Atrial fibrillation, HR 90s - Personally Reviewed  Physical Exam  GEN: No acute distress.   Neck: No JVD Cardiac: RRR, no murmurs, rubs, or gallops.  Respiratory: Clear to auscultation bilaterally. 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 Fib  Neurology consulted, recommend long-term EEG with orthostatics TID Initial orthostatics unimpressive Follow up appointment made with general cardiology 6/19  Atrial fibrillation Device interrogation showed persistent A. Fib  HR 90s Continue Toprol  25 mg BID  EP had suggested starting Eliquis  on 6/8 -- will recheck CBC today, if stable will restart Eliquis  with low threshold to stop if patient continues to have recurrent syncope/falls   Per  primary CKD stage 3a Left ankle injury    For questions or updates, please contact [facility] Please consult [email or web address] for contact info under       Signed, Jiles Mote, PA-C   Patient seen and examined with TP PA-C.  Agree as above, with the following exceptions and changes as noted below. Overall unremarkable orthostatics but will provide compression TED hose for likely vasodepressor component of syncope. Gen: NAD, CV: RRR, no murmurs, Lungs: clear, Abd: soft, Extrem: Warm, well perfused, no edema, Neuro/Psych: alert and oriented x 3, normal mood and affect. All available labs, radiology testing, previous records reviewed. Reasonable to resume eliquis  today if CBC stable. Given syncope with concern for orthostasis or vasodepressor component, would aim for lenient BP control at least until follow up.  No other inpatient cardiology recommendations at this time, can f/u in our office with wound check 6/17 and OV 6/19.   Gerda Yin A Dominick Morella, MD 01/09/24 10:27 AM

## 2024-01-10 ENCOUNTER — Inpatient Hospital Stay (HOSPITAL_COMMUNITY)

## 2024-01-10 ENCOUNTER — Other Ambulatory Visit (HOSPITAL_COMMUNITY): Payer: Self-pay

## 2024-01-10 DIAGNOSIS — R55 Syncope and collapse: Secondary | ICD-10-CM | POA: Diagnosis not present

## 2024-01-10 DIAGNOSIS — I4891 Unspecified atrial fibrillation: Secondary | ICD-10-CM | POA: Diagnosis not present

## 2024-01-10 DIAGNOSIS — R569 Unspecified convulsions: Secondary | ICD-10-CM | POA: Diagnosis not present

## 2024-01-10 LAB — COMPREHENSIVE METABOLIC PANEL WITH GFR
ALT: 20 U/L (ref 0–44)
AST: 36 U/L (ref 15–41)
Albumin: 3.2 g/dL — ABNORMAL LOW (ref 3.5–5.0)
Alkaline Phosphatase: 98 U/L (ref 38–126)
Anion gap: 10 (ref 5–15)
BUN: 10 mg/dL (ref 8–23)
CO2: 31 mmol/L (ref 22–32)
Calcium: 8.4 mg/dL — ABNORMAL LOW (ref 8.9–10.3)
Chloride: 97 mmol/L — ABNORMAL LOW (ref 98–111)
Creatinine, Ser: 0.82 mg/dL (ref 0.44–1.00)
GFR, Estimated: >60 mL/min (ref >60)
Glucose, Bld: 108 mg/dL — ABNORMAL HIGH (ref 70–99)
Potassium: 3.5 mmol/L (ref 3.5–5.1)
Sodium: 138 mmol/L (ref 135–145)
Total Bilirubin: 1.7 mg/dL — ABNORMAL HIGH (ref 0.0–1.2)
Total Protein: 6.5 g/dL (ref 6.5–8.1)

## 2024-01-10 LAB — CBC
HCT: 32.1 % — ABNORMAL LOW (ref 36.0–46.0)
Hemoglobin: 10.4 g/dL — ABNORMAL LOW (ref 12.0–15.0)
MCH: 29.5 pg (ref 26.0–34.0)
MCHC: 32.4 g/dL (ref 30.0–36.0)
MCV: 91.2 fL (ref 80.0–100.0)
Platelets: 195 10*3/uL (ref 150–400)
RBC: 3.52 MIL/uL — ABNORMAL LOW (ref 3.87–5.11)
RDW: 14.7 % (ref 11.5–15.5)
WBC: 5.1 10*3/uL (ref 4.0–10.5)
nRBC: 0 % (ref 0.0–0.2)

## 2024-01-10 MED ORDER — HYDRALAZINE HCL 25 MG PO TABS
25.0000 mg | ORAL_TABLET | Freq: Two times a day (BID) | ORAL | 1 refills | Status: AC | PRN
  Filled 2024-01-10: qty 120, 60d supply, fill #0

## 2024-01-10 NOTE — Progress Notes (Signed)
 vLTM maintenance  All impedances below 10kohms  No skin bneakdown noted at  FP1  FP1  A1  A2

## 2024-01-10 NOTE — Progress Notes (Signed)
 PT Cancellation Note  Patient Details Name: ASTARIA NANEZ MRN: [id number] DOB: May 05, 1941   Cancelled Treatment:    Reason Eval/Treat Not Completed: Patient at procedure or test/unavailable (with OT)   Baldomero Mirarchi B Keelie Zemanek 01/10/2024, 11:45 AM Annis Baseman, PT Acute Rehabilitation Services Office: [phone]

## 2024-01-10 NOTE — Plan of Care (Signed)

## 2024-01-10 NOTE — Progress Notes (Signed)
 NEUROLOGY CONSULT FOLLOW UP NOTE   Date of service: January 10, 2024 Patient Name: Barbara Soto MRN:  [id number] DOB:  Jun 18, 1941  Interval Hx/subjective   No further spells, plan to discontinue LTM today. MRI brain when able - per EP she is okay to have an MRI.  Vitals   Vitals:   01/10/24 0451 01/10/24 0539 01/10/24 0710 01/10/24 0800  BP: (!) 171/101 (!) 168/88 (!) 160/97   Pulse: 91  98   Resp: 20  19 (!) 22  Temp: 97.8 F (36.6 C)  98.1 F (36.7 C)   TempSrc: Oral  Oral   SpO2: 97%  95%   Weight: 100 kg     Height:         Body mass index is 41.66 kg/m.  Physical Exam   Physical Exam  HEENT:  [place]/AT Lungs: Respirations unlabored Extremities: Warm and well perfused.    Neurological Examination Mental Status: Awake and alert. Oriented x 5. Speech fluent with intact naming and comprehension.  Cranial Nerves: II: Visual fields intact bilaterally. No extinction to DSS.   III,IV, VI: No ptosis. EOMI. No nystagmus.  V: Temp sensation equal bilaterally VII: Smile symmetric VIII: Hearing intact to conversation IX,X: No hoarseness or hypophonia XI: Symmetric XII: Midline tongue extension Motor: Right :  Upper extremity   5/5                                      Left:     Upper extremity   5/5             Lower extremity   5/5                                                  Lower extremity   5/5 No pronator drift Sensory: Temp and light touch intact throughout, bilaterally Deep Tendon Reflexes: 2+ and symmetric throughout Cerebellar: No ataxia with FNF or H-S bilaterally Gait: Deferred  Medications  Current Facility-Administered Medications:    acetaminophen  (TYLENOL ) tablet 650 mg, 650 mg, Oral, Q6H PRN, 650 mg at 01/08/24 2001 **OR** acetaminophen  (TYLENOL ) suppository 650 mg, 650 mg, Rectal, Q6H PRN, Pahwani, Ravi, MD   apixaban  (ELIQUIS ) tablet 5 mg, 5 mg, Oral, BID, Parcells, Taylor A, PA-C, 5 mg at 01/09/24 2110   melatonin tablet 3 mg, 3 mg, Oral, QHS,  Macdonald Savoy, MD, 3 mg at 01/09/24 2244   metoprolol  succinate (TOPROL -XL) 24 hr tablet 25 mg, 25 mg, Oral, BID, Pahwani, Ravi, MD, 25 mg at 01/09/24 2111   ondansetron  (ZOFRAN ) tablet 4 mg, 4 mg, Oral, Q6H PRN **OR** ondansetron  (ZOFRAN ) injection 4 mg, 4 mg, Intravenous, Q6H PRN, Pahwani, Ravi, MD   oxyCODONE  (Oxy IR/ROXICODONE ) immediate release tablet 5 mg, 5 mg, Oral, Q6H PRN, Pahwani, Ravi, MD, 5 mg at 01/10/24 0805   sertraline  (ZOLOFT ) tablet 25 mg, 25 mg, Oral, Daily, Pahwani, Ravi, MD, 25 mg at 01/09/24 1005  Labs and Diagnostic Imaging   CBC:  Recent Labs  Lab 01/05/24 0543 01/09/24 1051  WBC 7.4 4.8  HGB 9.8* 10.5*  HCT 31.0* 32.6*  MCV 95.1 91.3  PLT 184 184    Basic Metabolic Panel:  Lab Results  Component Value Date   NA 138 01/09/2024  K 3.5 01/09/2024   CO2 28 01/09/2024   GLUCOSE 111 (H) 01/09/2024   BUN 14 01/09/2024   CREATININE 0.80 01/09/2024   CALCIUM 8.5 (L) 01/09/2024   GFRNONAA >60 01/09/2024   Lipid Panel: No results found for: LDLCALC HgbA1c: No results found for: HGBA1C Urine Drug Screen: No results found for: LABOPIA, COCAINSCRNUR, LABBENZ, AMPHETMU, THCU, LABBARB  Alcohol  Level     Component Value Date/Time   [facility] <15 12/29/2023 1337   INR  Lab Results  Component Value Date   INR 1.2 12/29/2023   CT Head without contrast (Personally reviewed): 1. No acute intracranial abnormality. 2. Chronic small vessel disease. 3. Right frontal, maxillary and sphenoid sinus mucosal thickening.  cEEG: 01/08/2024 1403 to 01/09/2024 1403  This study is within normal limits. No seizures or epileptiform discharges were seen throughout the recording.  Orthostatic VS for the past 24 hrs:  BP- Lying Pulse- Lying BP- Sitting Pulse- Sitting BP- Standing at 0 minutes Pulse- Standing at 0 minutes  01/10/24 0800 (!) 159/97 102 (!) 147/96 98 145/89 99  01/10/24 0104 (!) 162/99 118 149/82 102 134/63 82  01/09/24 1600 149/89 94 (!) 149/91  101 143/77 102      Assessment   Barbara Soto is an 83 y.o. female presenting with recurrent syncopal-like spells. One of the spells involved some limb stiffening and slight shaking. She was recently diagnosed with atrial fibrillation.  - Neurological exam is nonfocal.  - CT head: No acute intracranial abnormality. Chronic small vessel disease. Right frontal, maxillary and sphenoid sinus mucosal thickening. - cEEG (01/03/24): This study is within normal limits. No seizures or epileptiform discharges were seen throughout the recording.  - Impression: The semiology of her spells, when taken together with her newly diagnosed atrial fibrillation and significant cardiac pauses with cardiac arrest last admission, is most consistent with syncope. An overlapping new onset epilepsy is possible, but felt to be unlikely. Also possible would be a syncopal convulsion based on daughter's description of the stiffening and slight shaking that occurred during her most recent syncopal-like spell at home.  Patient does not remember episode herself, she states that she has had other falls but she typically knows what causes them (tripping, missing a step, etc). Denies any events overnight.     Recommendations  - MRI brain (per EP okay to proceed with MRI)- if unable to obtain then recommend outpatient MRI in 6 weeks.  - Discontinue LTM ______________________________________________________________________   Arletha Bend, NP [facility]

## 2024-01-10 NOTE — Evaluation (Signed)
 Occupational Therapy Evaluation Patient Details Name: Barbara Soto MRN: [id number] DOB: 1940-08-08 Today's Date: 01/10/2024   History of Present Illness   83 yo female admitted 01/04/24 with syncope after having just discharged same date. Admission 5/30 after fall found at bottom of stairs, coded Jan 24, 2024, Jan 26, 2024 PPM, 6/3 syncope.  PMH: recent L ankle injury from a fall, depression, HTN, Afib     Clinical Impressions Pt admitted for above, PTA pt reports being active in home with gardening and being ind with ADLs, using 3 wheel walker at home. Pt currently presenting with decreased activity tolerance, BP stable during session follwing orthostatic testing but following 3 mins prolonged standing her BP was noted to begin dipping although she was asymptomatic. Pt demonstrate ability to complete ADLs with mod A to Setup, uses AE to baseline to counteract deficits in ADL performance, ambulating room with supervision + RW. Orthostatic BPs  Supine 130/75  Sitting 136/82  Standing 132/76(93)  Standing after 4 min 104/58(67)    OT to continue following pt while in acute setting to progress pt as able. Patient would benefit from post acute Home OT services to help maximize functional independence in natural environment     If plan is discharge home, recommend the following:   A little help with walking and/or transfers;A little help with bathing/dressing/bathroom;Assistance with cooking/housework;Assist for transportation     Functional Status Assessment   Patient has had a recent decline in their functional status and demonstrates the ability to make significant improvements in function in a reasonable and predictable amount of time.     Equipment Recommendations   None recommended by OT     Recommendations for Other Services         Precautions/Restrictions   Precautions Precautions: Fall;ICD/Pacemaker Precaution/Restrictions Comments: PPM 01/26/24 Restrictions Weight Bearing  Restrictions Per Provider Order: No LUE Weight Bearing Per Provider Order:  (no push/pull/lift) Other Position/Activity Restrictions: PPM     Mobility Bed Mobility Overal bed mobility: Modified Independent             General bed mobility comments: inc time.    Transfers Overall transfer level: Needs assistance Equipment used: Rolling walker (2 wheels) Transfers: Sit to/from Stand, Bed to chair/wheelchair/BSC Sit to Stand: Supervision Stand pivot transfers: Supervision                Balance Overall balance assessment: Needs assistance Sitting-balance support: Feet supported, No upper extremity supported Sitting balance-Leahy Scale: Good                                     ADL either performed or assessed with clinical judgement   ADL Overall ADL's : Needs assistance/impaired Eating/Feeding: Sitting;Independent   Grooming: Standing;Supervision/safety   Upper Body Bathing: Sitting;Set up   Lower Body Bathing: Sitting/lateral leans;Minimal assistance Lower Body Bathing Details (indicate cue type and reason): uses AE at baseline. Upper Body Dressing : Sitting;Set up Upper Body Dressing Details (indicate cue type and reason): don gown like jacket   Lower Body Dressing Details (indicate cue type and reason): Mod A, pt uses AE at baseline Toilet Transfer: Rolling walker (2 wheels);Supervision/safety   Toileting- Architect and Hygiene: Sit to/from stand;Supervision/safety       Functional mobility during ADLs: Supervision/safety;Rolling walker (2 wheels)       Vision         Perception         Praxis  Pertinent Vitals/Pain Pain Assessment Pain Assessment: No/denies pain     Extremity/Trunk Assessment Upper Extremity Assessment Upper Extremity Assessment: LUE deficits/detail LUE Deficits / Details: Pt ROM WFL, PPM precautions deferred MMT.   Lower Extremity Assessment Lower Extremity Assessment: Generalized  weakness   Cervical / Trunk Assessment Cervical / Trunk Assessment: Kyphotic   Communication Communication Communication: No apparent difficulties   Cognition Arousal: Alert Behavior During Therapy: WFL for tasks assessed/performed                                 Following commands: Intact       Cueing  General Comments   Cueing Techniques: Verbal cues;Tactile cues;Visual cues  pt continues to have a drop in BP when standing compared to sitting. pressure in standing is 104/58 this session and BP in sitting is 144/74. Pt denies symptoms of pre-syncope throughout session   Exercises     Shoulder Instructions      Home Living Family/patient expects to be discharged to:: Private residence Living Arrangements: Children Available Help at Discharge: Available PRN/intermittently;Family;Friend(s) Type of Home: House Home Access: Stairs to enter [employer] of Steps: 1+1+1 Entrance Stairs-Rails: None Home Layout: One level     Bathroom Shower/Tub: Chief Strategy Officer: Standard Bathroom Accessibility: No   Home Equipment: Shower seat;BSC/3in1;Adaptive equipment;Cane - quad;Lift chair;Rolling Walker (2 wheels) Adaptive Equipment: Reacher;Sock aid        Prior Functioning/Environment Prior Level of Function : Independent/Modified Independent;Driving;Working/employed             Mobility Comments: Using 3 wheel walker at home; driving ADLs Comments: work 4 nights/week as an Engineer, production for a lady with dementia. Ind; gardening    OT Problem List: Decreased activity tolerance   OT Treatment/Interventions: Self-care/ADL training;Therapeutic exercise;Energy conservation;DME and/or AE instruction;Therapeutic activities;Balance training;Patient/family education      OT Goals(Current goals can be found in the care plan section)   Acute Rehab OT Goals Patient Stated Goal: to go home OT Goal Formulation: With patient Time For Goal  Achievement: 01/24/24 Potential to Achieve Goals: Good ADL Goals Pt Will Perform Grooming: with modified independence;standing Pt Will Perform Lower Body Dressing: with modified independence;sit to/from stand Pt Will Transfer to Toilet: with modified independence;ambulating Additional ADL Goal #2: Pt will demosntrate independent use of energy conservation strategies to engage in household ADLs   OT Frequency:  Min 2X/week    Co-evaluation              AM-PAC OT 6 Clicks Daily Activity     Outcome Measure Help from another person eating meals?: None Help from another person taking care of personal grooming?: A Little Help from another person toileting, which includes using toliet, bedpan, or urinal?: A Little Help from another person bathing (including washing, rinsing, drying)?: A Little Help from another person to put on and taking off regular upper body clothing?: A Little Help from another person to put on and taking off regular lower body clothing?: A Lot 6 Click Score: 18   End of Session Equipment Utilized During Treatment: Gait belt;Rolling walker (2 wheels) Nurse Communication: Mobility status  Activity Tolerance: Patient tolerated treatment well Patient left: in chair;with call bell/phone within reach  OT Visit Diagnosis: Other (comment) (syncope)                Time: 1610-9604 OT Time Calculation (min): 27 min Charges:  OT General Charges $OT Visit: 1  Visit OT Evaluation $OT Eval Low Complexity: 1 Low OT Treatments $Therapeutic Activity: 8-22 mins  01/10/2024  AB, OTR/L  Acute Rehabilitation Services  Office: [phone]   Jorene New 01/10/2024, 3:03 PM

## 2024-01-10 NOTE — TOC Transition Note (Signed)
 Transition of Care [facility]) - Discharge Note   Patient Details  Name: Barbara Soto MRN: [id number] Date of Birth: Mar 28, 1941  [facility] CM/SW Contact:  Jennett Model, RN Phone Number: 01/10/2024, 2:26 PM   Clinical Narrative:    For dc today, NCM notified Amy with [facility].  Patient daughter will transport her home.         Patient Goals and CMS Choice            Discharge Placement                       Discharge Plan and Services Additional resources added to the After Visit Summary for                                       Social Drivers of Health (SDOH) Interventions SDOH Screenings   Food Insecurity: No Food Insecurity (01/05/2024)  Housing: High Risk (01/05/2024)  Transportation Needs: No Transportation Needs (01/05/2024)  Utilities: Not At Risk (01/05/2024)  Social Connections: Unknown (01/05/2024)  Tobacco Use: Low Risk  (01/04/2024)     Readmission Risk Interventions     No data to display

## 2024-01-10 NOTE — Procedures (Addendum)
 Patient Name: Barbara Soto  MRN: [id number]  Epilepsy Attending: Arleene Lack  Referring Physician/Provider: Kimberley Penman, MD  Duration: 01/09/2024 1403 to 01/10/2024 1017   Patient history: 83 y.o. female presenting with recurrent syncopal-like spells. One of the spells involved some limb stiffening and slight shaking. EEG to evaluate for seizure.   Level of alertness: Awake, asleep   AEDs during EEG study: None   Technical aspects: This EEG study was done with scalp electrodes positioned according to the 10-20 International system of electrode placement. Electrical activity was reviewed with band pass filter of 1-70Hz , sensitivity of 7 uV/mm, display speed of 72mm/sec with a 60Hz  notched filter applied as appropriate. EEG data were recorded continuously and digitally stored.  Video monitoring was available and reviewed as appropriate.   Description: The posterior dominant rhythm consists of 8 Hz activity of moderate voltage (25-35 uV) seen predominantly in posterior head regions, symmetric and reactive to eye opening and eye closing. Sleep was characterized by vertex waves, sleep spindles (12 to 14 Hz), maximal frontocentral region. Hyperventilation and photic stimulation were not performed.      IMPRESSION: This study is within normal limits. No seizures or epileptiform discharges were seen throughout the recording.   A normal interictal EEG does not exclude the diagnosis of epilepsy.   Barbara Soto

## 2024-01-10 NOTE — Progress Notes (Signed)
 LTM VIDEO EEG discontinued - no skin breakdown at [facility]

## 2024-01-10 NOTE — Discharge Summary (Signed)
 Physician Discharge Summary   Patient: Barbara Soto MRN: [id number] DOB: [birth year]  Admit date:     01/04/2024  Discharge date: 01/10/24  Discharge Physician: Ezzard Holms   PCP: Windell Hasty, DO   Recommendations at discharge:  Follow-up with cardiology  Discharge Diagnoses:  Recurrent syncope Chronic kidney disease stage IIIa: Atrial fibrillation, chronic (HCC) Recent left ankle injury: Morbid obesity:  Hospital Course: Barbara Soto is an 83 y.o. female past medical history significant for essential hypertension, recently discharged from the hospital for syncopal episode after he was found at the bottom of 4 steps facedown, when he arrived in the ED he was found to be in A-fib with RVR, he arrested while in the hospital leading to CPR 12/30/2023, she had a complete workup for syncope except for an MRI, CT scan and EEG were unremarkable.  EP consultation with a pacemaker implantation on 01/01/2024, despite pacemaker he was still having syncopal episodes while being hospitalized, then eventually became asymptomatic and discharge, was told to resume Eliquis  on 01/07/2024.  When he arrived home he passed out as per daughter he lost consciousness for about 2 minutes there was no loss of control of sphincters no biting of the tongue.  Patient was seen back at urology as well as neurology.  Underwent EEG that was normal.  Cardiology recommends follow-up as an outpatient with close monitoring of patient's blood pressure.  Consultants: Cardiology, neurology Procedures performed: EEG Disposition: Home Diet recommendation:  Cardiac diet DISCHARGE MEDICATION: Allergies as of 01/10/2024       Reactions   Latex Rash   Penicillins Other (See Comments)   -Pt stated that this medication does her no good        Medication List     TAKE these medications    Eliquis  5 MG Tabs tablet Generic drug: apixaban  Take 1 tablet (5 mg total) by mouth 2 (two) times daily.   hydrALAZINE  25 MG  tablet Commonly known as: APRESOLINE  Take 1 tablet (25 mg total) by mouth 2 (two) times daily as needed (SBP>170).   lidocaine  5 % Commonly known as: LIDODERM  Place 1 patch onto the skin daily. Remove & Discard patch within 12 hours or as directed by MD   metoprolol  succinate 25 MG 24 hr tablet Commonly known as: TOPROL -XL Take 1 tablet (25 mg total) by mouth 2 (two) times daily.   oxyCODONE  5 MG immediate release tablet Commonly known as: Oxy IR/ROXICODONE  Take 1 tablet (5 mg total) by mouth every 6 (six) hours as needed for severe pain (pain score 7-10).   sertraline  25 MG tablet Commonly known as: ZOLOFT  Take 25 mg by mouth daily.        Discharge Exam: Filed Weights   01/07/24 0355 01/08/24 0208 01/10/24 0451  Weight: 103.7 kg 100.4 kg 100 kg   General exam: In no acute distress. Respiratory system: Good air movement and clear to auscultation. Cardiovascular system: S1 & S2 heard, RRR. No JVD. Gastrointestinal system: Abdomen is nondistended, soft and nontender.  Extremities: No pedal edema. Skin: No rashes, lesions or ulcers Psychiatry: Judgement and insight appear normal. Mood & affect appropriate.  Condition at discharge: good  The results of significant diagnostics from this hospitalization (including imaging, microbiology, ancillary and laboratory) are listed below for reference.   Imaging Studies: Overnight EEG with video Result Date: 01/09/2024 Arleene Lack, MD     01/10/2024  8:40 AM Patient Name: Barbara Soto MRN: [id number] Epilepsy Attending: Arleene Lack Referring Physician/Provider:  Kimberley Penman, MD Duration: 01/08/2024 1403 to 01/09/2024 1403 Patient history: 83 y.o. female presenting with recurrent syncopal-like spells. One of the spells involved some limb stiffening and slight shaking. EEG to evaluate for seizure. Level of alertness: Awake, asleep AEDs during EEG study: None Technical aspects: This EEG study was done with scalp electrodes  positioned according to the 10-20 International system of electrode placement. Electrical activity was reviewed with band pass filter of 1-70Hz , sensitivity of 7 uV/mm, display speed of 44mm/sec with a 60Hz  notched filter applied as appropriate. EEG data were recorded continuously and digitally stored.  Video monitoring was available and reviewed as appropriate. Description: The posterior dominant rhythm consists of 8 Hz activity of moderate voltage (25-35 uV) seen predominantly in posterior head regions, symmetric and reactive to eye opening and eye closing. Sleep was characterized by vertex waves, sleep spindles (12 to 14 Hz), maximal frontocentral region. Hyperventilation and photic stimulation were not performed.   IMPRESSION: This study is within normal limits. No seizures or epileptiform discharges were seen throughout the recording. A normal interictal EEG does not exclude the diagnosis of epilepsy. Arleene Lack   DG Chest Portable 1 View Result Date: 01/04/2024 CLINICAL DATA:  Syncope. EXAM: PORTABLE CHEST 1 VIEW COMPARISON:  January 02, 2024 FINDINGS: There is stable dual lead AICD positioning. The cardiac silhouette is mildly enlarged and unchanged in size. Low lung volumes are noted. Mild to moderate severity atelectasis and/or infiltrate is seen within the left lung base. No pleural effusion or pneumothorax is identified. The visualized skeletal structures are unremarkable. IMPRESSION: Low lung volumes with mild to moderate severity left basilar atelectasis and/or infiltrate. Electronically Signed   By: Virgle Grime M.D.   On: 01/04/2024 16:13   CT HEAD WO CONTRAST ( ) Result Date: 01/03/2024 CLINICAL DATA:  Syncope EXAM: CT HEAD WITHOUT CONTRAST TECHNIQUE: Contiguous axial images were obtained from the base of the skull through the vertex without intravenous contrast. RADIATION DOSE REDUCTION: This exam was performed according to the departmental dose-optimization program which includes  automated exposure control, adjustment of the mA and/or kV according to patient size and/or use of iterative reconstruction technique. COMPARISON:  12/29/2023 FINDINGS: Brain: There is no mass, hemorrhage or extra-axial collection. The size and configuration of the ventricles and extra-axial CSF spaces are normal. There is hypoattenuation of the white matter, most commonly indicating chronic small vessel disease. Vascular: Atherosclerotic calcification of the vertebral and internal carotid arteries at the skull base. No abnormal hyperdensity of the major intracranial arteries or dural venous sinuses. Skull: Negative Sinuses/Orbits: Right frontal, maxillary and sphenoid sinus mucosal thickening Other: None. IMPRESSION: 1. No acute intracranial abnormality. 2. Chronic small vessel disease. 3. Right frontal, maxillary and sphenoid sinus mucosal thickening. Electronically Signed   By: Juanetta Nordmann M.D.   On: 01/03/2024 16:16   EEG adult Result Date: 01/03/2024 Arleene Lack, MD     01/03/2024 11:01 AM Patient Name: GEANINE VANDEKAMP MRN: [id number] Epilepsy Attending: Arleene Lack Referring Physician/Provider: Osborn Blaze, MD Date: 01/03/2024 Duration: 24.09 mins Patient history: 83yo F s/p cardiac arrest. EEG to evaluate for seizure Level of alertness: Awake AEDs during EEG study: None Technical aspects: This EEG study was done with scalp electrodes positioned according to the 10-20 International system of electrode placement. Electrical activity was reviewed with band pass filter of 1-70Hz , sensitivity of 7 uV/mm, display speed of 72mm/sec with a 60Hz  notched filter applied as appropriate. EEG data were recorded continuously and digitally stored.  Video monitoring  was available and reviewed as appropriate. Description: The posterior dominant rhythm consists of 8 Hz activity of moderate voltage (25-35 uV) seen predominantly in posterior head regions, symmetric and reactive to eye opening and eye closing.  Hyperventilation and photic stimulation were not performed.   IMPRESSION: This study is within normal limits. No seizures or epileptiform discharges were seen throughout the recording. A normal interictal EEG does not exclude the diagnosis of epilepsy. Arleene Lack   DG Chest 2 View Result Date: 01/02/2024 CLINICAL DATA:  Status post pacemaker insertion EXAM: CHEST - 2 VIEW COMPARISON:  Chest radiograph dated 12/30/2023 FINDINGS: Lines/tubes: Left chest wall pacemaker leads project over the right atrium and ventricle. Lungs: Low lung volumes with bronchovascular crowding. Dense left basilar opacity. Pleura: Small bilateral pleural effusions.  No pneumothorax. Heart/mediastinum: Similar enlarged cardiomediastinal silhouette. Bones: No acute osseous abnormality. IMPRESSION: 1. Left chest wall pacemaker leads project over the right atrium and ventricle. No pneumothorax. 2. Small bilateral pleural effusions. 3. Dense left basilar opacity, likely atelectasis. Electronically Signed   By: Limin  Xu M.D.   On: 01/02/2024 08:35   EP PPM/ICD IMPLANT Result Date: 01/01/2024  CONCLUSIONS:  1.  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  Successful dual-chamber permanent pacemaker with left bundle area lead  3.  No early apparent complications.  4.  Hold anticoagulation for 5 days (okay to restart January 07, 2024)   DG Chest [place] 1 View Result Date: 12/30/2023 CLINICAL DATA:  Acute respiratory distress. EXAM: PORTABLE CHEST 1 VIEW COMPARISON:  12/29/2023. FINDINGS: The heart is enlarged and the mediastinal contour stable. There is chronic elevation of right diaphragm. Lung volumes are low. No consolidation, effusion, or pneumothorax is seen. No acute osseous abnormality. IMPRESSION: Stable chest with no active disease. Electronically Signed   By: Wyvonnia Heimlich M.D.   On: 12/30/2023 18:51   ECHOCARDIOGRAM COMPLETE Result Date: 12/30/2023    ECHOCARDIOGRAM REPORT   Patient Name:   NORI WINEGAR Date of Exam: 12/30/2023 Medical Rec #:  [id number]      Height:       61.0 in Accession #:    [id number]     Weight:       219.2 lb Date of Birth:  April 25, 1941     BSA:          1.964 m Patient Age:    82 years       BP:           108/65 mmHg Patient Gender: F              HR:           78 bpm. Exam Location:  Inpatient Procedure: 2D Echo (Both Spectral and Color Flow Doppler were utilized during            procedure). Indications:    Atrial fibrillation  History:        Patient has no prior history of Echocardiogram examinations.                 Arrythmias:Atrial Fibrillation.  Sonographer:    Dione Franks RDCS Referring Phys: [id number] RAMESH KC  Sonographer Comments: Image acquisition challenging due to respiratory motion and Image acquisition challenging due to patient body habitus. IMPRESSIONS  1. Left ventricular ejection fraction, by estimation, is 60 to 65%. The left ventricle has normal function. The left ventricle has no regional wall motion abnormalities. There is mild left ventricular hypertrophy. Left ventricular diastolic parameters are indeterminate.  2. Right ventricular systolic function was not well visualized. The right ventricular size is not well visualized. There is moderately elevated pulmonary artery systolic pressure.  3. Left atrial size was moderately dilated.  4. The mitral valve is normal in structure. Trivial mitral valve regurgitation. No evidence of mitral stenosis.  5. The tricuspid valve is abnormal. Tricuspid valve regurgitation is moderate.  6. The aortic valve is tricuspid. There is mild calcification of the aortic valve. There is mild thickening of the aortic valve. Aortic valve regurgitation is not visualized. No aortic stenosis is present.  7. The inferior vena cava is dilated in size with >50% respiratory variability, suggesting right atrial pressure of 8 mmHg. FINDINGS  Left Ventricle: Left ventricular ejection fraction, by estimation, is 60 to 65%. The left ventricle  has normal function. The left ventricle has no regional wall motion abnormalities. Definity  contrast agent was given IV to delineate the left ventricular  endocardial borders. The left ventricular internal cavity size was normal in size. There is mild left ventricular hypertrophy. Left ventricular diastolic parameters are indeterminate. Right Ventricle: RV not well visualized. Grossly appears normal in size and function. The right ventricular size is not well visualized. Right vetricular wall thickness was not well visualized. Right ventricular systolic function was not well visualized.  There is moderately elevated pulmonary artery systolic pressure. The tricuspid regurgitant velocity is 3.56 m/s, and with an assumed right atrial pressure of 8 mmHg, the estimated right ventricular systolic pressure is 58.7 mmHg. Left Atrium: Left atrial size was moderately dilated. Right Atrium: Right atrial size was normal in size. Pericardium: There is no evidence of pericardial effusion. Mitral Valve: The mitral valve is normal in structure. There is mild thickening of the mitral valve leaflet(s). There is mild calcification of the mitral valve leaflet(s). Mild mitral annular calcification. Trivial mitral valve regurgitation. No evidence  of mitral valve stenosis. Tricuspid Valve: The tricuspid valve is abnormal. Tricuspid valve regurgitation is moderate . No evidence of tricuspid stenosis. Aortic Valve: The aortic valve is tricuspid. There is mild calcification of the aortic valve. There is mild thickening of the aortic valve. There is mild aortic valve annular calcification. Aortic valve regurgitation is not visualized. No aortic stenosis  is present. Aortic valve mean gradient measures 2.9 mmHg. Aortic valve peak gradient measures 7.3 mmHg. Aortic valve area, by VTI measures 2.09 cm. Pulmonic Valve: The pulmonic valve was not well visualized. Pulmonic valve regurgitation is not visualized. No evidence of pulmonic stenosis.  Aorta: The aortic root and ascending aorta are structurally normal, with no evidence of dilitation. Venous: The inferior vena cava is dilated in size with greater than 50% respiratory variability, suggesting right atrial pressure of 8 mmHg. IAS/Shunts: The interatrial septum was not well visualized.  LEFT VENTRICLE PLAX 2D LVIDd:         3.90 cm LVIDs:         2.90 cm LV PW:         1.00 cm LV IVS:        1.20 cm LVOT diam:     1.90 cm LV SV:         48 LV SV Index:   25 LVOT Area:     2.84 cm  RIGHT VENTRICLE          IVC RV Basal diam:  2.30 cm  IVC diam: 2.60 cm TAPSE (M-mode): 1.3 cm LEFT ATRIUM             Index  RIGHT ATRIUM           Index LA diam:        4.60 cm 2.34 cm/m   RA Area:     14.20 cm LA Vol (A2C):   93.5 ml 47.61 ml/m  RA Volume:   31.90 ml  16.24 ml/m LA Vol (A4C):   83.3 ml 42.42 ml/m LA Biplane Vol: 88.8 ml 45.22 ml/m  AORTIC VALVE AV Area (Vmax):    1.79 cm AV Area (Vmean):   2.06 cm AV Area (VTI):     2.09 cm AV Vmax:           135.26 cm/s AV Vmean:          78.313 cm/s AV VTI:            0.232 m AV Peak Grad:      7.3 mmHg AV Mean Grad:      2.9 mmHg LVOT Vmax:         85.20 cm/s LVOT Vmean:        56.800 cm/s LVOT VTI:          0.171 m LVOT/AV VTI ratio: 0.74  AORTA Ao Root diam: 3.10 cm Ao Asc diam:  3.20 cm TRICUSPID VALVE TR Peak grad:   50.7 mmHg TR Vmax:        356.00 cm/s  SHUNTS Systemic VTI:  0.17 m Systemic Diam: 1.90 cm Armida Lander MD Electronically signed by Armida Lander MD Signature Date/Time: 12/30/2023/3:32:16 PM    Final    DG Ankle 2 Views Left Result Date: 12/30/2023 CLINICAL DATA:  Left ankle, two views EXAM: LEFT ANKLE - 2 VIEW COMPARISON:  None Available. FINDINGS: No evidence of acute fracture or malalignment. Tibiotalar joint is symmetric. Artifact overlies the distal soft tissues. Moderate to large calcaneal spurring. IMPRESSION: 1. No evidence of acute fracture. 2. Calcaneal spurring. Electronically Signed   By: Reagan Camera M.D.   On:  12/30/2023 14:14   CT LUMBAR SPINE WO CONTRAST Result Date: 12/30/2023 EXAM: CT OF THE LUMBAR SPINE WITHOUT CONTRAST 12/30/2023 11:42:11 AM TECHNIQUE: CT of the lumbar spine was performed without the administration of intravenous contrast. Multiplanar reformatted images are provided for review. Automated exposure control, iterative reconstruction, and/or weight based adjustment of the mA/kV was utilized to reduce the radiation dose to as low as reasonably achievable. COMPARISON: Lumbar spine radiograph 01/08/2020 and lumbar spine MRI 07/20/2013. CLINICAL HISTORY: Low back pain, trauma. Back pain. FINDINGS: BONES AND ALIGNMENT: Conventional lumbosacral anatomy is assumed with 5 non-rib-bearing, lumbar-type vertebral bodies. Congenitally short pedicles throughout the lumbar spine. No acute fracture or suspicious bone lesion. Normal alignment. Ankylosis of the bilateral sacroiliac joints. DEGENERATIVE CHANGES: At L2-3, left eccentric disc bulge and left greater than right facet arthropathy result in at least mild central spinal canal stenosis with severe narrowing of the left subarticular zone and moderate bilateral neural foraminal narrowing. At L3-4, disc extrusion and facet arthropathy result in mild spinal canal stenosis and moderate left neural foraminal narrowing. At L4-5, disc bulge and moderate bilateral facet arthropathy result in moderate spinal canal stenosis with severe left and moderate right neural foraminal narrowing. At L5-S1, disc bulge and facet arthropathy result in severe bilateral neural foraminal narrowing. SOFT TISSUES: No acute abnormality. Atherosclerotic calcifications of the abdominal aorta and its branches. IMPRESSION: 1. No acute traumatic injury to the lumbar spine . 2. Multilevel lumbar spondylosis, worse than L4-5, where there is moderate spinal canal stenosis, severe left and moderate right neural foraminal  narrowing. Electronically signed by: Audra Blend MD 12/30/2023 11:54 AM  EDT RP Workstation: [id number]   CT CHEST ABDOMEN PELVIS W CONTRAST Result Date: 12/29/2023 CLINICAL DATA:  Blunt trauma, status post fall. Wound bottom of steps. EXAM: CT CHEST, ABDOMEN, AND PELVIS WITH CONTRAST TECHNIQUE: Multidetector CT imaging of the chest, abdomen and pelvis was performed following the standard protocol during bolus administration of intravenous contrast. RADIATION DOSE REDUCTION: This exam was performed according to the departmental dose-optimization program which includes automated exposure control, adjustment of the mA and/or kV according to patient size and/or use of iterative reconstruction technique. CONTRAST:  60mL OMNIPAQUE  IOHEXOL  350 MG/ML SOLN COMPARISON:  None Available. FINDINGS: CT CHEST FINDINGS Cardiovascular: No evidence of acute aortic or traumatic vascular injury. The heart is normal in size. There is a small pericardial effusion. Coronary artery calcifications. Mild aortic atherosclerosis. Mediastinum/Nodes: No mediastinal hemorrhage or hematoma. No mediastinal or hilar adenopathy. No pneumomediastinum. Decompressed esophagus. Lungs/Pleura: No pneumothorax. No pulmonary contusion. Small right pleural effusion appears to be simple fluid density. Hypoventilatory changes in the dependent lungs. Mild subpleural reticulation. Trachea and central airways are clear. Musculoskeletal: No acute fracture of the ribs, sternum, included clavicles or shoulder girdles. Degenerative change throughout the thoracic spine and both shoulders. No confluent chest wall contusion. CT ABDOMEN PELVIS FINDINGS Hepatobiliary: No hepatic injury or perihepatic hematoma. No focal liver abnormality. Cholecystectomy without biliary dilatation. Pancreas: No evidence of injury. No ductal dilatation or inflammation. Spleen: No splenic injury or perisplenic hematoma. Adrenals/Urinary Tract: No adrenal nodule or hemorrhage. No evidence of renal injury. No hydronephrosis. Homogeneous renal enhancement.  Urinary bladder is decompressed. No evidence of bladder injury. Stomach/Bowel: No evidence of bowel injury or mesenteric hematoma. Stomach is decompressed. Small duodenal diverticulum. No bowel wall thickening or inflammation. Small volume of stool throughout the colon. Colonic diverticulosis without diverticulitis. The appendix is not confidently seen. Vascular/Lymphatic: Aortic atherosclerosis. No aortic aneurysm. No vascular injury or retroperitoneal fluid. No bulky adenopathy. Reproductive: Status post hysterectomy. No adnexal masses. Other: No free air or free fluid.  No confluent body wall contusion. Musculoskeletal: Right hip arthroplasty. No periprosthetic lucency. No acute fracture of the pelvis or lumbar spine. Degenerative change of the lumbar spine and left hip. IMPRESSION: 1. No evidence of acute traumatic injury to the chest, abdomen, or pelvis. 2. Small right pleural effusion. Small pericardial effusion. 3. Coronary artery calcifications. 4. Colonic diverticulosis without diverticulitis. Aortic Atherosclerosis (ICD10-I70.0). Electronically Signed   By: Chadwick Colonel M.D.   On: 12/29/2023 16:36   DG Pelvis Portable Result Date: 12/29/2023 CLINICAL DATA:  Trauma, fall. EXAM: PORTABLE PELVIS 1-2 VIEWS COMPARISON:  None Available. FINDINGS: Technically limited by soft tissue attenuation from habitus. No evidence of pelvic fracture. Right hip arthroplasty is only partially included in the field of view, intact were visualized. Left hip osteoarthritis without hip dislocation. Pubic rami are grossly intact. No pubic symphyseal or sacroiliac diastasis. IMPRESSION: 1. No pelvic fracture. 2. Right hip arthroplasty. 3. Left hip osteoarthritis. Electronically Signed   By: Chadwick Colonel M.D.   On: 12/29/2023 16:29   DG Chest Port 1 View Result Date: 12/29/2023 CLINICAL DATA:  Trauma, fall. EXAM: PORTABLE CHEST 1 VIEW COMPARISON:  Radiograph 08/24/2020 FINDINGS: Low lung volumes limit assessment.  Prominent heart size likely accentuated by technique. Artifact projects over the right lung base. No evidence of pneumothorax, pleural effusion or focal airspace disease. No grossly displaced fracture on limited osseous assessment. IMPRESSION: Hypoventilatory chest without evidence of acute traumatic injury. Electronically Signed   By:  Chadwick Colonel M.D.   On: 12/29/2023 16:29   CT HEAD WO CONTRAST Result Date: 12/29/2023 CLINICAL DATA:  Head trauma, moderate-severe; Polytrauma, blunt. Level 2 trauma. Fall. Found at the bottom of steps unconscious. EXAM: CT HEAD WITHOUT CONTRAST CT CERVICAL SPINE WITHOUT CONTRAST TECHNIQUE: Multidetector CT imaging of the head and cervical spine was performed following the standard protocol without intravenous contrast. Multiplanar CT image reconstructions of the cervical spine were also generated. RADIATION DOSE REDUCTION: This exam was performed according to the departmental dose-optimization program which includes automated exposure control, adjustment of the mA and/or kV according to patient size and/or use of iterative reconstruction technique. COMPARISON:  CT head and cervical spine 06/30/2008 FINDINGS: CT HEAD FINDINGS Brain: There is no evidence of an acute infarct, intracranial hemorrhage, mass, midline shift, or extra-axial fluid collection. Mild cerebral atrophy is within normal limits for age. Cerebral white matter hypodensities are nonspecific but compatible with mild chronic small vessel ischemic disease. Vascular: Calcified atherosclerosis at the skull base. No hyperdense vessel. Skull: No acute fracture or suspicious lesion. Sinuses/Orbits: Chronic sinusitis including subtotal opacification of the right sphenoid sinus. Small chronic left mastoid effusion. Bilateral cataract extraction. Other: None. CT CERVICAL SPINE FINDINGS Alignment: No significant subluxation. Skull base and vertebrae: No acute fracture or suspicious lesion. Soft tissues and spinal canal: No  prevertebral fluid or swelling. No visible canal hematoma. Disc levels: Mild spondylosis. Advanced multilevel cervical facet arthrosis with facet ankylosis on the left at C2-3 and right at C3-4. Severe neural foraminal stenosis on the right at C3-4 and on the left at C4-5. No evidence of high-grade spinal canal stenosis. Upper chest: Evaluated on the separately reported contemporaneous CT of the chest, abdomen, and pelvis. Other: None. IMPRESSION: 1. No evidence of acute intracranial abnormality or cervical spine fracture. 2. Mild chronic small vessel ischemic disease. Electronically Signed   By: Aundra Lee M.D.   On: 12/29/2023 16:13   CT CERVICAL SPINE WO CONTRAST Result Date: 12/29/2023 CLINICAL DATA:  Head trauma, moderate-severe; Polytrauma, blunt. Level 2 trauma. Fall. Found at the bottom of steps unconscious. EXAM: CT HEAD WITHOUT CONTRAST CT CERVICAL SPINE WITHOUT CONTRAST TECHNIQUE: Multidetector CT imaging of the head and cervical spine was performed following the standard protocol without intravenous contrast. Multiplanar CT image reconstructions of the cervical spine were also generated. RADIATION DOSE REDUCTION: This exam was performed according to the departmental dose-optimization program which includes automated exposure control, adjustment of the mA and/or kV according to patient size and/or use of iterative reconstruction technique. COMPARISON:  CT head and cervical spine 06/30/2008 FINDINGS: CT HEAD FINDINGS Brain: There is no evidence of an acute infarct, intracranial hemorrhage, mass, midline shift, or extra-axial fluid collection. Mild cerebral atrophy is within normal limits for age. Cerebral white matter hypodensities are nonspecific but compatible with mild chronic small vessel ischemic disease. Vascular: Calcified atherosclerosis at the skull base. No hyperdense vessel. Skull: No acute fracture or suspicious lesion. Sinuses/Orbits: Chronic sinusitis including subtotal opacification of  the right sphenoid sinus. Small chronic left mastoid effusion. Bilateral cataract extraction. Other: None. CT CERVICAL SPINE FINDINGS Alignment: No significant subluxation. Skull base and vertebrae: No acute fracture or suspicious lesion. Soft tissues and spinal canal: No prevertebral fluid or swelling. No visible canal hematoma. Disc levels: Mild spondylosis. Advanced multilevel cervical facet arthrosis with facet ankylosis on the left at C2-3 and right at C3-4. Severe neural foraminal stenosis on the right at C3-4 and on the left at C4-5. No evidence of high-grade spinal canal stenosis.  Upper chest: Evaluated on the separately reported contemporaneous CT of the chest, abdomen, and pelvis. Other: None. IMPRESSION: 1. No evidence of acute intracranial abnormality or cervical spine fracture. 2. Mild chronic small vessel ischemic disease. Electronically Signed   By: Aundra Lee M.D.   On: 12/29/2023 16:13    Microbiology: Results for orders placed or performed during the hospital encounter of 12/29/23  Surgical PCR screen     Status: None   Collection Time: 12/31/23  8:40 AM   Specimen: Nasal Mucosa; Nasal Swab  Result Value Ref Range Status   MRSA, PCR NEGATIVE NEGATIVE Final   Staphylococcus aureus NEGATIVE NEGATIVE Final    Comment: (NOTE) The Xpert SA Assay (FDA approved for NASAL specimens in patients 60 years of age and older), is one component of a comprehensive surveillance program. It is not intended to diagnose infection nor to guide or monitor treatment. Performed at [facility], [address] [place], [place] [address]     Labs: CBC: Recent Labs  Lab 01/04/24 1515 01/04/24 1714 01/05/24 0543 01/09/24 1051 01/10/24 0924  WBC 8.1 7.3 7.4 4.8 5.1  HGB 10.4* 9.7* 9.8* 10.5* 10.4*  HCT 33.5* 30.9* 31.0* 32.6* 32.1*  MCV 93.1 93.9 95.1 91.3 91.2  PLT 188 164 184 184 195   Basic Metabolic Panel: Recent Labs  Lab 01/04/24 0421 01/04/24 1515 01/04/24 1714  01/05/24 0543 01/09/24 1051 01/10/24 0924  NA 138 137  --  139 138 138  K 3.6 3.6  --  3.9 3.5 3.5  CL 103 101  --  104 99 97*  CO2 26 24  --  26 28 31   GLUCOSE 120* 146*  --  94 111* 108*  BUN 20 24*  --  26* 14 10  CREATININE 1.01* 0.97 0.94 0.88 0.80 0.82  CALCIUM 8.7* 8.6*  --  8.7* 8.5* 8.4*  MG 1.8 1.9  --   --   --   --   PHOS 2.9  --   --   --   --   --    Liver Function Tests: Recent Labs  Lab 01/04/24 0421 01/05/24 0543 01/09/24 1051 01/10/24 0924  AST 24 20 34 36  ALT 13 13 18 20   ALKPHOS 66 66 96 98  BILITOT 1.7* 1.5* 1.8* 1.7*  PROT 6.5 6.1* 6.6 6.5  ALBUMIN  3.2* 3.2* 3.1* 3.2*   CBG: No results for input(s): GLUCAP in the last 168 hours.  Discharge time spent:  35 minutes.  Signed: Ezzard Holms, MD [facility] 01/10/2024

## 2024-01-10 NOTE — Evaluation (Signed)
 Physical Therapy Evaluation Patient Details Name: Barbara Soto MRN: [id number] DOB: Apr 20, 1941 Today's Date: 01/10/2024  History of Present Illness  83 yo female admitted 01/04/24 with syncope after having just discharged same date. Admission 5/30 after fall found at bottom of stairs, coded Jan 13, 2024, 15-Jan-2024 PPM, 6/3 syncope.  PMH: recent L ankle injury from a fall, depression, HTN, Afib  Clinical Impression  Pt presents to PT with deficits in strength, power, endurance, gait, recall of pacemaker precautions. Pt is able to transfer and ambulate with support of RW, PT provides reinforcement of pacemaker precautions as the pt often attempts to initiate transfers by pushing from recliner armrest with BUE. Pt denies symptoms of pre-syncope throughout evaluation although BP in standing is much lower than BP in sitting, documented below in general comments section. PT provides reinforcement of precautions related to syncope. PT recommends discharge home with HHPT when medically appropriate.        If plan is discharge home, recommend the following: A little help with walking and/or transfers;A little help with bathing/dressing/bathroom;Assist for transportation;Help with stairs or ramp for entrance   Can travel by private vehicle        Equipment Recommendations None recommended by PT  Recommendations for Other Services       Functional Status Assessment Patient has had a recent decline in their functional status and demonstrates the ability to make significant improvements in function in a reasonable and predictable amount of time.     Precautions / Restrictions Precautions Precautions: Fall;ICD/Pacemaker Recall of Precautions/Restrictions: Impaired Precaution/Restrictions Comments: intermittent verbal cues to limit pushing with LUE during transfers Restrictions Weight Bearing Restrictions Per Provider Order: No LUE Weight Bearing Per Provider Order:  (limit pushing/pulling through LUE due to PPM  precautions)      Mobility  Bed Mobility                    Transfers Overall transfer level: Needs assistance Equipment used: Rolling walker (2 wheels) Transfers: Sit to/from Stand Sit to Stand: Contact guard assist           General transfer comment: verbal cues to limit pushing through LUE as pt often attempting to push from armrest with LUE    Ambulation/Gait Ambulation/Gait assistance: Contact guard assist Gait Distance (Feet): 100 Feet (additional trial of 20') Assistive device: Rolling walker (2 wheels) Gait Pattern/deviations: Step-through pattern Gait velocity: reduced Gait velocity interpretation: <1.8 ft/sec, indicate of risk for recurrent falls   General Gait Details: slowed step-through gait  Stairs            Wheelchair Mobility     Tilt Bed    Modified Rankin (Stroke Patients Only)       Balance Overall balance assessment: Needs assistance Sitting-balance support: No upper extremity supported, Feet supported Sitting balance-Leahy Scale: Good     Standing balance support: Single extremity supported, Reliant on assistive device for balance Standing balance-Leahy Scale: Poor                               Pertinent Vitals/Pain Pain Assessment Pain Assessment: No/denies pain    Home Living Family/patient expects to be discharged to:: Private residence Living Arrangements: Children Available Help at Discharge: Available PRN/intermittently;Family;Friend(s) Type of Home: House Home Access: Stairs to enter Entrance Stairs-Rails: None Entrance Stairs-Number of Steps: 1+1+1   Home Layout: One level Home Equipment: Shower seat;BSC/3in1;Adaptive equipment;Cane - quad;Lift chair;Rolling Walker (2 wheels)  Prior Function Prior Level of Function : Independent/Modified Independent;Driving;Working/employed             Mobility Comments: Using 3 wheel walker at home; driving ADLs Comments: work 4 nights/week as an  Engineer, production for a lady with dementia. Ind; gardening     Extremity/Trunk Assessment   Upper Extremity Assessment Upper Extremity Assessment: LUE deficits/detail LUE Deficits / Details: Pt ROM WFL, PPM precautions deferred MMT.    Lower Extremity Assessment Lower Extremity Assessment: Generalized weakness    Cervical / Trunk Assessment Cervical / Trunk Assessment: Kyphotic  Communication   Communication Communication: No apparent difficulties    Cognition Arousal: Alert Behavior During Therapy: WFL for tasks assessed/performed   PT - Cognitive impairments: No apparent impairments                         Following commands: Intact       Cueing Cueing Techniques: Verbal cues     General Comments General comments (skin integrity, edema, etc.): pt continues to have a drop in BP when standing compared to sitting. pressure in standing is 104/58 this session and BP in sitting is 144/74. Pt denies symptoms of pre-syncope throughout session    Exercises     Assessment/Plan    PT Assessment Patient needs continued PT services  PT Problem List Decreased strength;Decreased activity tolerance;Decreased balance;Decreased mobility;Cardiopulmonary status limiting activity;Pain;Decreased knowledge of precautions       PT Treatment Interventions DME instruction;Gait training;Stair training;Functional mobility training;Therapeutic activities;Therapeutic exercise;Balance training;Neuromuscular re-education;Patient/family education    PT Goals (Current goals can be found in the Care Plan section)  Acute Rehab PT Goals Patient Stated Goal: to return to independence PT Goal Formulation: With patient Time For Goal Achievement: 01/24/24 Potential to Achieve Goals: Good    Frequency Min 2X/week     Co-evaluation               AM-PAC PT 6 Clicks Mobility  Outcome Measure Help needed turning from your back to your side while in a flat bed without using bedrails?: A  Little Help needed moving from lying on your back to sitting on the side of a flat bed without using bedrails?: A Little Help needed moving to and from a bed to a chair (including a wheelchair)?: A Little Help needed standing up from a chair using your arms (e.g., wheelchair or bedside chair)?: A Little Help needed to walk in hospital room?: A Little Help needed climbing 3-5 steps with a railing? : A Little 6 Click Score: 18    End of Session Equipment Utilized During Treatment: Gait belt Activity Tolerance: Patient tolerated treatment well Patient left: in chair;with call bell/phone within reach Nurse Communication: Mobility status PT Visit Diagnosis: Unsteadiness on feet (R26.81);Other abnormalities of gait and mobility (R26.89);Muscle weakness (generalized) (M62.81);Difficulty in walking, not elsewhere classified (R26.2)    Time: 1610-9604 PT Time Calculation (min) (ACUTE ONLY): 23 min   Charges:   PT Evaluation $PT Eval Low Complexity: 1 Low   PT General Charges $$ ACUTE PT VISIT: 1 Visit         Rexie Catena, PT, DPT Acute Rehabilitation Office [phone]   Rexie Catena 01/10/2024, 2:58 PM

## 2024-01-10 NOTE — Progress Notes (Signed)
 Physical Therapy Quick Note  PT has completed initial evaluation.    Overall, patient at supervision assistance level.   PT Follow up recommended: Home Health PT Equipment recommended:  None recommended Complete evaluation note to follow.     Arlyss Gandy, PT, DPT Acute Rehabilitation Office [phone]

## 2024-01-10 NOTE — Progress Notes (Signed)
  Progress Note  Patient Name: Barbara Soto Date of Encounter: 01/10/2024 [facility] Cardiologist: Euell Herrlich, MD   Interval Summary   Resting in bed, no complaints  Orthostatics unimpressive   Vital Signs Vitals:   01/10/24 0710 01/10/24 0800 01/10/24 1101 01/10/24 1200  BP: (!) 160/97  (!) 164/93 (!) 104/58  Pulse: 98  96   Resp: 19 (!) 22 (!) 22 (!) 24  Temp: 98.1 F (36.7 C)     TempSrc: Oral     SpO2: 95%  94%   Weight:      Height:        Intake/Output Summary (Last 24 hours) at 01/10/2024 1448 Last data filed at 01/10/2024 1300 Gross per 24 hour  Intake 280 ml  Output 450 ml  Net -170 ml      01/10/2024    4:51 AM 01/08/2024    2:08 AM 01/07/2024    3:55 AM  Last 3 Weights  Weight (lbs) 220 lb 7.4 oz 221 lb 5.5 oz 228 lb 9.9 oz  Weight (kg) 100 kg 100.4 kg 103.7 kg      Telemetry/ECG  Atrial fibrillation, HR 90s - Personally Reviewed  Physical Exam  GEN: No acute distress.   Neck: No JVD Cardiac: RRR, no murmurs, rubs, or gallops.  Respiratory: Clear to auscultation bilaterally. 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 Fib  Neurology consulted, recommend long-term EEG with orthostatics TID Initial orthostatics unimpressive Follow up appointment made with general cardiology 6/19 - for hypertension, I have reviewed this with an expert colleague. Would recommend she keep a log of Bps at home twice a day which she will do. If BP routinely exceeds 170/100, can consider prn hydralazine  25 mg daily. As outpatient, if BP remains elevated, consider adding low dose amlodipine  2.5 mg daily and monitoring response. She is in agreement  with the plan to check BP twice daily and report log at upcoming appt.   Atrial fibrillation Device interrogation showed persistent A. Fib  HR 90s Continue Toprol  25 mg BID  EP had suggested starting Eliquis  on 6/8 -- will recheck CBC today, if stable will restart Eliquis  with low threshold to stop if patient continues to have recurrent syncope/falls   Per primary CKD stage 3a Left ankle injury    Cardiology will sign off.  For questions or updates, please contact [facility] Please consult [email or web address] for contact info under    Barbara Riga A Cyan Clippinger, MD 01/10/24 2:48 PM

## 2024-01-10 NOTE — Plan of Care (Signed)

## 2024-01-11 ENCOUNTER — Telehealth: Payer: Self-pay | Admitting: Cardiology

## 2024-01-11 NOTE — Telephone Encounter (Signed)
 Patient was discharged from hospital yesterday following syncopal episode.  She reports she noticed swelling to her feet and legs today. She denies any redness, warmth, or pain but does note slight soreness to right leg. She describes her feets and legs as being tight.   She states she is up walking doing light chores today. She does not have much of an appetite, ate 2-3 bites of a frozen meal today. She drinks Pepsi, and just uses sips of water to take medications. She states she is elevating her feet on 2 pillows when she is sitting down.  Patient denies any SOB, CP, or dizziness.  Encouraged patient to continue to elevate legs/feet when sitting and take breaks every 1-2 hours to stand up and walk for a few minutes, and do not add salt to meals.  Advised patient to go to ED for evaluation if she develops above symptoms or if swelling does not improve. Patient verbalized understanding.  Patient has appt for PPM wound check on 6/17 and hospital F/U appt on 6/19 at our office.

## 2024-01-11 NOTE — Telephone Encounter (Signed)
 Pt c/o swelling/edema: STAT if pt has developed SOB within 24 hours  If swelling, where is the swelling located?  Legs and feet  How much weight have you gained and in what time span?   Unknown  Have you gained 2 pounds in a day or 5 pounds in a week?   Unknown  Do you have a log of your daily weights (if so, list)?   No  Are you currently taking a fluid pill?   Yes  Are you currently SOB?   No  Have you traveled recently in a car or plane for an extended period of time?   No  Patient stated she was in the hospital and was released yesterday and is concerned she is having swelling in her legs and feet.

## 2024-01-16 ENCOUNTER — Ambulatory Visit: Attending: Cardiology

## 2024-01-16 DIAGNOSIS — I129 Hypertensive chronic kidney disease with stage 1 through stage 4 chronic kidney disease, or unspecified chronic kidney disease: Secondary | ICD-10-CM | POA: Diagnosis not present

## 2024-01-16 DIAGNOSIS — R55 Syncope and collapse: Secondary | ICD-10-CM

## 2024-01-16 DIAGNOSIS — N1831 Chronic kidney disease, stage 3a: Secondary | ICD-10-CM | POA: Diagnosis not present

## 2024-01-16 DIAGNOSIS — G4700 Insomnia, unspecified: Secondary | ICD-10-CM | POA: Diagnosis not present

## 2024-01-16 DIAGNOSIS — Z6836 Body mass index (BMI) 36.0-36.9, adult: Secondary | ICD-10-CM | POA: Diagnosis not present

## 2024-01-16 DIAGNOSIS — I482 Chronic atrial fibrillation, unspecified: Secondary | ICD-10-CM | POA: Diagnosis not present

## 2024-01-16 DIAGNOSIS — F321 Major depressive disorder, single episode, moderate: Secondary | ICD-10-CM | POA: Diagnosis not present

## 2024-01-16 NOTE — Progress Notes (Signed)
 Normal AF/VS chamber pacemaker wound check. Presenting rhythm: AF/VS. Wound well healed. Routine testing performed. Thresholds, sensing, and impedances consistent with implant measurements and at 3.5V safety margin/auto capture until 3 month visit. AT/AF burden 99%. Reviewed arm restrictions to continue for 6 weeks total post op.  Pt enrolled in remote follow-up.

## 2024-01-16 NOTE — Patient Instructions (Signed)

## 2024-01-18 ENCOUNTER — Encounter: Payer: Self-pay | Admitting: Physician Assistant

## 2024-01-18 ENCOUNTER — Ambulatory Visit: Attending: Physician Assistant | Admitting: Cardiology

## 2024-01-18 ENCOUNTER — Other Ambulatory Visit: Payer: Self-pay | Admitting: Cardiology

## 2024-01-18 VITALS — BP 160/90 | HR 97 | Ht 61.0 in | Wt 209.0 lb

## 2024-01-18 DIAGNOSIS — I4819 Other persistent atrial fibrillation: Secondary | ICD-10-CM

## 2024-01-18 DIAGNOSIS — I10 Essential (primary) hypertension: Secondary | ICD-10-CM

## 2024-01-18 DIAGNOSIS — D509 Iron deficiency anemia, unspecified: Secondary | ICD-10-CM

## 2024-01-18 DIAGNOSIS — I482 Chronic atrial fibrillation, unspecified: Secondary | ICD-10-CM

## 2024-01-18 MED ORDER — AMLODIPINE BESYLATE 2.5 MG PO TABS
2.5000 mg | ORAL_TABLET | Freq: Every day | ORAL | 3 refills | Status: AC
Start: 2024-01-18 — End: 2024-05-01

## 2024-01-18 NOTE — Patient Instructions (Signed)
 Medication Instructions:  START AMLODIPINE  2.5 MG DAILY *If you need a refill on your cardiac medications before your next appointment, please call your pharmacy*  Lab Work: CBC, BMP AND IRON PANEL BEFORE CARDIOVERSION If you have labs (blood work) drawn today and your tests are completely normal, you will receive your results only by: MyChart Message (if you have MyChart) OR A paper copy in the mail If you have any lab test that is abnormal or we need to change your treatment, we will call you to review the results.  Testing/Procedures: PROVIDER HAS ORDERED CARDIOVERSION. WILL SEND YOU A LETTER WITH SCHEDULED PROCEDURE TIME AND INSTRUCTIONS.   Follow-Up: At [facility], you and your health needs are our priority.  As part of our continuing mission to provide you with exceptional heart care, our providers are all part of one team.  This team includes your primary Cardiologist (physician) and Advanced Practice Providers or APPs (Physician Assistants and Nurse Practitioners) who all work together to provide you with the care you need, when you need it.  Your next appointment:   KEEP FOLLOW UP AUGUST 2025  Provider:   Olinda Bertrand, DO   Other Instructions CHECK BLOOD PRESSURE 2 TIMES A DAY FOR 2 WEEKS. KEEP RECORD OF READINGS AND SEND VIA MYCHART

## 2024-01-18 NOTE — Progress Notes (Signed)
 DCCV orders. See note 06/19

## 2024-01-18 NOTE — Progress Notes (Signed)
 Cardiology Office Note:  .   Date:  01/18/2024  ID:  Barbara Soto, DOB 31-Aug-1940, MRN [id number] PCP: Windell Hasty, DO  [facility] Providers Cardiologist:  Euell Herrlich, MD {  History of Present Illness: .   Barbara Soto is a 83 y.o. female with history of recurrent syncope due to malignant vasovagal syncope status post Biotronik PPM, persistent atrial fibrillation, CKD.     Malignant vasovagal syncope Admission 11/2023 found unconscious, agonal breathing, received CPR with immediate ROSC, and found to be in atrial fibrillation.  Later in admission noted to have marked bradycardia 20s and another unresponsive episode.  EP consulted and felt to be malignant vasovagal syncope, implanted PPM.  Had another syncopal event during hospitalization Same day of discharge had another episode of syncope, no loss of consciousness.  Although consult initially said unconscious 2 minutes with shaking, urinary incontinence.  Device interrogation normal.  Neurology consulted, recommended long-term EEG.  Negative orthostatics.  Delayed initiation of Eliquis  given PPM.  Low threshold to stop with falls.  Atrial fibrillation 11/2023 preserved LVEF.  Moderately dilated LA.  Social history      Patient with history of recent admission May 2025 for malignant vasovagal syncope now status post PPM.  She is accompanied today with her daughter who takes very close care of her.  Reports that she has done exceptionally well posthospitalization and has not had any further recurrences of syncope.  She is working with PT and doing very well functionally with her exercise regiment.  She reports blood pressure has been persistently in the 160s consistently.  She brought a blood pressure log however there are only 4 readings on this all in the 160s.  Other than that she reports mild chest pain that only started after she got chest compressions but is mild in has no exertional pain with this.  Tender to  palpation.  Compliant with all of her medications and tolerating well.  Not following and feels back to baseline.  Does not notice any palpitations, shortness of breath.  Notes left lower extremity edema that started after her ankle fracture.   ROS: Denies: Chest pain, shortness of breath, orthopnea, peripheral edema, palpitations, decreased exercise intolerance, fatigue, lightheadedness.   Studies Reviewed: Aaron Aas    EKG Interpretation Date/Time:  Thursday January 18 2024 16:19:11 EDT Ventricular Rate:  97 PR Interval:    QRS Duration:  96 QT Interval:  316 QTC Calculation: 401 R Axis:   -55  Text Interpretation: Atrial fibrillation Left anterior fascicular block Cannot rule out Inferior infarct (masked by fascicular block?) , age undetermined Anterolateral infarct , age undetermined When compared with ECG of 05-Sep-2017 14:58, PREVIOUS ECG IS PRESENT Confirmed by Morgan Arab [phone]) on 01/18/2024 4:28:46 PM    Risk Assessment/Calculations:    CHA2DS2-VASc Score = 4   This indicates a 4.8% annual risk of stroke. The patient's score is based upon: CHF History: 0 HTN History: 1 Diabetes History: 0 Stroke History: 0 Vascular Disease History: 0 Age Score: 2 Gender Score: 1       Physical Exam:   VS:  BP (!) 160/90 (BP Location: Left Arm, Patient Position: Sitting, Cuff Size: Large)   Pulse 97   Ht 5' 1 (1.549 m)   Wt 209 lb (94.8 kg)   SpO2 93%   BMI 39.49 kg/m    Wt Readings from Last 3 Encounters:  01/18/24 209 lb (94.8 kg)  01/10/24 220 lb 7.4 oz (100 kg)  12/29/23 219 lb  3.2 oz (99.4 kg)    GEN: Well nourished, well developed in no acute distress NECK: No JVD; No carotid bruits CARDIAC: Irregularly irregular no murmur RESPIRATORY:  Clear to auscultation without rales, wheezing or rhonchi  ABDOMEN: Soft, non-tender, non-distended EXTREMITIES:  No edema; No deformity   ASSESSMENT AND PLAN: .    Malignant vasovagal syncope Status post Biotronik PPM She has done very well  posthospitalization without any further recurrences.  She is back to being functional and feels back at her baseline.  Of note her neurology workup was unremarkable with normal EEG.  Persistent atrial fibrillation She is rate controlled heart rates in the 90s today.  Asymptomatic. Discussed with DOD, we will proceed with DCCV after 6 weeks of anticoagulation.  Reports 100% compliancy with this.  Continue with Eliquis  5 mg twice daily (started 06/11).  Has had no falls, seems to be stable anticoagulation candidate currently. Continue with Toprol -XL 25 mg twice daily. Will get CBC/BMP prior to her DCCV.  Get these labs in about 2 weeks, within 1 month of procedure. Echo with preserved EF, moderately dilated LA  Hypertension Uncontrolled, BP persistently in the 160s.  However obviously giving her malignant vasovagal syncope need to be very cautious about BP control.  Will have to find a reasonable balance.  Titrating slowly. Adding on amlodipine  2.5 mg once a day.  May need to be more liberal in BP management. Take blood pressure twice a day every day for the next 2 weeks and send us  a MyChart message with readings. Continue Toprol -XL as above She also has as needed hydralazine  25 mg for BP over 170.  Has not had to take this.  Anemia Hemoglobin 10.4.  Hematocrit 32.1.  MCV 91.  Although MCV is within normal range she has nail changes and affinity for eating ice chips that possibly suggest iron deficiency anemia. Ordering iron panel, depending on this may consider hematology referral     Informed Consent   Shared Decision Making/Informed Consent{  The risks [stroke (1 in 1000), death (1 in 1000), kidney failure [usually temporary] (1 in 500), bleeding (1 in 200), allergic reaction [possibly serious] (1 in 200)], benefits (diagnostic support and management of coronary artery disease) and alternatives of a cardiac catheterization were discussed in detail with Barbara Soto and she is willing to  proceed.     Dispo: Follow-up after DCCV.  Already had appointment with Dr. Albert Huff in August possibly prior to ER admission? Will = leave follow up with him unless otherwise specified. And then will see EP after that.  Signed, Burnetta Cart, PA-C

## 2024-01-20 ENCOUNTER — Telehealth: Payer: Self-pay | Admitting: Cardiology

## 2024-01-20 NOTE — Telephone Encounter (Signed)
 Please call patient and replace Dr. Algis follow up with Dr. Loni. Dr. Acharya will be their primary MD.

## 2024-01-24 ENCOUNTER — Encounter: Payer: Self-pay | Admitting: Internal Medicine

## 2024-01-24 NOTE — Telephone Encounter (Addendum)
 Spoke with patient and she states she has swelling sometimes in her legs and feet. She went to see her PCP and asked for fluid pill. He advised she reach out to her cardiologist.  Patient does not have any swelling today. She states she has not gained any weight or lost any weight. No chest pain or SOB.   Advised to try compression stockings, and elevating her legs and feet when possible and monitoring salt intake for her swelling. Also advised to weigh daily and if she have a weight gain of 2 lbs overnight or 5 lbs in a week to give us  a call. Advised to keep follow up appointment  Will forward to provider.

## 2024-01-24 NOTE — Telephone Encounter (Signed)
 Pt c/o swelling/edema: STAT if pt has developed SOB within 24 hours  If swelling, where is the swelling located? Feet & Legs  How much weight have you gained and in what time span? Has not gained weight  Have you gained 2 pounds in a day or 5 pounds in a week? No  Do you have a log of your daily weights (if so, list)? Still 209 lbs  Are you currently taking a fluid pill? No   Are you currently SOB? No   Have you traveled recently in a car or plane for an extended period of time? No   States the BP this morning is 154/98, but has not taken BP medication yet. Plans to after eating breakfast.   Called pt to rs f/u as requested. Patient is requesting a fluid pill be prescribed. She reports she asked her PCP to prescribe it, but was told she needs to speak with cardiology.   Appt was setup for 7/23 with PA Orren Fabry. Advised I am unsure if it should be before or after cardioversion, but would have nurse confirm upon cb. Please advise.

## 2024-01-24 NOTE — Telephone Encounter (Signed)
 Error

## 2024-02-01 DIAGNOSIS — D509 Iron deficiency anemia, unspecified: Secondary | ICD-10-CM | POA: Diagnosis not present

## 2024-02-01 DIAGNOSIS — I482 Chronic atrial fibrillation, unspecified: Secondary | ICD-10-CM | POA: Diagnosis not present

## 2024-02-02 LAB — BASIC METABOLIC PANEL WITH GFR
BUN/Creatinine Ratio: 14 (ref 12–28)
BUN: 13 mg/dL (ref 8–27)
CO2: 22 mmol/L (ref 20–29)
Calcium: 9 mg/dL (ref 8.7–10.3)
Chloride: 101 mmol/L (ref 96–106)
Creatinine, Ser: 0.93 mg/dL (ref 0.57–1.00)
Glucose: 86 mg/dL (ref 70–99)
Potassium: 4.4 mmol/L (ref 3.5–5.2)
Sodium: 141 mmol/L (ref 134–144)
eGFR: 62 mL/min/1.73 (ref >59)

## 2024-02-02 LAB — CBC
Hematocrit: 35.5 % (ref 34.0–46.6)
Hemoglobin: 11.1 g/dL (ref 11.1–15.9)
MCH: 28.9 pg (ref 26.6–33.0)
MCHC: 31.3 g/dL — ABNORMAL LOW (ref 31.5–35.7)
MCV: 92 fL (ref 79–97)
Platelets: 165 x10E3/uL (ref 150–450)
RBC: 3.84 x10E6/uL (ref 3.77–5.28)
RDW: 13.8 % (ref 11.7–15.4)
WBC: 4.1 x10E3/uL (ref 3.4–10.8)

## 2024-02-02 LAB — IRON,TIBC AND FERRITIN PANEL
Ferritin: 56 ng/mL (ref 15–150)
Iron Saturation: 14 % — ABNORMAL LOW (ref 15–55)
Iron: 50 ug/dL (ref 27–139)
Total Iron Binding Capacity: 363 ug/dL (ref 250–450)
UIBC: 313 ug/dL (ref 118–369)

## 2024-02-05 ENCOUNTER — Ambulatory Visit: Payer: Self-pay | Admitting: Cardiology

## 2024-02-07 ENCOUNTER — Other Ambulatory Visit: Payer: Self-pay | Admitting: Cardiology

## 2024-02-07 MED ORDER — APIXABAN 5 MG PO TABS: 5.0000 mg | ORAL_TABLET | Freq: Two times a day (BID) | ORAL | 5 refills | Status: DC

## 2024-02-07 MED ORDER — METOPROLOL SUCCINATE ER 25 MG PO TB24: 25.0000 mg | ORAL_TABLET | Freq: Two times a day (BID) | ORAL | 3 refills | Status: AC

## 2024-02-07 NOTE — Telephone Encounter (Signed)
*  STAT* If patient is at the pharmacy, call can be transferred to refill team.   1. Which medications need to be refilled? (please list name of each medication and dose if known)  metoprolol  succinate (TOPROL -XL) 25 MG 24 hr tablet apixaban  (ELIQUIS ) 5 MG TABS tablet   2. Which pharmacy/location (including street and city if local pharmacy) is medication to be sent to? [id number] [place], [place] - [address]  3. Do they need a 30 day or 90 day supply?  90 day supply

## 2024-02-07 NOTE — Telephone Encounter (Signed)
 Pt's medication was sent to pt's pharmacy as requested. Confirmation received.

## 2024-02-07 NOTE — Telephone Encounter (Signed)
 Prescription refill request for Eliquis  received. Indication: AF Last office visit: 01/18/24  GORMAN Sluder PA-C Scr: 0.93 on 02/01/24  Epic Age: 83 Weight: 94.8kg  Based on above findings Eliquis  5mg  twice daily is the appropriate dose.  Refill approved.

## 2024-02-13 ENCOUNTER — Ambulatory Visit

## 2024-02-13 DIAGNOSIS — R55 Syncope and collapse: Secondary | ICD-10-CM | POA: Diagnosis not present

## 2024-02-19 NOTE — H&P (View-Only) (Signed)
 Cardiology Office Note   Date:  02/21/2024  ID:  Barbara Soto, DOB 08/04/1940, MRN [id number] PCP: Valentin Skates, DO   HeartCare Providers Cardiologist:  Soyla DELENA Merck, MD   History of Present Illness Barbara Soto is a 83 y.o. female with a past medical history significant for recurrent syncope due to malignant vasovagal syndrome status post Biotronik PPM, persistent atrial fibrillation, CKD here for follow-up appointment.  History includes admission in May 2025 for malignant vasovagal syncope now status post PPM.  Accompanied by her daughter at the last visit January 18, 2024.  Reportedly had done well posthospitalization and had not had any further recurrence of syncope.  Barbara Soto was working with PT and doing very well functionally with her exercise regimen.  Reportedly her blood pressure had been persistently in the 160s.  Barbara Soto had mild chest pain that only started after Barbara Soto got chest compressions which is mild.  Tender to palpation.  Compliant with all medications and tolerating.    Today, Barbara Soto presents with atrial fibrillation who presents with uncontrolled blood pressure and swelling. Barbara Soto is accompanied by her daughter.  Barbara Soto experiences significant and persistent swelling in her feet and legs. No diuretics have been prescribed despite previous consultations. Her daughter is concerned about the swelling's impact on her heart rhythm, especially with an upcoming cardioversion scheduled for August 1st.  Her blood pressure is difficult to control, with recent readings as high as 190/112 mmHg, leading to the rescheduling of procedures. Barbara Soto is on metoprolol  twice daily, amlodipine  2.5 mg once daily, and hydralazine  as needed. Her blood pressure fluctuates significantly, sometimes dropping very low, which has previously led to her heart stopping and requiring CPR. Ultimately ended up a PPM.   Barbara Soto experiences slurred speech, which has been more noticeable in the past week, and feels  exhausted and sluggish, attributing these symptoms to her atrial fibrillation. No weakness or facial droop. Her daughter recalls an incident where her blood pressure dropped significantly, leading to her heart stopping, which was a factor in the decision to implant a pacemaker. Barbara Soto feels weak and unable to walk properly, requiring assistance at times.  Reports no shortness of breath nor dyspnea on exertion. Reports no chest pain, pressure, or tightness. No orthopnea, PND.  Discussed the use of AI scribe software for clinical note transcription with the patient, who gave verbal consent to proceed.   ROS: pertinent ROS in HPI  Studies Reviewed EKG Interpretation Date/Time:  Wednesday February 21 2024 14:47:19 EDT Ventricular Rate:  82 PR Interval:    QRS Duration:  94 QT Interval:  348 QTC Calculation: 406 R Axis:   -61  Text Interpretation: Atrial fibrillation with a competing junctional pacemaker Left axis deviation Low voltage QRS Septal infarct (cited on or before 18-Jan-2024) Inferior infarct (cited on or before 18-Jan-2024) When compared with ECG of 18-Jan-2024 16:19, Questionable change in initial forces of Lateral leads Confirmed by Lucien Blanc [phone]) on 02/21/2024 4:43:02 PM   Echo 12/30/23 Sonographer Comments: Image acquisition challenging due to respiratory  motion and Image acquisition challenging due to patient body habitus.  IMPRESSIONS     1. Left ventricular ejection fraction, by estimation, is 60 to 65%. The  left ventricle has normal function. The left ventricle has no regional  wall motion abnormalities. There is mild left ventricular hypertrophy.  Left ventricular diastolic parameters  are indeterminate.   2. Right ventricular systolic function was not well visualized. The right  ventricular size is not well visualized. There is  moderately elevated  pulmonary artery systolic pressure.   3. Left atrial size was moderately dilated.   4. The mitral valve is normal in  structure. Trivial mitral valve  regurgitation. No evidence of mitral stenosis.   5. The tricuspid valve is abnormal. Tricuspid valve regurgitation is  moderate.   6. The aortic valve is tricuspid. There is mild calcification of the  aortic valve. There is mild thickening of the aortic valve. Aortic valve  regurgitation is not visualized. No aortic stenosis is present.   7. The inferior vena cava is dilated in size with >50% respiratory  variability, suggesting right atrial pressure of 8 mmHg.   FINDINGS   Left Ventricle: Left ventricular ejection fraction, by estimation, is 60  to 65%. The left ventricle has normal function. The left ventricle has no  regional wall motion abnormalities. Definity  contrast agent was given IV  to delineate the left ventricular   endocardial borders. The left ventricular internal cavity size was normal  in size. There is mild left ventricular hypertrophy. Left ventricular  diastolic parameters are indeterminate.   Right Ventricle: RV not well visualized. Grossly appears normal in size  and function. The right ventricular size is not well visualized. Right  vetricular wall thickness was not well visualized. Right ventricular  systolic function was not well visualized.   There is moderately elevated pulmonary artery systolic pressure. The  tricuspid regurgitant velocity is 3.56 m/s, and with an assumed right  atrial pressure of 8 mmHg, the estimated right ventricular systolic  pressure is 58.7 mmHg.   Left Atrium: Left atrial size was moderately dilated.   Right Atrium: Right atrial size was normal in size.   Pericardium: There is no evidence of pericardial effusion.   Mitral Valve: The mitral valve is normal in structure. There is mild  thickening of the mitral valve leaflet(s). There is mild calcification of  the mitral valve leaflet(s). Mild mitral annular calcification. Trivial  mitral valve regurgitation. No evidence   of mitral valve stenosis.    Tricuspid Valve: The tricuspid valve is abnormal. Tricuspid valve  regurgitation is moderate . No evidence of tricuspid stenosis.   Aortic Valve: The aortic valve is tricuspid. There is mild calcification  of the aortic valve. There is mild thickening of the aortic valve. There  is mild aortic valve annular calcification. Aortic valve regurgitation is  not visualized. No aortic stenosis   is present. Aortic valve mean gradient measures 2.9 mmHg. Aortic valve  peak gradient measures 7.3 mmHg. Aortic valve area, by VTI measures 2.09  cm.   Pulmonic Valve: The pulmonic valve was not well visualized. Pulmonic valve  regurgitation is not visualized. No evidence of pulmonic stenosis.   Aorta: The aortic root and ascending aorta are structurally normal, with  no evidence of dilitation.   Venous: The inferior vena cava is dilated in size with greater than 50%  respiratory variability, suggesting right atrial pressure of 8 mmHg.   IAS/Shunts: The interatrial septum was not well visualized.   Risk Assessment/Calculations  CHA2DS2-VASc Score = 4  This indicates a 4.8% annual risk of stroke. The patient's score is based upon: CHF History: 0 HTN History: 1 Diabetes History: 0 Stroke History: 0 Vascular Disease History: 0 Age Score: 2 Gender Score: 1   Physical Exam VS:  BP 136/86   Pulse 82   Ht 5' 1 (1.549 m)   Wt 218 lb 9.6 oz (99.2 kg)   SpO2 96%   BMI 41.30 kg/m  Wt Readings from Last 3 Encounters:  02/21/24 218 lb 9.6 oz (99.2 kg)  01/18/24 209 lb (94.8 kg)  01/10/24 220 lb 7.4 oz (100 kg)    GEN: Well nourished, well developed in no acute distress NECK: No JVD; No carotid bruits CARDIAC: IRIR, no murmurs, rubs, gallops RESPIRATORY:  Clear to auscultation without rales, wheezing or rhonchi  ABDOMEN: Soft, non-tender, non-distended EXTREMITIES:  No edema; No deformity   ASSESSMENT AND PLAN  Persistent Atrial fibrillation Chronic atrial fibrillation  exacerbating symptoms and causing blood pressure fluctuations. - Plan cardioversion to restore normal sinus rhythm. - Ensure uninterrupted anticoagulation therapy before cardioversion.  Hypertension Hypertension managed with amlodipine  and PRN hydralazine , affected by atrial fibrillation. - Maintain current antihypertensive regimen until post-cardioversion. - Consider increasing amlodipine  dose if hypertension persists after cardioversion.  Peripheral edema Significant peripheral edema likely due to fluid retention, exacerbated by atrial fibrillation. - Initiate Lasix  for three days, then as needed. - Prescribe potassium supplement with Lasix . -recent BMP reviewed with normal kidney function - Monitor for improvement in edema and adjust treatment if necessary.  Constipation Recent onset constipation possibly related to medication or inadequate hydration. - Ensure adequate hydration. - Consider stool softeners if constipation persists.  Skin cancer Scheduled for removal on July 29th, contingent on uninterrupted anticoagulation therapy. - Proceed with skin cancer removal if anticoagulation is uninterrupted. - Delay procedure if anticoagulation must be stopped.      Dispo: Barbara Soto can follow-up in 6 months with Dr. Loni  Signed, Orren LOISE Fabry, PA-C

## 2024-02-19 NOTE — Progress Notes (Unsigned)
 Cardiology Office Note   Date:  02/21/2024  ID:  Barbara Soto, DOB 08/04/1940, MRN [id number] PCP: Valentin Skates, DO   HeartCare Providers Cardiologist:  Soyla DELENA Merck, MD   History of Present Illness Barbara Soto is a 83 y.o. female with a past medical history significant for recurrent syncope due to malignant vasovagal syndrome status post Biotronik PPM, persistent atrial fibrillation, CKD here for follow-up appointment.  History includes admission in May 2025 for malignant vasovagal syncope now status post PPM.  Accompanied by her daughter at the last visit January 18, 2024.  Reportedly had done well posthospitalization and had not had any further recurrence of syncope.  She was working with PT and doing very well functionally with her exercise regimen.  Reportedly her blood pressure had been persistently in the 160s.  She had mild chest pain that only started after she got chest compressions which is mild.  Tender to palpation.  Compliant with all medications and tolerating.    Today, she presents with atrial fibrillation who presents with uncontrolled blood pressure and swelling. She is accompanied by her daughter.  She experiences significant and persistent swelling in her feet and legs. No diuretics have been prescribed despite previous consultations. Her daughter is concerned about the swelling's impact on her heart rhythm, especially with an upcoming cardioversion scheduled for August 1st.  Her blood pressure is difficult to control, with recent readings as high as 190/112 mmHg, leading to the rescheduling of procedures. She is on metoprolol  twice daily, amlodipine  2.5 mg once daily, and hydralazine  as needed. Her blood pressure fluctuates significantly, sometimes dropping very low, which has previously led to her heart stopping and requiring CPR. Ultimately ended up a PPM.   She experiences slurred speech, which has been more noticeable in the past week, and feels  exhausted and sluggish, attributing these symptoms to her atrial fibrillation. No weakness or facial droop. Her daughter recalls an incident where her blood pressure dropped significantly, leading to her heart stopping, which was a factor in the decision to implant a pacemaker. She feels weak and unable to walk properly, requiring assistance at times.  Reports no shortness of breath nor dyspnea on exertion. Reports no chest pain, pressure, or tightness. No orthopnea, PND.  Discussed the use of AI scribe software for clinical note transcription with the patient, who gave verbal consent to proceed.   ROS: pertinent ROS in HPI  Studies Reviewed EKG Interpretation Date/Time:  Wednesday February 21 2024 14:47:19 EDT Ventricular Rate:  82 PR Interval:    QRS Duration:  94 QT Interval:  348 QTC Calculation: 406 R Axis:   -61  Text Interpretation: Atrial fibrillation with a competing junctional pacemaker Left axis deviation Low voltage QRS Septal infarct (cited on or before 18-Jan-2024) Inferior infarct (cited on or before 18-Jan-2024) When compared with ECG of 18-Jan-2024 16:19, Questionable change in initial forces of Lateral leads Confirmed by Lucien Blanc [phone]) on 02/21/2024 4:43:02 PM   Echo 12/30/23 Sonographer Comments: Image acquisition challenging due to respiratory  motion and Image acquisition challenging due to patient body habitus.  IMPRESSIONS     1. Left ventricular ejection fraction, by estimation, is 60 to 65%. The  left ventricle has normal function. The left ventricle has no regional  wall motion abnormalities. There is mild left ventricular hypertrophy.  Left ventricular diastolic parameters  are indeterminate.   2. Right ventricular systolic function was not well visualized. The right  ventricular size is not well visualized. There is  moderately elevated  pulmonary artery systolic pressure.   3. Left atrial size was moderately dilated.   4. The mitral valve is normal in  structure. Trivial mitral valve  regurgitation. No evidence of mitral stenosis.   5. The tricuspid valve is abnormal. Tricuspid valve regurgitation is  moderate.   6. The aortic valve is tricuspid. There is mild calcification of the  aortic valve. There is mild thickening of the aortic valve. Aortic valve  regurgitation is not visualized. No aortic stenosis is present.   7. The inferior vena cava is dilated in size with >50% respiratory  variability, suggesting right atrial pressure of 8 mmHg.   FINDINGS   Left Ventricle: Left ventricular ejection fraction, by estimation, is 60  to 65%. The left ventricle has normal function. The left ventricle has no  regional wall motion abnormalities. Definity  contrast agent was given IV  to delineate the left ventricular   endocardial borders. The left ventricular internal cavity size was normal  in size. There is mild left ventricular hypertrophy. Left ventricular  diastolic parameters are indeterminate.   Right Ventricle: RV not well visualized. Grossly appears normal in size  and function. The right ventricular size is not well visualized. Right  vetricular wall thickness was not well visualized. Right ventricular  systolic function was not well visualized.   There is moderately elevated pulmonary artery systolic pressure. The  tricuspid regurgitant velocity is 3.56 m/s, and with an assumed right  atrial pressure of 8 mmHg, the estimated right ventricular systolic  pressure is 58.7 mmHg.   Left Atrium: Left atrial size was moderately dilated.   Right Atrium: Right atrial size was normal in size.   Pericardium: There is no evidence of pericardial effusion.   Mitral Valve: The mitral valve is normal in structure. There is mild  thickening of the mitral valve leaflet(s). There is mild calcification of  the mitral valve leaflet(s). Mild mitral annular calcification. Trivial  mitral valve regurgitation. No evidence   of mitral valve stenosis.    Tricuspid Valve: The tricuspid valve is abnormal. Tricuspid valve  regurgitation is moderate . No evidence of tricuspid stenosis.   Aortic Valve: The aortic valve is tricuspid. There is mild calcification  of the aortic valve. There is mild thickening of the aortic valve. There  is mild aortic valve annular calcification. Aortic valve regurgitation is  not visualized. No aortic stenosis   is present. Aortic valve mean gradient measures 2.9 mmHg. Aortic valve  peak gradient measures 7.3 mmHg. Aortic valve area, by VTI measures 2.09  cm.   Pulmonic Valve: The pulmonic valve was not well visualized. Pulmonic valve  regurgitation is not visualized. No evidence of pulmonic stenosis.   Aorta: The aortic root and ascending aorta are structurally normal, with  no evidence of dilitation.   Venous: The inferior vena cava is dilated in size with greater than 50%  respiratory variability, suggesting right atrial pressure of 8 mmHg.   IAS/Shunts: The interatrial septum was not well visualized.   Risk Assessment/Calculations  CHA2DS2-VASc Score = 4  This indicates a 4.8% annual risk of stroke. The patient's score is based upon: CHF History: 0 HTN History: 1 Diabetes History: 0 Stroke History: 0 Vascular Disease History: 0 Age Score: 2 Gender Score: 1   Physical Exam VS:  BP 136/86   Pulse 82   Ht 5' 1 (1.549 m)   Wt 218 lb 9.6 oz (99.2 kg)   SpO2 96%   BMI 41.30 kg/m  Wt Readings from Last 3 Encounters:  02/21/24 218 lb 9.6 oz (99.2 kg)  01/18/24 209 lb (94.8 kg)  01/10/24 220 lb 7.4 oz (100 kg)    GEN: Well nourished, well developed in no acute distress NECK: No JVD; No carotid bruits CARDIAC: IRIR, no murmurs, rubs, gallops RESPIRATORY:  Clear to auscultation without rales, wheezing or rhonchi  ABDOMEN: Soft, non-tender, non-distended EXTREMITIES:  No edema; No deformity   ASSESSMENT AND PLAN  Persistent Atrial fibrillation Chronic atrial fibrillation  exacerbating symptoms and causing blood pressure fluctuations. - Plan cardioversion to restore normal sinus rhythm. - Ensure uninterrupted anticoagulation therapy before cardioversion.  Hypertension Hypertension managed with amlodipine  and PRN hydralazine , affected by atrial fibrillation. - Maintain current antihypertensive regimen until post-cardioversion. - Consider increasing amlodipine  dose if hypertension persists after cardioversion.  Peripheral edema Significant peripheral edema likely due to fluid retention, exacerbated by atrial fibrillation. - Initiate Lasix  for three days, then as needed. - Prescribe potassium supplement with Lasix . -recent BMP reviewed with normal kidney function - Monitor for improvement in edema and adjust treatment if necessary.  Constipation Recent onset constipation possibly related to medication or inadequate hydration. - Ensure adequate hydration. - Consider stool softeners if constipation persists.  Skin cancer Scheduled for removal on July 29th, contingent on uninterrupted anticoagulation therapy. - Proceed with skin cancer removal if anticoagulation is uninterrupted. - Delay procedure if anticoagulation must be stopped.      Dispo: She can follow-up in 6 months with Dr. Loni  Signed, Orren LOISE Fabry, PA-C

## 2024-02-21 ENCOUNTER — Other Ambulatory Visit (HOSPITAL_COMMUNITY): Payer: Self-pay

## 2024-02-21 ENCOUNTER — Ambulatory Visit: Attending: Physician Assistant | Admitting: Physician Assistant

## 2024-02-21 ENCOUNTER — Encounter: Payer: Self-pay | Admitting: Physician Assistant

## 2024-02-21 VITALS — BP 136/86 | HR 82 | Ht 61.0 in | Wt 218.6 lb

## 2024-02-21 DIAGNOSIS — I10 Essential (primary) hypertension: Secondary | ICD-10-CM

## 2024-02-21 DIAGNOSIS — R55 Syncope and collapse: Secondary | ICD-10-CM

## 2024-02-21 DIAGNOSIS — I482 Chronic atrial fibrillation, unspecified: Secondary | ICD-10-CM

## 2024-02-21 DIAGNOSIS — I4819 Other persistent atrial fibrillation: Secondary | ICD-10-CM

## 2024-02-21 DIAGNOSIS — D509 Iron deficiency anemia, unspecified: Secondary | ICD-10-CM | POA: Diagnosis not present

## 2024-02-21 MED ORDER — POTASSIUM CHLORIDE CRYS ER 20 MEQ PO TBCR
EXTENDED_RELEASE_TABLET | ORAL | 1 refills | Status: AC
  Filled 2024-02-21: qty 30, 30d supply, fill #0

## 2024-02-21 MED ORDER — FUROSEMIDE 40 MG PO TABS
ORAL_TABLET | ORAL | 1 refills | Status: AC
  Filled 2024-02-21: qty 30, 30d supply, fill #0

## 2024-02-21 NOTE — Patient Instructions (Addendum)
 Medication Instructions:   START LASIX  40 MG BY MOUTH DAILY FOR 3 DAYS ONLY, THEN DECREASE TO TAKING 40 MG BY MOUTH DAILY AS NEEDED FOR LOWER EXTREMITY SWELLING THEREAFTER  START TAKING POTASSIUM CHLORIDE  20 mEq BY MOUTH DAILY FOR 3 DAYS ONLY, THEN DECREASE TO TAKING 20 mEq BY MOUTH DAILY AS NEEDED WHEN YOU ADMINISTER YOUR LASIX  THEREAFTER  *If you need a refill on your cardiac medications before your next appointment, please call your pharmacy*    Follow-Up: At [facility], you and your health needs are our priority.  As part of our continuing mission to provide you with exceptional heart care, our providers are all part of one team.  This team includes your primary Cardiologist (physician) and Advanced Practice Providers or APPs (Physician Assistants and Nurse Practitioners) who all work together to provide you with the care you need, when you need it.  Your next appointment:   6 month(s)  Provider:   Gayatri A Acharya, MD   Other Instructions DO NOT MISS ANY ELIQUIS  DOSING AND PROCEED WITH SCHEDULED CARDIOVERSION

## 2024-02-23 LAB — CUP PACEART REMOTE DEVICE CHECK
Date Time Interrogation Session: 20250715143548
Implantable Lead Connection Status: 753985
Implantable Lead Connection Status: 753985
Implantable Lead Implant Date: 20250602
Implantable Lead Implant Date: 20250602
Implantable Lead Location: 753862
Implantable Lead Location: 753862
Implantable Lead Model: 377
Implantable Lead Model: 377
Implantable Lead Serial Number: 8001899805
Implantable Lead Serial Number: 8001918844
Implantable Pulse Generator Implant Date: 20250602
Pulse Gen Serial Number: 1000510717

## 2024-02-29 ENCOUNTER — Ambulatory Visit: Payer: Self-pay | Admitting: Cardiology

## 2024-02-29 NOTE — Progress Notes (Signed)
 Left VM for patient's daughter, Claris Leatherwood, to return call for instructions for appointment on 03/01/2024.

## 2024-02-29 NOTE — Progress Notes (Signed)
 Spoke to patient's daughter  and instructed them to come at 0700  and to be NPO after 0000.     Confirmed that patient will have a ride home and someone to stay with them for 24 hours after the procedure.   Medications reviewed.  Confirmed blood thinner.  Confirmed no breaks in taking blood thinner for 3+ weeks prior to procedure.

## 2024-03-01 ENCOUNTER — Ambulatory Visit (HOSPITAL_COMMUNITY): Admitting: Anesthesiology

## 2024-03-01 ENCOUNTER — Ambulatory Visit (HOSPITAL_BASED_OUTPATIENT_CLINIC_OR_DEPARTMENT_OTHER): Admitting: Anesthesiology

## 2024-03-01 ENCOUNTER — Ambulatory Visit (HOSPITAL_COMMUNITY)
Admission: RE | Admit: 2024-03-01 | Discharge: 2024-03-01 | Disposition: A | Discharge status: Home / Self Care | Attending: Cardiovascular Disease | Admitting: Cardiovascular Disease

## 2024-03-01 ENCOUNTER — Other Ambulatory Visit: Payer: Self-pay

## 2024-03-01 ENCOUNTER — Encounter (HOSPITAL_COMMUNITY)
Admission: RE | Disposition: A | Discharge status: Home / Self Care | Payer: Self-pay | Source: Home / Self Care | Attending: Cardiovascular Disease

## 2024-03-01 DIAGNOSIS — I129 Hypertensive chronic kidney disease with stage 1 through stage 4 chronic kidney disease, or unspecified chronic kidney disease: Secondary | ICD-10-CM | POA: Diagnosis not present

## 2024-03-01 DIAGNOSIS — I482 Chronic atrial fibrillation, unspecified: Secondary | ICD-10-CM | POA: Diagnosis not present

## 2024-03-01 DIAGNOSIS — K5900 Constipation, unspecified: Secondary | ICD-10-CM | POA: Diagnosis not present

## 2024-03-01 DIAGNOSIS — R55 Syncope and collapse: Secondary | ICD-10-CM

## 2024-03-01 DIAGNOSIS — R600 Localized edema: Secondary | ICD-10-CM | POA: Insufficient documentation

## 2024-03-01 DIAGNOSIS — Z950 Presence of cardiac pacemaker: Secondary | ICD-10-CM | POA: Diagnosis not present

## 2024-03-01 DIAGNOSIS — Z79899 Other long term (current) drug therapy: Secondary | ICD-10-CM | POA: Diagnosis not present

## 2024-03-01 DIAGNOSIS — Z7901 Long term (current) use of anticoagulants: Secondary | ICD-10-CM | POA: Insufficient documentation

## 2024-03-01 DIAGNOSIS — I10 Essential (primary) hypertension: Secondary | ICD-10-CM

## 2024-03-01 DIAGNOSIS — N189 Chronic kidney disease, unspecified: Secondary | ICD-10-CM | POA: Insufficient documentation

## 2024-03-01 DIAGNOSIS — C4490 Unspecified malignant neoplasm of skin, unspecified: Secondary | ICD-10-CM | POA: Insufficient documentation

## 2024-03-01 DIAGNOSIS — I4819 Other persistent atrial fibrillation: Secondary | ICD-10-CM | POA: Diagnosis not present

## 2024-03-01 DIAGNOSIS — I4891 Unspecified atrial fibrillation: Secondary | ICD-10-CM | POA: Diagnosis not present

## 2024-03-01 HISTORY — PX: CARDIOVERSION: EP1203

## 2024-03-01 SURGERY — CARDIOVERSION (CATH LAB)
Anesthesia: General

## 2024-03-01 MED ORDER — PROPOFOL 10 MG/ML IV BOLUS
INTRAVENOUS | Status: DC | PRN
  Administered 2024-03-01: 50 mg via INTRAVENOUS
  Administered 2024-03-01: 20 mg via INTRAVENOUS
  Administered 2024-03-01: 10 mg via INTRAVENOUS

## 2024-03-01 MED ORDER — SODIUM CHLORIDE 0.9 % IV SOLN: INTRAVENOUS | Status: DC

## 2024-03-01 SURGICAL SUPPLY — 1 items: PAD DEFIB RADIO PHYSIO CONN (PAD) ×1 IMPLANT

## 2024-03-01 NOTE — Anesthesia Preprocedure Evaluation (Addendum)
 Anesthesia Evaluation  Patient identified by MRN, date of birth, ID band Patient awake    Reviewed: Allergy & Precautions, NPO status , Patient's Chart, lab work & pertinent test results  History of Anesthesia Complications (+) PONV and history of anesthetic complications  Airway Mallampati: II  TM Distance: >3 FB Neck ROM: Full    Dental  (+) Upper Dentures   Pulmonary neg pulmonary ROS, neg sleep apnea, neg COPD, Patient abstained from smoking.Not current smoker   Pulmonary exam normal breath sounds clear to auscultation       Cardiovascular Exercise Tolerance: Good METShypertension, pulmonary hypertension(-) CAD and (-) Past MI + dysrhythmias Atrial Fibrillation + pacemaker  Rhythm:Irregular Rate:Normal  1. Left ventricular ejection fraction, by estimation, is 60 to 65%. The  left ventricle has normal function. The left ventricle has no regional  wall motion abnormalities. There is mild left ventricular hypertrophy.  Left ventricular diastolic parameters  are indeterminate.   2. Right ventricular systolic function was not well visualized. The right  ventricular size is not well visualized. There is moderately elevated  pulmonary artery systolic pressure.   3. Left atrial size was moderately dilated.   4. The mitral valve is normal in structure. Trivial mitral valve  regurgitation. No evidence of mitral stenosis.   5. The tricuspid valve is abnormal. Tricuspid valve regurgitation is  moderate.   6. The aortic valve is tricuspid. There is mild calcification of the  aortic valve. There is mild thickening of the aortic valve. Aortic valve  regurgitation is not visualized. No aortic stenosis is present.   7. The inferior vena cava is dilated in size with >50% respiratory  variability, suggesting right atrial pressure of 8 mmHg.     Neuro/Psych negative neurological ROS  negative psych ROS   GI/Hepatic negative GI ROS, Neg  liver ROS,neg GERD  ,,  Endo/Other  negative endocrine ROSneg diabetes    Renal/GU negative Renal ROS  negative genitourinary   Musculoskeletal  (+) Arthritis , Osteoarthritis,    Abdominal   Peds negative pediatric ROS (+)  Hematology negative hematology ROS (+)   Anesthesia Other Findings Past Medical History: No date: Complication of anesthesia No date: DJD (degenerative joint disease)     Comment:  right hip No date: Hypertension No date: PONV (postoperative nausea and vomiting) 06/08/2020: Squamous cell carcinoma of skin     Comment:  mod diff/ulcerated-Left parotid area (MOHS) No date: Wears dentures No date: Wears glasses  Reproductive/Obstetrics negative OB ROS                              Anesthesia Physical Anesthesia Plan  ASA: 3  Anesthesia Plan: General   Post-op Pain Management: Minimal or no pain anticipated   Induction: Intravenous  PONV Risk Score and Plan: 3 and Propofol  infusion, TIVA and Ondansetron   Airway Management Planned: Nasal Cannula  Additional Equipment: None  Intra-op Plan:   Post-operative Plan:   Informed Consent: I have reviewed the patients History and Physical, chart, labs and discussed the procedure including the risks, benefits and alternatives for the proposed anesthesia with the patient or authorized representative who has indicated his/her understanding and acceptance.     Dental advisory given  Plan Discussed with: CRNA and Surgeon  Anesthesia Plan Comments: (Discussed risks of anesthesia with patient, including possibility of difficulty with spontaneous ventilation under anesthesia necessitating airway intervention, PONV, and rare risks such as cardiac or respiratory or neurological events,  and allergic reactions. Discussed the role of CRNA in patient's perioperative care. Patient understands.)         Anesthesia Quick Evaluation

## 2024-03-01 NOTE — Op Note (Addendum)
 Procedure: Electrical Cardioversion Indications:  Atrial Fibrillation  Procedure Details:  Consent: Risks of procedure as well as the alternatives and risks of each were explained to the (patient/caregiver).  Consent for procedure obtained.  Time Out: Verified patient identification, verified procedure, site/side was marked, verified correct patient position, special equipment/implants available, medications/allergies/relevent history reviewed, required imaging and test results available.  Performed  Patient placed on cardiac monitor, pulse oximetry, supplemental oxygen as necessary.  Sedation given: Propofol  90 mg IV, Dr. Boone Pacer pads placed anterior and posterior chest.  Cardioverted 1 time(s).  Cardioversion with synchronized biphasic 200J shock.  Evaluation: Findings: Post procedure EKG shows: A paced V sensed with frequent PACs Complications: None Patient did tolerate procedure well.  Temporarily A paced at 90 bpm to help reduce PAC burden. Normal pacemaker and lead function after DCCV shock.  Time Spent Directly with the Patient:    Sailor Haughn 03/01/2024, 8:43 AM

## 2024-03-01 NOTE — Discharge Instructions (Signed)

## 2024-03-01 NOTE — Transfer of Care (Signed)
 Immediate Anesthesia Transfer of Care Note  Patient: Barbara Soto  Procedure(s) Performed: CARDIOVERSION  Patient Location: PACU and Cath Lab  Anesthesia Type:MAC  Level of Consciousness: drowsy  Airway & Oxygen Therapy: Patient Spontanous Breathing and Patient connected to nasal cannula oxygen  Post-op Assessment: Report given to RN and Post -op Vital signs reviewed and stable  Post vital signs: Reviewed and stable  Last Vitals:  Vitals Value Taken Time  BP    Temp    Pulse 86 03/01/24 08:25  Resp 24 03/01/24 08:25  SpO2 95 % 03/01/24 08:25  Vitals shown include unfiled device data.  Last Pain:  Vitals:   03/01/24 0724  TempSrc: Temporal  PainSc: 0-No pain         Complications: No notable events documented.

## 2024-03-01 NOTE — Anesthesia Postprocedure Evaluation (Signed)
 Anesthesia Post Note  Patient: Barbara Soto  Procedure(s) Performed: CARDIOVERSION     Patient location during evaluation: Cath Lab Anesthesia Type: General Level of consciousness: awake and alert Pain management: pain level controlled Vital Signs Assessment: post-procedure vital signs reviewed and stable Respiratory status: spontaneous breathing, nonlabored ventilation, respiratory function stable and patient connected to nasal cannula oxygen Cardiovascular status: blood pressure returned to baseline and stable Postop Assessment: no apparent nausea or vomiting Anesthetic complications: no   No notable events documented.  Last Vitals:  Vitals:   03/01/24 0900 03/01/24 0905  BP: 136/73 (!) 135/96  Pulse: 60 67  Resp: (!) 24 (!) 23  Temp:  36.9 C  SpO2: 95% 96%    Last Pain:  Vitals:   03/01/24 0905  TempSrc: Temporal  PainSc: 0-No pain                 Rome Ade

## 2024-03-01 NOTE — Interval H&P Note (Signed)
 History and Physical Interval Note:  03/01/2024 8:08 AM  [name]  has presented today for surgery, with the diagnosis of AFIB.  The various methods of treatment have been discussed with the patient and family. After consideration of risks, benefits and other options for treatment, the patient has consented to  Procedure(s): CARDIOVERSION (N/A) as a surgical intervention.  The patient's history has been reviewed, patient examined, no change in status, stable for surgery.  I have reviewed the patient's chart and labs.  Questions were answered to the patient's satisfaction.     Barbara Soto

## 2024-03-02 ENCOUNTER — Encounter (HOSPITAL_COMMUNITY): Payer: Self-pay | Admitting: Cardiovascular Disease

## 2024-03-12 ENCOUNTER — Observation Stay (HOSPITAL_COMMUNITY)
Admission: EM | Admit: 2024-03-12 | Discharge: 2024-03-13 | Disposition: A | Discharge status: Home / Self Care | Attending: Family Medicine | Admitting: Family Medicine

## 2024-03-12 ENCOUNTER — Other Ambulatory Visit: Payer: Self-pay

## 2024-03-12 ENCOUNTER — Telehealth: Payer: Self-pay | Admitting: Cardiology

## 2024-03-12 ENCOUNTER — Emergency Department (HOSPITAL_COMMUNITY)

## 2024-03-12 ENCOUNTER — Encounter (HOSPITAL_COMMUNITY): Payer: Self-pay | Admitting: Emergency Medicine

## 2024-03-12 DIAGNOSIS — I517 Cardiomegaly: Secondary | ICD-10-CM | POA: Diagnosis not present

## 2024-03-12 DIAGNOSIS — Z79899 Other long term (current) drug therapy: Secondary | ICD-10-CM | POA: Diagnosis not present

## 2024-03-12 DIAGNOSIS — R0602 Shortness of breath: Secondary | ICD-10-CM | POA: Insufficient documentation

## 2024-03-12 DIAGNOSIS — I10 Essential (primary) hypertension: Secondary | ICD-10-CM | POA: Insufficient documentation

## 2024-03-12 DIAGNOSIS — R519 Headache, unspecified: Secondary | ICD-10-CM | POA: Diagnosis not present

## 2024-03-12 DIAGNOSIS — Z7982 Long term (current) use of aspirin: Secondary | ICD-10-CM | POA: Diagnosis not present

## 2024-03-12 DIAGNOSIS — Z950 Presence of cardiac pacemaker: Secondary | ICD-10-CM | POA: Diagnosis not present

## 2024-03-12 DIAGNOSIS — G9340 Encephalopathy, unspecified: Principal | ICD-10-CM | POA: Diagnosis present

## 2024-03-12 DIAGNOSIS — Z7901 Long term (current) use of anticoagulants: Secondary | ICD-10-CM | POA: Diagnosis not present

## 2024-03-12 DIAGNOSIS — I771 Stricture of artery: Secondary | ICD-10-CM | POA: Diagnosis not present

## 2024-03-12 DIAGNOSIS — I6523 Occlusion and stenosis of bilateral carotid arteries: Secondary | ICD-10-CM | POA: Diagnosis not present

## 2024-03-12 DIAGNOSIS — I4891 Unspecified atrial fibrillation: Secondary | ICD-10-CM | POA: Diagnosis not present

## 2024-03-12 DIAGNOSIS — R4182 Altered mental status, unspecified: Secondary | ICD-10-CM | POA: Diagnosis not present

## 2024-03-12 DIAGNOSIS — R4781 Slurred speech: Secondary | ICD-10-CM | POA: Diagnosis not present

## 2024-03-12 DIAGNOSIS — G9389 Other specified disorders of brain: Secondary | ICD-10-CM | POA: Diagnosis not present

## 2024-03-12 DIAGNOSIS — I6782 Cerebral ischemia: Secondary | ICD-10-CM | POA: Diagnosis not present

## 2024-03-12 LAB — DIFFERENTIAL
Abs Immature Granulocytes: 0.02 K/uL (ref 0.00–0.07)
Basophils Absolute: 0 K/uL (ref 0.0–0.1)
Basophils Relative: 1 %
Eosinophils Absolute: 0.2 K/uL (ref 0.0–0.5)
Eosinophils Relative: 3 %
Immature Granulocytes: 0 %
Lymphocytes Relative: 19 %
Lymphs Abs: 1 K/uL (ref 0.7–4.0)
Monocytes Absolute: 0.4 K/uL (ref 0.1–1.0)
Monocytes Relative: 7 %
Neutro Abs: 3.8 K/uL (ref 1.7–7.7)
Neutrophils Relative %: 70 %

## 2024-03-12 LAB — I-STAT CHEM 8, ED
BUN: 12 mg/dL (ref 8–23)
Calcium, Ion: 0.97 mmol/L — ABNORMAL LOW (ref 1.15–1.40)
Chloride: 102 mmol/L (ref 98–111)
Creatinine, Ser: 0.8 mg/dL (ref 0.44–1.00)
Glucose, Bld: 104 mg/dL — ABNORMAL HIGH (ref 70–99)
HCT: 35 % — ABNORMAL LOW (ref 36.0–46.0)
Hemoglobin: 11.9 g/dL — ABNORMAL LOW (ref 12.0–15.0)
Potassium: 3.7 mmol/L (ref 3.5–5.1)
Sodium: 138 mmol/L (ref 135–145)
TCO2: 28 mmol/L (ref 22–32)

## 2024-03-12 LAB — CBG MONITORING, ED: Glucose-Capillary: 99 mg/dL (ref 70–99)

## 2024-03-12 LAB — ETHANOL: Alcohol, Ethyl (B): <15 mg/dL (ref <15)

## 2024-03-12 LAB — COMPREHENSIVE METABOLIC PANEL WITH GFR
ALT: 11 U/L (ref 0–44)
AST: 26 U/L (ref 15–41)
Albumin: 3.8 g/dL (ref 3.5–5.0)
Alkaline Phosphatase: 98 U/L (ref 38–126)
Anion gap: 10 (ref 5–15)
BUN: 10 mg/dL (ref 8–23)
CO2: 27 mmol/L (ref 22–32)
Calcium: 8.8 mg/dL — ABNORMAL LOW (ref 8.9–10.3)
Chloride: 101 mmol/L (ref 98–111)
Creatinine, Ser: 0.87 mg/dL (ref 0.44–1.00)
GFR, Estimated: >60 mL/min (ref >60)
Glucose, Bld: 102 mg/dL — ABNORMAL HIGH (ref 70–99)
Potassium: 3.6 mmol/L (ref 3.5–5.1)
Sodium: 138 mmol/L (ref 135–145)
Total Bilirubin: 2 mg/dL — ABNORMAL HIGH (ref 0.0–1.2)
Total Protein: 7.3 g/dL (ref 6.5–8.1)

## 2024-03-12 LAB — CBC
HCT: 36.3 % (ref 36.0–46.0)
Hemoglobin: 11.4 g/dL — ABNORMAL LOW (ref 12.0–15.0)
MCH: 28.1 pg (ref 26.0–34.0)
MCHC: 31.4 g/dL (ref 30.0–36.0)
MCV: 89.4 fL (ref 80.0–100.0)
Platelets: 173 K/uL (ref 150–400)
RBC: 4.06 MIL/uL (ref 3.87–5.11)
RDW: 15.2 % (ref 11.5–15.5)
WBC: 5.4 K/uL (ref 4.0–10.5)
nRBC: 0 % (ref 0.0–0.2)

## 2024-03-12 LAB — BRAIN NATRIURETIC PEPTIDE: B Natriuretic Peptide: 202.9 pg/mL — ABNORMAL HIGH (ref 0.0–100.0)

## 2024-03-12 LAB — PROTIME-INR
INR: 1.7 — ABNORMAL HIGH (ref 0.8–1.2)
Prothrombin Time: 20.9 s — ABNORMAL HIGH (ref 11.4–15.2)

## 2024-03-12 LAB — APTT: aPTT: 37 s — ABNORMAL HIGH (ref 24–36)

## 2024-03-12 MED ORDER — SODIUM CHLORIDE 0.9% FLUSH
3.0000 mL | Freq: Once | INTRAVENOUS | Status: AC
  Administered 2024-03-13 (×2): 3 mL via INTRAVENOUS

## 2024-03-12 NOTE — ED Triage Notes (Signed)
 Patient arrives with complaints of slurred speech and hypertension. Family states that cardiologist  wanted her to be evaluated in the ED. Family reports that slurred speech has been going on for the past 4-5 days.

## 2024-03-12 NOTE — Telephone Encounter (Signed)
  Patient c/o Palpitations: STAT if patient c/o lightheadedness, shortness of breath, or chest pain  How long have you had palpitations/irregular HR/ Afib? Are you having the symptoms now? Symptoms started last week, patient had cardioversion on 03/01/24  Are you currently experiencing lightheadedness, SOB or CP? Not sure, daughter is calling  Do you have a history of afib (atrial fibrillation) or irregular heart rhythm? yes  Have you checked your BP or HR? (document readings if available):  Daughter says that her diastolic number is running around the 90's to 108 and systolic number around 140-150's  Are you experiencing any other symptoms? Slurring speech, speaking slow, fatigue

## 2024-03-12 NOTE — ED Provider Triage Note (Signed)
 Emergency Medicine Provider Triage Evaluation Note  SURIYAH VERGARA , a 83 y.o. female  was evaluated in triage.  Pt complains of intermittent slurred speech this month. Patient reports no complains. Hx of afib and no missed doses of blood thinner. Some lower leg swelling, taking lasix . Not within window.  Review of Systems  Positive: Slurred speech, leg swelling Negative:   Physical Exam  BP (!) 188/83 (BP Location: Left Arm)   Pulse 73   Temp 97.9 F (36.6 C)   Resp (!) 24   Wt 99.2 kg   SpO2 98%   BMI 41.30 kg/m  Gen:   Awake, no distress   Resp:  Normal effort  MSK:   Moves extremities without difficulty  Other:  Moves all 4 limbs spontaneously, CN II through XII grossly intact, can ambulate without difficulty, intact sensation throughout.  Medical Decision Making  Medically screening exam initiated at 8:17 PM.  Appropriate orders placed.  [name] was informed that the remainder of the evaluation will be completed by another provider, this initial triage assessment does not replace that evaluation, and the importance of remaining in the ED until their evaluation is complete.  Workup initiated in triage Not within stroke window and no focal deficits noted    Rosan Sherlean DEL, PA-C 03/12/24 2017

## 2024-03-12 NOTE — Telephone Encounter (Signed)
 Spoke with pt regarding her blood pressures. Pt was speaking very slowly and slurring her words. Pt explained she is upset with the medications prescribed at the hospital stating she does not like to take medication and they don't work anyway. Received verbal consent from pt to speak with son. Son stated the medications are causing her the sound drunk and that it happens every time she takes her medications. Son stated I should call pt's daughter for more information. Received verbal consent from pt to call daughter. Daughter stated the pt has had very high blood pressures at times, stating has been as high as 197/100. Daughter stated the hydralazine  given to them at the hospital works well. Daughter stated the speech slurring stated last week but is worse today. Daughter stated the pt did very well for a few days after her DCCV and then became fatigued again and started sounding like she's under the influence. Daughter was advised to take the patient to the emergency room to be evaluated. Daughter verbalized understanding. All questions if any were answered.

## 2024-03-12 NOTE — ED Triage Notes (Addendum)
 Pt in with daughter who states pt has had periods of slurred speech throughout the month. States it has worsened today though - noticed this while on the phone w/her at 3:15pm, states her mentation is normal and no confusion reported. Hx of afib and recent pacemaker placement - takes Eliquis  BID, no missed doses (last dose 6:45pm). Daughter reports HTN today. Also notices recent BLE swelling, has been given Lasix  by PCP for this

## 2024-03-13 ENCOUNTER — Emergency Department (HOSPITAL_COMMUNITY)

## 2024-03-13 ENCOUNTER — Other Ambulatory Visit: Payer: Self-pay

## 2024-03-13 DIAGNOSIS — I517 Cardiomegaly: Secondary | ICD-10-CM | POA: Diagnosis not present

## 2024-03-13 DIAGNOSIS — I6523 Occlusion and stenosis of bilateral carotid arteries: Secondary | ICD-10-CM | POA: Diagnosis not present

## 2024-03-13 DIAGNOSIS — G9340 Encephalopathy, unspecified: Secondary | ICD-10-CM | POA: Diagnosis present

## 2024-03-13 DIAGNOSIS — I771 Stricture of artery: Secondary | ICD-10-CM | POA: Diagnosis not present

## 2024-03-13 DIAGNOSIS — I10 Essential (primary) hypertension: Secondary | ICD-10-CM | POA: Diagnosis not present

## 2024-03-13 DIAGNOSIS — R4182 Altered mental status, unspecified: Secondary | ICD-10-CM | POA: Diagnosis not present

## 2024-03-13 LAB — URINALYSIS, ROUTINE W REFLEX MICROSCOPIC
Bilirubin Urine: NEGATIVE
Glucose, UA: NEGATIVE mg/dL
Hgb urine dipstick: NEGATIVE
Ketones, ur: NEGATIVE mg/dL
Nitrite: NEGATIVE
Protein, ur: NEGATIVE mg/dL
Specific Gravity, Urine: 1.021 (ref 1.005–1.030)
pH: 8 (ref 5.0–8.0)

## 2024-03-13 LAB — TSH: TSH: 3.097 u[IU]/mL (ref 0.350–4.500)

## 2024-03-13 LAB — TROPONIN I (HIGH SENSITIVITY): Troponin I (High Sensitivity): 11 ng/L (ref <18)

## 2024-03-13 MED ORDER — IOHEXOL 350 MG/ML SOLN
75.0000 mL | Freq: Once | INTRAVENOUS | Status: AC | PRN
  Administered 2024-03-13 (×2): 75 mL via INTRAVENOUS

## 2024-03-13 NOTE — ED Notes (Signed)
 Pt able to sit in wheelchair in order to go to the restroom. Pt returned to room and place on monitor. Pt then disconnected herself and stated for this NT to call the nurse and take her IV out so she can go home. RN and ED MD aware.

## 2024-03-13 NOTE — Discharge Instructions (Signed)
 You were AGAINST MEDICAL ADVICE.  Stroke has not been completely ruled out.  It is uncertain what is causing your slurred speech currently and further workup was recommended.

## 2024-03-13 NOTE — ED Notes (Signed)
 Patient transported to CT

## 2024-03-13 NOTE — ED Provider Notes (Addendum)
 [place] EMERGENCY DEPARTMENT AT [facility] Provider Note   CSN: [id number] Arrival date & time: 03/12/24  1952     Patient presents with: Aphasia and Hypertension   Barbara Soto is a 83 y.o. female.   Patient with a history of hypertension, atrial fibrillation on Eliquis , recent pacemaker implantation here with slurred speech.  Symptoms started about 3 PM today and have since resolved.  Daughter at bedside reports she had severe slurred speech and sound like she was drunk.  Slurred speech lasted for several hours and is now resolved.  She feels back to baseline currently.  Has had intermittent episodes of slurred speech over the past 1 week that is associated with elevated blood pressure.  This resolves when she takes her hydralazine  at home.  She denies any focal weakness.  No numbness or tingling.  No chest pain or shortness of breath.  No headache.  No visual changes.  No room spinning dizziness or lightheadedness.  Reports she feels back to baseline now and her speech is normal.  Denies fever, chills, nausea or vomiting.  The history is provided by the patient and a relative.  Hypertension Pertinent negatives include no abdominal pain, no headaches and no shortness of breath.       Prior to Admission medications   Medication Sig Start Date End Date Taking? Authorizing Provider  amLODipine  (NORVASC ) 2.5 MG tablet Take 1 tablet (2.5 mg total) by mouth daily. 01/18/24 04/17/24  Darryle Thom CROME, PA-C  apixaban  (ELIQUIS ) 5 MG TABS tablet Take 1 tablet (5 mg total) by mouth 2 (two) times daily. 02/07/24   Acharya, Gayatri A, MD  aspirin  EC 81 MG tablet Take 81 mg by mouth daily. Swallow whole. Patient not taking: Reported on 01/18/2024    [provider]  diazepam  (VALIUM ) 2 MG tablet Take 1 tablet (2 mg total) by mouth every 12 (twelve) hours as needed for anxiety. 09/01/20   Levora Reyes SAUNDERS, MD  furosemide  (LASIX ) 40 MG tablet Take 1 tablet (40 mg total) by mouth  daily for 3 days, then decrease 1 tablet (40 mg total) by mouth daily as needed thereafter. 02/21/24   Lucien Orren SAILOR, PA-C  gabapentin  (NEURONTIN ) 100 MG capsule Take 1 capsule (100 mg total) by mouth 3 (three) times daily. Patient not taking: Reported on 02/21/2024 12/30/20   Janit Thresa HERO, DPM  hydrALAZINE  (APRESOLINE ) 25 MG tablet Take 1 tablet (25 mg total) by mouth 2 (two) times daily as needed (SBP>170). 01/10/24 01/09/25  Dorinda Drue DASEN, MD  lidocaine  (LIDODERM ) 5 % Place 1 patch onto the skin daily. Remove & Discard patch within 12 hours or as directed by MD 01/04/24   Trixie Nilda HERO, MD  metoprolol  succinate (TOPROL -XL) 25 MG 24 hr tablet Take 1 tablet (25 mg total) by mouth 2 (two) times daily. 02/07/24   Darryle Thom CROME, PA-C  oxyCODONE  (OXY IR/ROXICODONE ) 5 MG immediate release tablet Take 1 tablet (5 mg total) by mouth every 6 (six) hours as needed for severe pain (pain score 7-10). Patient not taking: Reported on 02/21/2024 01/04/24   Gherghe, Costin M, MD  potassium chloride  SA (KLOR-CON  M) 20 MEQ tablet Take 1 tablet (20 MEQ total) by mouth daily for 3 days, then decrease to taking 1 tablet (20 MEQ total) by mouth daily as needed with Lasix  administration 02/21/24   Conte, Tessa N, PA-C  sertraline  (ZOLOFT ) 25 MG tablet Take 1 tablet (25 mg total) by mouth daily. 09/10/20  Levora Reyes SAUNDERS, MD  sertraline  (ZOLOFT ) 25 MG tablet Take 25 mg by mouth daily. 12/23/23   [provider]    Allergies: Adhesive [tape], Penicillins, Latex, Latex, and Penicillins    Review of Systems  Constitutional:  Negative for activity change, appetite change and fever.  HENT:  Negative for congestion.   Respiratory:  Negative for cough, chest tightness and shortness of breath.   Gastrointestinal:  Negative for abdominal pain, nausea and vomiting.  Genitourinary:  Negative for dysuria and hematuria.  Musculoskeletal:  Negative for arthralgias and myalgias.  Skin:  Negative for rash.  Neurological:   Positive for speech difficulty. Negative for dizziness, light-headedness and headaches.   all other systems are negative except as noted in the HPI and PMH.    Updated Vital Signs BP (!) 177/69 (BP Location: Right Arm)   Pulse 65   Temp 98 F (36.7 C)   Resp 16   Wt 99.2 kg   SpO2 96%   BMI 41.30 kg/m   Physical Exam Vitals and nursing note reviewed.  Constitutional:      General: She is not in acute distress.    Appearance: She is well-developed.  HENT:     Head: Normocephalic and atraumatic.     Mouth/Throat:     Pharynx: No oropharyngeal exudate.  Eyes:     Conjunctiva/sclera: Conjunctivae normal.     Pupils: Pupils are equal, round, and reactive to light.  Neck:     Comments: No meningismus. Cardiovascular:     Rate and Rhythm: Normal rate and regular rhythm.     Heart sounds: Normal heart sounds. No murmur heard. Pulmonary:     Effort: Pulmonary effort is normal. No respiratory distress.     Breath sounds: Normal breath sounds.  Abdominal:     Palpations: Abdomen is soft.     Tenderness: There is no abdominal tenderness. There is no guarding or rebound.  Musculoskeletal:        General: No tenderness. Normal range of motion.     Cervical back: Normal range of motion and neck supple.  Skin:    General: Skin is warm.  Neurological:     Mental Status: She is alert and oriented to person, place, and time.     Cranial Nerves: No cranial nerve deficit.     Motor: No abnormal muscle tone.     Coordination: Coordination normal.     Comments:  5/5 strength throughout. CN 2-12 intact.Equal grip strength.   Psychiatric:        Behavior: Behavior normal.     (all labs ordered are listed, but only abnormal results are displayed) Labs Reviewed  PROTIME-INR - Abnormal; Notable for the following components:      Result Value   Prothrombin Time 20.9 (*)    INR 1.7 (*)    All other components within normal limits  APTT - Abnormal; Notable for the following components:    aPTT 37 (*)    All other components within normal limits  CBC - Abnormal; Notable for the following components:   Hemoglobin 11.4 (*)    All other components within normal limits  COMPREHENSIVE METABOLIC PANEL WITH GFR - Abnormal; Notable for the following components:   Glucose, Bld 102 (*)    Calcium 8.8 (*)    Total Bilirubin 2.0 (*)    All other components within normal limits  BRAIN NATRIURETIC PEPTIDE - Abnormal; Notable for the following components:   B Natriuretic Peptide 202.9 (*)  All other components within normal limits  I-STAT CHEM 8, ED - Abnormal; Notable for the following components:   Glucose, Bld 104 (*)    Calcium, Ion 0.97 (*)    Hemoglobin 11.9 (*)    HCT 35.0 (*)    All other components within normal limits  DIFFERENTIAL  ETHANOL  URINALYSIS, ROUTINE W REFLEX MICROSCOPIC  TSH  AMMONIA  CBG MONITORING, ED  TROPONIN I (HIGH SENSITIVITY)  TROPONIN I (HIGH SENSITIVITY)    EKG: None ED ECG REPORT   Date: 03/13/2024  Rate: 80  Rhythm: paced  QRS Axis: normal  Intervals: normal  ST/T Wave abnormalities: normal  Conduction Disutrbances:none  Narrative Interpretation:   Old EKG Reviewed: unchanged  I have personally reviewed the EKG tracing and agree with the computerized printout as noted.  Radiology: DG Chest 2 View Result Date: 03/13/2024 CLINICAL DATA:  Altered mental status and hypertension EXAM: CHEST - 2 VIEW COMPARISON:  01/04/2024 FINDINGS: Cardiac shadow is enlarged. Pacing device is again noted and stable. The overall inspiratory effort is poor but stable from the prior exam. No focal infiltrate or effusion is seen. No bony abnormality is noted. IMPRESSION: No active cardiopulmonary disease. Electronically Signed   By: Oneil Devonshire M.D.   On: 03/13/2024 01:16   CT HEAD WO CONTRAST Result Date: 03/12/2024 CLINICAL DATA:  Headache. EXAM: CT HEAD WITHOUT CONTRAST TECHNIQUE: Contiguous axial images were obtained from the base of the skull  through the vertex without intravenous contrast. RADIATION DOSE REDUCTION: This exam was performed according to the departmental dose-optimization program which includes automated exposure control, adjustment of the mA and/or kV according to patient size and/or use of iterative reconstruction technique. COMPARISON:  January 03, 2024 FINDINGS: Brain: There is generalized cerebral atrophy with widening of the extra-axial spaces and ventricular dilatation. There are areas of decreased attenuation within the white matter tracts of the supratentorial brain, consistent with microvascular disease changes. Vascular: No hyperdense vessel or unexpected calcification. Skull: Normal. Negative for fracture or focal lesion. Sinuses/Orbits: Moderate to marked severity right maxillary sinus mucosal thickening is seen with a stable, 12 mm partially calcified area of focal mucosal thickening noted. Marked severity sphenoid sinus mucosal thickening is also noted. Other: None. IMPRESSION: 1. Generalized cerebral atrophy with chronic white matter small vessel ischemic changes. 2. Moderate to marked severity right maxillary sinus and sphenoid sinus disease. Electronically Signed   By: Suzen Dials M.D.   On: 03/12/2024 21:36     Procedures   Medications Ordered in the ED  sodium chloride  flush (NS) 0.9 % injection 3 mL (has no administration in time range)                                    Medical Decision Making Amount and/or Complexity of Data Reviewed Labs: ordered. Decision-making details documented in ED Course. Radiology: ordered and independent interpretation performed. Decision-making details documented in ED Course. ECG/medicine tests: ordered and independent interpretation performed. Decision-making details documented in ED Course.  Risk Prescription drug management. Decision regarding hospitalization.  Slurred speech since 3 PM.  Intermittent episodes of slurred speech for 1 week.  Neurological exam is  nonfocal.  She denies complaints.  No chest pain or breath.  Does have a history of atrial fibrillation with some pacemaker.  Code stroke not activated to resolved symptoms.  CT head in triage negative for hemorrhage or large infarct  Will obtain screening labs, chest  x-ray, urinalysis. Unclear whether her pacemaker is MRI compatible  D/w Dr. Vanessa of neurology.  Does not favor CVA or TIA.  If there were a stroke, it should be seen on CT scan by now as symptoms have been ongoing for the past 1 week.  Recommends consideration of alternative etiologies of slurred speech including metabolic or infectious or polypharmacy.  Recommend CTA head and neck.  Will broaden workup with ammonia, TSH, urinalysis, chest x-ray and labs.  Patient is on multiple sedating medications including gabapentin , Valium , sertraline .  She is back to baseline now.  Concern for polypharmacy.  Pending urinalysis as well as chest x-ray. Unclear whether her pacemaker is compatible with MRI but low suspicion for acute CVA currently.  She appears back to baseline.  CTA is pending as well as urinalysis.  Observation admission discussed with Dr. Charlton.    CTA negative for large vessel occlusion.  Patient states she is going home and does not want to be admitted.  She appears to have capacity to make this decision.  She will leave AGAINST MEDICAL ADVICE.  Daughter at bedside supports her decision.  Patient alert and oriented x 3.  She understands observation admission recommended code stroke is not been ruled out completely and she needs further workup of her slurred speech and altered mental status. Urinalysis still pending. Final diagnoses:  Slurred speech  Acute encephalopathy    ED Discharge Orders     None          Henritta Mutz, Garnette, MD 03/13/24 [id number]    Carita Garnette, MD 03/13/24 [id number]    Carita Garnette, MD 03/21/24 [phone]

## 2024-03-18 ENCOUNTER — Ambulatory Visit: Admitting: Cardiology

## 2024-04-08 ENCOUNTER — Telehealth: Payer: Self-pay | Admitting: Internal Medicine

## 2024-04-08 NOTE — Telephone Encounter (Signed)
*  STAT* If patient is at the pharmacy, call can be transferred to refill team.   1. Which medications need to be refilled? (please list name of each medication and dose if known) apixaban  (ELIQUIS ) 5 MG TABS tablet   2. Which pharmacy/location (including street and city if local pharmacy) is medication to be sent to? [id number] [place], [place] - [address]   3. Do they need a 30 day or 90 day supply? 90 day   Patient will be out of medicine tomorrow 9/9.

## 2024-04-09 ENCOUNTER — Other Ambulatory Visit: Payer: Self-pay

## 2024-04-09 MED ORDER — APIXABAN 5 MG PO TABS: 5.0000 mg | ORAL_TABLET | Freq: Two times a day (BID) | ORAL | 1 refills | Status: AC

## 2024-04-09 MED FILL — Medication: Qty: 1 | Status: AC

## 2024-04-09 NOTE — Telephone Encounter (Signed)
 Prescription refill request for Eliquis  received. Indication:afib Last office visit:7/25 Scr:0.80  8/25 Age: 83 Weight:99.2  kg  Prescription refilled

## 2024-04-11 ENCOUNTER — Ambulatory Visit: Admitting: Cardiology

## 2024-04-30 ENCOUNTER — Other Ambulatory Visit (HOSPITAL_COMMUNITY): Payer: Self-pay

## 2024-05-01 ENCOUNTER — Encounter: Payer: Self-pay | Admitting: Cardiology

## 2024-05-01 ENCOUNTER — Ambulatory Visit: Attending: Cardiology | Admitting: Cardiology

## 2024-05-01 VITALS — BP 128/70 | HR 60 | Ht 67.0 in | Wt 192.4 lb

## 2024-05-01 DIAGNOSIS — Z950 Presence of cardiac pacemaker: Secondary | ICD-10-CM | POA: Diagnosis not present

## 2024-05-01 DIAGNOSIS — R55 Syncope and collapse: Secondary | ICD-10-CM | POA: Diagnosis not present

## 2024-05-01 DIAGNOSIS — I4819 Other persistent atrial fibrillation: Secondary | ICD-10-CM

## 2024-05-01 DIAGNOSIS — R001 Bradycardia, unspecified: Secondary | ICD-10-CM

## 2024-05-01 LAB — CUP PACEART INCLINIC DEVICE CHECK
Date Time Interrogation Session: 20251001152511
Implantable Lead Connection Status: 753985
Implantable Lead Connection Status: 753985
Implantable Lead Implant Date: 20250602
Implantable Lead Implant Date: 20250602
Implantable Lead Location: 753862
Implantable Lead Location: 753862
Implantable Lead Model: 377
Implantable Lead Model: 377
Implantable Lead Serial Number: 8001899805
Implantable Lead Serial Number: 8001918844
Implantable Pulse Generator Implant Date: 20250602
Lead Channel Impedance Value: 508 Ohm
Lead Channel Impedance Value: 605 Ohm
Lead Channel Pacing Threshold Amplitude: 0.6 V
Lead Channel Pacing Threshold Amplitude: 0.8 V
Lead Channel Pacing Threshold Pulse Width: 0.4 ms
Lead Channel Pacing Threshold Pulse Width: 0.4 ms
Lead Channel Sensing Intrinsic Amplitude: 1.6 mV
Lead Channel Sensing Intrinsic Amplitude: 8.9 mV
Pulse Gen Serial Number: 1000510717

## 2024-05-01 NOTE — Patient Instructions (Signed)
 Medication Instructions:  Your physician recommends that you continue on your current medications as directed. Please refer to the Current Medication list given to you today.  *If you need a refill on your cardiac medications before your next appointment, please call your pharmacy*  Follow-Up: At [facility], you and your health needs are our priority.  As part of our continuing mission to provide you with exceptional heart care, our providers are all part of one team.  This team includes your primary Cardiologist (physician) and Advanced Practice Providers or APPs (Physician Assistants and Nurse Practitioners) who all work together to provide you with the care you need, when you need it.  Your next appointment:   1 year(s)  Provider:   Suzann Riddle, NP

## 2024-05-01 NOTE — Progress Notes (Signed)
  Electrophysiology Office Follow up Visit Note:    Date:  05/01/2024   ID:  Barbara Soto, DOB 02-Nov-1940, MRN [id number]  PCP:  Barbara Skates, DO  [facility] Cardiologist:  Barbara DELENA Merck, MD  [facility] Electrophysiologist:  Barbara ONEIDA HOLTS, MD    Interval History:     Barbara Soto is a 83 y.o. female who presents for a follow up visit.   The patient had a pacemaker implanted January 01, 2024 for symptomatic bradycardia due to malignant vasovagal syncope.  She also has atrial fibrillation and takes Eliquis .         Past medical, surgical, social and family history were reviewed.  ROS:   Please see the history of present illness.    All other systems reviewed and are negative.  EKGs/Labs/Other Studies Reviewed:    The following studies were reviewed today:  May 01, 2024 in-clinic device interrogation personally reviewed Battery and lead parameters are stable. Rare ventricular pacing        Physical Exam:    VS:  BP 128/70 (BP Location: Right Arm, Patient Position: Sitting, Cuff Size: Large)   Pulse 60   Ht 5' 7 (1.702 m)   Wt 192 lb 6 oz (87.3 kg)   SpO2 97%   BMI 30.13 kg/m     Wt Readings from Last 3 Encounters:  05/01/24 192 lb 6 oz (87.3 kg)  03/12/24 218 lb 9.6 oz (99.2 kg)  02/21/24 218 lb 9.6 oz (99.2 kg)     GEN: no distress CARD: RRR, No MRG.  Pacemaker pocket well-healed RESP: No IWOB. CTAB.      ASSESSMENT:    1. Persistent atrial fibrillation (HCC)   2. Syncope, unspecified syncope type   3. Symptomatic bradycardia   4. Cardiac pacemaker in situ    PLAN:    In order of problems listed above:  #Symptomatic bradycardia #Vasovagal syncope #Permanent pacemaker in situ Device functioning appropriately.  Continue remote monitoring  #Atrial fibrillation Continue Eliquis  for stroke prophylaxis  Follow-up 1 year with APP.   Signed, Barbara HOLTS, MD, [facility], [facility] 05/01/2024 2:02 PM    Electrophysiology [facility]

## 2024-05-02 ENCOUNTER — Ambulatory Visit: Payer: Self-pay | Admitting: Cardiology

## 2024-05-08 NOTE — Progress Notes (Signed)
 Remote PPM Transmission

## 2024-05-09 DIAGNOSIS — Z1231 Encounter for screening mammogram for malignant neoplasm of breast: Secondary | ICD-10-CM | POA: Diagnosis not present

## 2024-05-09 DIAGNOSIS — Z9181 History of falling: Secondary | ICD-10-CM | POA: Diagnosis not present

## 2024-05-09 DIAGNOSIS — R55 Syncope and collapse: Secondary | ICD-10-CM | POA: Diagnosis not present

## 2024-05-09 DIAGNOSIS — I10 Essential (primary) hypertension: Secondary | ICD-10-CM | POA: Diagnosis not present

## 2024-05-09 DIAGNOSIS — R32 Unspecified urinary incontinence: Secondary | ICD-10-CM | POA: Diagnosis not present

## 2024-05-09 DIAGNOSIS — I482 Chronic atrial fibrillation, unspecified: Secondary | ICD-10-CM | POA: Diagnosis not present

## 2024-05-14 ENCOUNTER — Ambulatory Visit (INDEPENDENT_AMBULATORY_CARE_PROVIDER_SITE_OTHER): Payer: Self-pay

## 2024-05-14 ENCOUNTER — Encounter

## 2024-05-14 DIAGNOSIS — R55 Syncope and collapse: Secondary | ICD-10-CM | POA: Diagnosis not present

## 2024-05-15 LAB — CUP PACEART REMOTE DEVICE CHECK
Battery Voltage: 95
Date Time Interrogation Session: 20251014082342
Implantable Lead Connection Status: 753985
Implantable Lead Connection Status: 753985
Implantable Lead Implant Date: 20250602
Implantable Lead Implant Date: 20250602
Implantable Lead Location: 753862
Implantable Lead Location: 753862
Implantable Lead Model: 377
Implantable Lead Model: 377
Implantable Lead Serial Number: 8001899805
Implantable Lead Serial Number: 8001918844
Implantable Pulse Generator Implant Date: 20250602
Pulse Gen Serial Number: 1000510717

## 2024-05-16 ENCOUNTER — Ambulatory Visit: Payer: Self-pay | Admitting: Cardiology

## 2024-05-16 NOTE — Progress Notes (Signed)
 Remote PPM Transmission

## 2024-05-21 DIAGNOSIS — C44729 Squamous cell carcinoma of skin of left lower limb, including hip: Secondary | ICD-10-CM | POA: Diagnosis not present

## 2024-05-28 DIAGNOSIS — Z48817 Encounter for surgical aftercare following surgery on the skin and subcutaneous tissue: Secondary | ICD-10-CM | POA: Diagnosis not present

## 2024-06-03 DIAGNOSIS — H18513 Endothelial corneal dystrophy, bilateral: Secondary | ICD-10-CM | POA: Diagnosis not present

## 2024-06-03 DIAGNOSIS — H524 Presbyopia: Secondary | ICD-10-CM | POA: Diagnosis not present

## 2024-06-03 DIAGNOSIS — H04123 Dry eye syndrome of bilateral lacrimal glands: Secondary | ICD-10-CM | POA: Diagnosis not present

## 2024-06-03 DIAGNOSIS — H353132 Nonexudative age-related macular degeneration, bilateral, intermediate dry stage: Secondary | ICD-10-CM | POA: Diagnosis not present

## 2024-06-03 DIAGNOSIS — H35373 Puckering of macula, bilateral: Secondary | ICD-10-CM | POA: Diagnosis not present

## 2024-06-04 DIAGNOSIS — Z48817 Encounter for surgical aftercare following surgery on the skin and subcutaneous tissue: Secondary | ICD-10-CM | POA: Diagnosis not present

## 2024-06-12 ENCOUNTER — Encounter (HOSPITAL_BASED_OUTPATIENT_CLINIC_OR_DEPARTMENT_OTHER): Attending: General Surgery | Admitting: General Surgery

## 2024-06-12 DIAGNOSIS — T8131XA Disruption of external operation (surgical) wound, not elsewhere classified, initial encounter: Secondary | ICD-10-CM | POA: Insufficient documentation

## 2024-06-12 DIAGNOSIS — L97822 Non-pressure chronic ulcer of other part of left lower leg with fat layer exposed: Secondary | ICD-10-CM | POA: Diagnosis not present

## 2024-06-12 DIAGNOSIS — X58XXXA Exposure to other specified factors, initial encounter: Secondary | ICD-10-CM | POA: Insufficient documentation

## 2024-06-12 DIAGNOSIS — C44729 Squamous cell carcinoma of skin of left lower limb, including hip: Secondary | ICD-10-CM | POA: Diagnosis not present

## 2024-06-19 ENCOUNTER — Encounter (HOSPITAL_BASED_OUTPATIENT_CLINIC_OR_DEPARTMENT_OTHER): Admitting: General Surgery

## 2024-06-19 DIAGNOSIS — L97822 Non-pressure chronic ulcer of other part of left lower leg with fat layer exposed: Secondary | ICD-10-CM | POA: Diagnosis not present

## 2024-06-19 DIAGNOSIS — T8131XA Disruption of external operation (surgical) wound, not elsewhere classified, initial encounter: Secondary | ICD-10-CM | POA: Diagnosis not present

## 2024-06-20 DIAGNOSIS — I482 Chronic atrial fibrillation, unspecified: Secondary | ICD-10-CM | POA: Diagnosis not present

## 2024-06-20 DIAGNOSIS — R296 Repeated falls: Secondary | ICD-10-CM | POA: Diagnosis not present

## 2024-06-20 DIAGNOSIS — G4700 Insomnia, unspecified: Secondary | ICD-10-CM | POA: Diagnosis not present

## 2024-06-20 DIAGNOSIS — I509 Heart failure, unspecified: Secondary | ICD-10-CM | POA: Diagnosis not present

## 2024-06-20 DIAGNOSIS — I10 Essential (primary) hypertension: Secondary | ICD-10-CM | POA: Diagnosis not present

## 2024-06-20 DIAGNOSIS — D649 Anemia, unspecified: Secondary | ICD-10-CM | POA: Diagnosis not present

## 2024-06-20 DIAGNOSIS — Z85828 Personal history of other malignant neoplasm of skin: Secondary | ICD-10-CM | POA: Diagnosis not present

## 2024-06-20 DIAGNOSIS — L97921 Non-pressure chronic ulcer of unspecified part of left lower leg limited to breakdown of skin: Secondary | ICD-10-CM | POA: Diagnosis not present

## 2024-06-20 DIAGNOSIS — R600 Localized edema: Secondary | ICD-10-CM | POA: Diagnosis not present

## 2024-06-20 DIAGNOSIS — R32 Unspecified urinary incontinence: Secondary | ICD-10-CM | POA: Diagnosis not present

## 2024-07-03 ENCOUNTER — Encounter (HOSPITAL_BASED_OUTPATIENT_CLINIC_OR_DEPARTMENT_OTHER): Attending: General Surgery | Admitting: General Surgery

## 2024-07-03 DIAGNOSIS — T8131XA Disruption of external operation (surgical) wound, not elsewhere classified, initial encounter: Secondary | ICD-10-CM | POA: Diagnosis present

## 2024-07-03 DIAGNOSIS — L97822 Non-pressure chronic ulcer of other part of left lower leg with fat layer exposed: Secondary | ICD-10-CM | POA: Diagnosis not present

## 2024-07-03 DIAGNOSIS — C44729 Squamous cell carcinoma of skin of left lower limb, including hip: Secondary | ICD-10-CM | POA: Insufficient documentation

## 2024-07-03 DIAGNOSIS — Z08 Encounter for follow-up examination after completed treatment for malignant neoplasm: Secondary | ICD-10-CM | POA: Insufficient documentation

## 2024-07-03 DIAGNOSIS — Y838 Other surgical procedures as the cause of abnormal reaction of the patient, or of later complication, without mention of misadventure at the time of the procedure: Secondary | ICD-10-CM | POA: Insufficient documentation

## 2024-07-03 DIAGNOSIS — T8131XD Disruption of external operation (surgical) wound, not elsewhere classified, subsequent encounter: Secondary | ICD-10-CM | POA: Diagnosis not present

## 2024-07-19 ENCOUNTER — Encounter: Payer: Self-pay | Admitting: Internal Medicine

## 2024-08-13 ENCOUNTER — Encounter

## 2024-08-13 ENCOUNTER — Ambulatory Visit: Payer: Self-pay

## 2024-08-13 DIAGNOSIS — R55 Syncope and collapse: Secondary | ICD-10-CM | POA: Diagnosis not present

## 2024-08-14 LAB — CUP PACEART REMOTE DEVICE CHECK
Battery Voltage: 90
Date Time Interrogation Session: 20260113080111
Implantable Lead Connection Status: 753985
Implantable Lead Connection Status: 753985
Implantable Lead Implant Date: 20250602
Implantable Lead Implant Date: 20250602
Implantable Lead Location: 753862
Implantable Lead Location: 753862
Implantable Lead Model: 377
Implantable Lead Model: 377
Implantable Lead Serial Number: 8001899805
Implantable Lead Serial Number: 8001918844
Implantable Pulse Generator Implant Date: 20250602
Pulse Gen Serial Number: 1000510717

## 2024-08-16 NOTE — Progress Notes (Signed)
 Remote PPM Transmission

## 2024-08-22 ENCOUNTER — Ambulatory Visit: Payer: Self-pay | Admitting: Cardiology

## 2024-11-12 ENCOUNTER — Encounter

## 2025-02-11 ENCOUNTER — Encounter

## 2025-05-13 ENCOUNTER — Encounter

## 2025-08-12 ENCOUNTER — Encounter

## 2025-11-11 ENCOUNTER — Encounter
